# Patient Record
Sex: Male | Born: 1950
Health system: Southern US, Community
[De-identification: ages and names within clinical notes are randomized; demographics above are authoritative.]

## PROBLEM LIST (undated history)

## (undated) DIAGNOSIS — A539 Syphilis, unspecified: Secondary | ICD-10-CM

## (undated) DIAGNOSIS — I10 Essential (primary) hypertension: Secondary | ICD-10-CM

## (undated) HISTORY — PX: BACK SURGERY: SHX140

## (undated) HISTORY — DX: Syphilis, unspecified: A53.9

## (undated) HISTORY — DX: Essential (primary) hypertension: I10

---

## 1998-07-05 ENCOUNTER — Encounter: Payer: Self-pay | Admitting: Neurosurgery

## 1998-07-05 ENCOUNTER — Inpatient Hospital Stay (HOSPITAL_COMMUNITY): Admission: RE | Admit: 1998-07-05 | Discharge: 1998-07-06 | Payer: Self-pay | Admitting: Neurosurgery

## 2004-02-23 ENCOUNTER — Other Ambulatory Visit: Payer: Self-pay

## 2006-03-18 ENCOUNTER — Ambulatory Visit: Payer: Self-pay | Admitting: Pain Medicine

## 2006-05-13 ENCOUNTER — Encounter: Admission: RE | Admit: 2006-05-13 | Discharge: 2006-05-13 | Payer: Self-pay | Admitting: Neurosurgery

## 2007-04-28 ENCOUNTER — Emergency Department (HOSPITAL_COMMUNITY): Admission: EM | Admit: 2007-04-28 | Discharge: 2007-04-29 | Payer: Self-pay | Admitting: Emergency Medicine

## 2007-10-20 ENCOUNTER — Inpatient Hospital Stay (HOSPITAL_COMMUNITY): Admission: RE | Admit: 2007-10-20 | Discharge: 2007-10-26 | Payer: Self-pay | Admitting: Neurosurgery

## 2008-01-15 ENCOUNTER — Emergency Department (HOSPITAL_COMMUNITY): Admission: EM | Admit: 2008-01-15 | Discharge: 2008-01-15 | Payer: Self-pay | Admitting: Emergency Medicine

## 2008-11-28 IMAGING — RF DG LUMBAR SPINE 2-3V
1 series · 4 of 4 positions shown · non-contrast
Comparison: CT lumbar spine 05/13/2006

CLINICAL DATA: Lumbar fusion

LUMBAR SPINE - 2-3 VIEW

[Series 1: run · 4 of 4 slices shown]
[im 1/4]
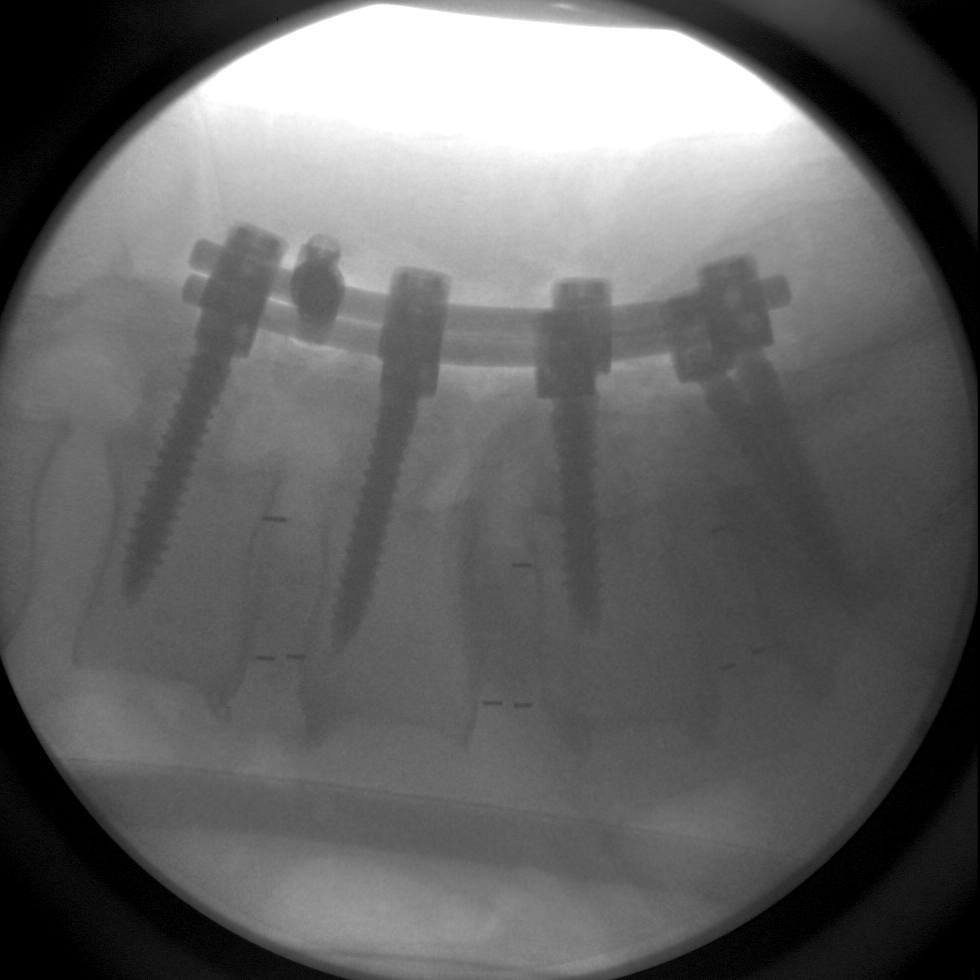
[im 2/4]
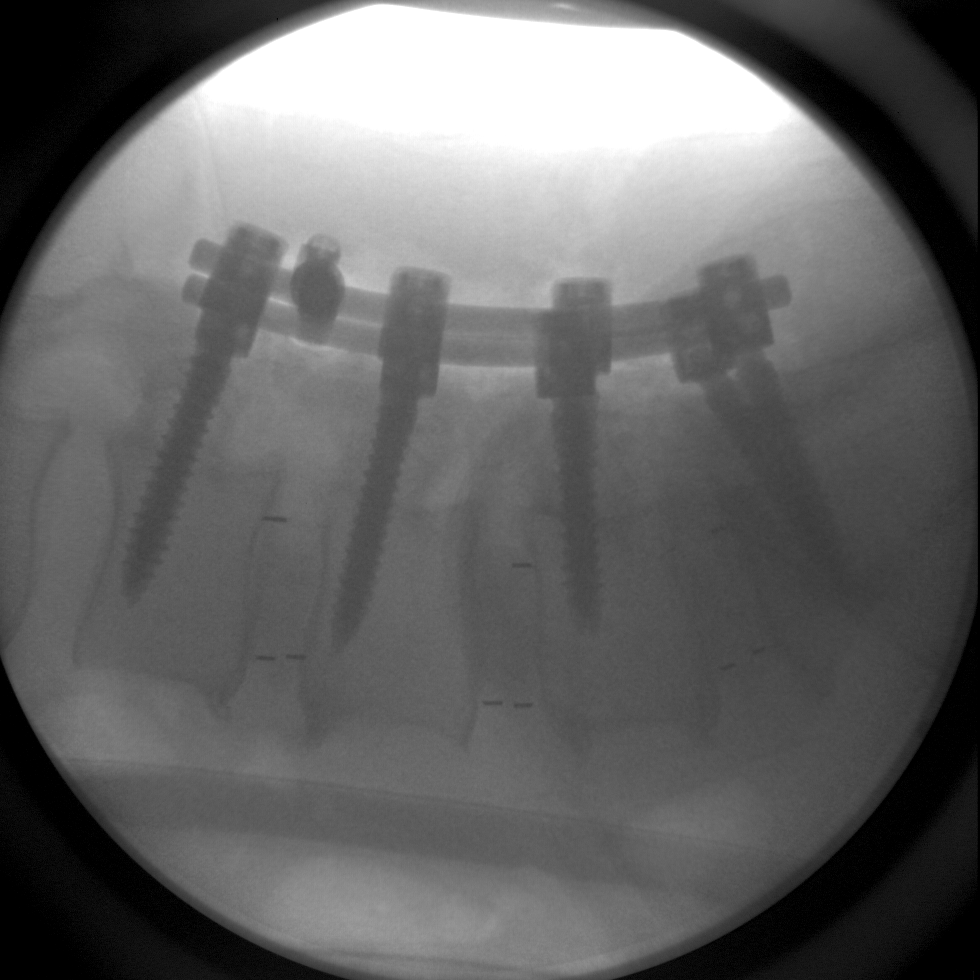
[im 3/4]
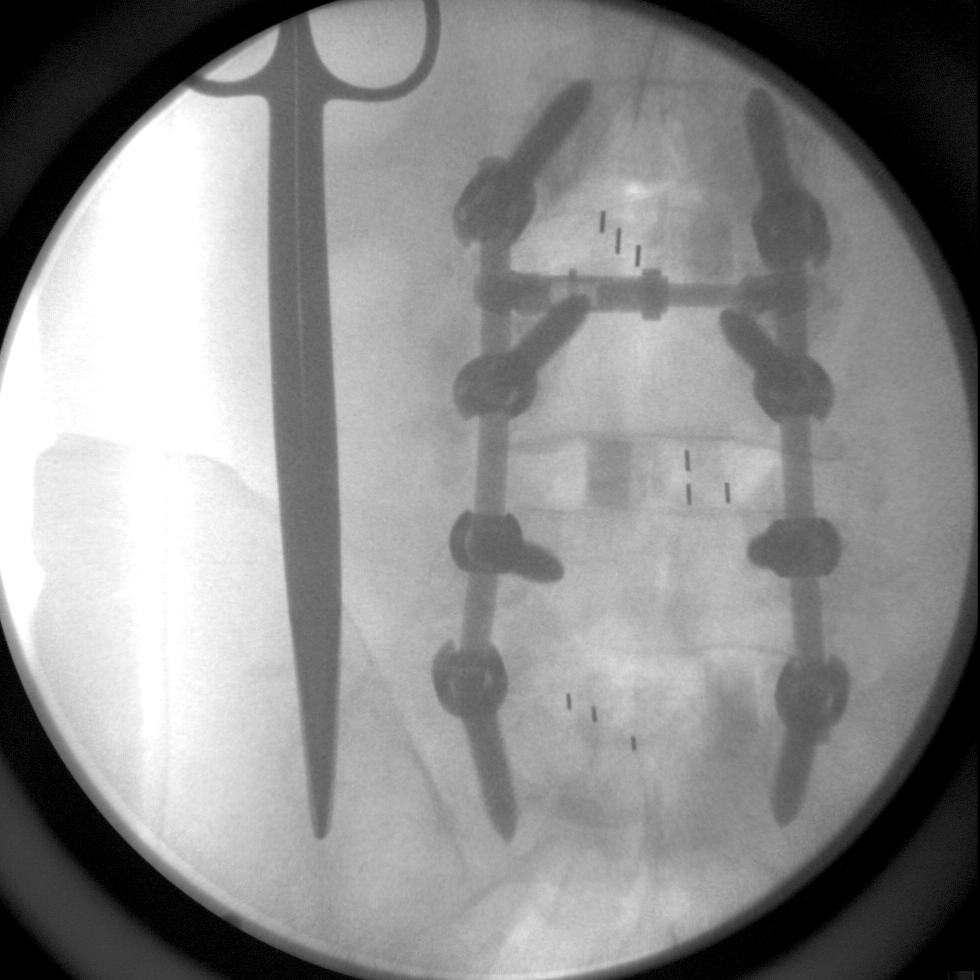
[im 4/4]
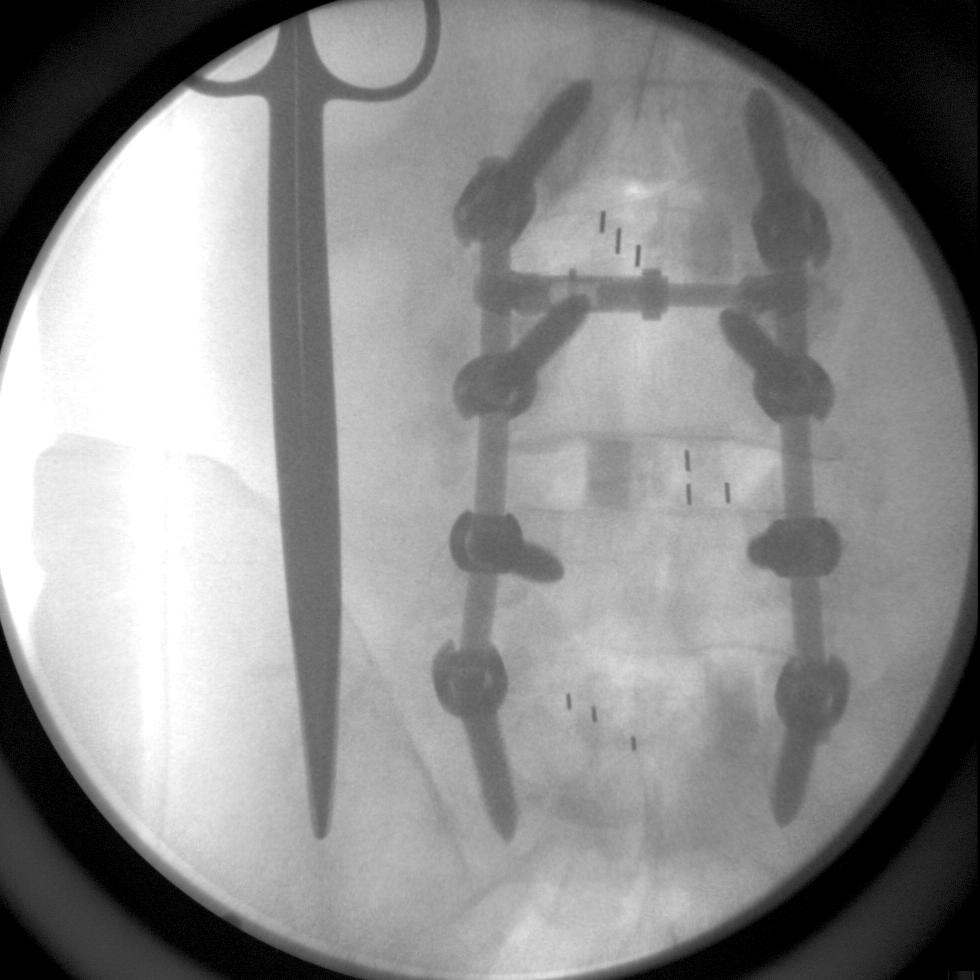

[4 of 4 positions shown; findings below may reference images not displayed]

FINDINGS: There are pedicle screws and posterior rods along with
interbody bone spacers fusing L3-S1.  Normal alignment and no
complicating features.
IMPRESSION: 1.  L3 to S1 fusion.

## 2010-10-09 NOTE — Op Note (Signed)
NAME:  Brandon [name], Brandon [name] NO.:  [id number]   MEDICAL RECORD NO.:  [id number]          PATIENT TYPE:  INP   LOCATION:  3014                         FACILITY:  [facility]   PHYSICIAN:  Kathaleen Maser. Pool, M.D.    DATE OF BIRTH:  [birth year]   DATE OF PROCEDURE:  10/20/2007  DATE OF DISCHARGE:                               OPERATIVE REPORT   PREOPERATIVE DIAGNOSES:  1. L3-4, L4-5, L5-S1 degenerative disk disease with intractable back      pain.  2. Foraminal stenosis.   POSTOPERATIVE DIAGNOSES:  1. L3-4, L4-5, L5-S1 degenerative disk disease with intractable back      pain.  2. Foraminal stenosis.   PROCEDURES:  1. Redo L3-4 decompressive laminectomy and foraminotomies, more than      would be required for simple interbody fusion alone.  2. L4-5 and L5-S1 decompressive laminectomies and foraminotomies, more      than would be required for simple interbody fusion alone.  3. L3-4, L4-5 and L5-S1 posterior lumbar fusion of the Tangent      interbody allograft wedge, interbody PEEK cage and local      autografting.  4. L3-S1 posterolateral arthrodesis utilizing segmental pedicle screw      instrumentation and local autografting.   SURGEON:  Kathaleen Maser. Pool, MD   ASSISTANT:  Reinaldo Meeker, MD   ANESTHESIA:  General endotracheal.   INDICATIONS:  Mr. Cloward is a 60 year old male with history of severe  back and bilateral lower extremity pain failing all conservative  management.  Workup demonstrates evidence of previous surgery on the  patient's left side at L3-4.  The patient has marked disk degeneration  with a central disk herniation at L4-5 and marked disk degeneration and  foraminal stenosis at L4-5 and L5-S1.  The patient has had provocative  diskography which caused at both the L4-5 and L5-S1 levels.  The patient  presents now for L3-S1 decompression and fusion for instrumentation.   OPERATIVE NOTE:  The patient was taken to the operating room, placed on  the  operating table in supine position.  After an adequate level of  anesthesia achieved, the patient placed prone onto Wilson frame.  Appropriately padded the patient's lumbar region was prepped and draped  sterilely.  A 10 blade was used to make a curvilinear skin incision  extending from L2 down to the sacrum.  This was carried down sharply in  midline.  Subperiosteal dissection was then performed exposing the  lamina and facet joints of L2, L3, L4, L5 and the sacrum, as well as the  transverse processes of L3, L4 and L5 and the sacral ala bilaterally.  Deep self-retaining retractor was placed.  Intraoperative fluoroscopy  was used and levels were confirmed.  Decompressive laminectomy was then  performed using the high-speed drill, Kerrison rongeurs and the Leksell  rongeur to completely remove the lamina of L3, completely remove the  lamina of L4 and completely remove the lamina of L5.  Inferior  facetectomies were performed bilaterally at L3, L4 and L5 bilaterally.  Superior facetectomies were performed bilaterally at L4, L5 and S1  bilaterally.  All  bone was cleaned and used in later autografting.  Ligamentum flavum and epidural scar were then resected in piecemeal  fashion.  Wide decompressive foraminotomies were then performed along  the course of exiting L3, L4, L5 and S1 nerve roots bilaterally.  At  this point, a very thorough incision had been achieved.  There was no  evidence of residual foraminal stenosis.  Epidural venous plexus  coagulated and cut.  Starting first, the patient's left side at L3-4,  the patient's thecal sac and nerve roots were gently mobilized and  tracked towards midline.  Epidural scar was dissected free.  Diskectomy  was then performed using pituitary rongeurs, upbiting pituitary rongeurs  and Epstein curettes.  Procedure was then repeated on the contralateral  side and then repeated at L4-5 and L5-S1 without problem.  Disk space  distractor was then placed  at the L3-4 level on the patient's right  side.  A 10 mm distractor was placed.  Disk space was then reamed on the  contralateral side using a 10 mm tangent box cutter and a 10 mm tangent  chisel.  Soft tissues removed from the interspace.  A 10 x 26 mm tangent  wedge was then packed into place and recessed approximately 2 mm from  posterior cortical margin of L3.  The distractor was removed from the  patient's left side.  Thecal sac nerve was inspected on the right side.  Disk space was then reamed and cut with 10 mm  tangent instrument.  Soft  tissues then removed from the interspace and disk space was further  curettaged.  Morselized autografts were impacted in for later fusion.  A  10 x 22 mm Telamon cage was then impacted into place.  This cage was  packed with morselized autograft and Progenix Putty .  This cage was  then recessed proximally 2 mm from the posterior cortical margin.  The  procedure was then repeated at L4-5 and L5-S1 again without  complications.  Pedicle was then identified using surface landmarks with  intraoperative fluoroscopy.  The superficial bone overlying the pedicle  was then removed using high-speed drill.  Each pedicle was then probed  using pedicle awl.  The pedicle awl track was then tapped with 5.25-mm  screw tapper.  Each screw tap hole was probed and found to be solid  bone.  A 6.75 x 45 mm radius screws were placed bilaterally at L3-L4, a  6.75 x 40 mm screws were placed bilaterally at L5-S1.  All screws were  well positioned by intraoperative fluoroscopy in transverse processes  and sacral ala, then decorticated with high speed drill.  Morselized  autograft was packed posterolaterally for later fusion bilaterally.  Short segment titanium rods were then contoured and placed over the  screw heads at L3, L4, L5 and S1.  Locking caps were placed over the  screw heads.  Locking caps were then engaged with construct under  compression.  Final images  revealed good position of the bone grafts and  hardware with proper operative level and normal alignment of the spine.  A transverse connector was also placed.  Wound was irrigated one final  time.  Gelfoam was placed topically and hemostasis was found be good.  A  medium Hemovac drain was left in interspace.  The wound was then closed  in layers with Vicryl suture.  Steri-Strips and sterile dressings were  applied.  There were no complications.  The patient tolerated the  procedure well and he returned to  the recovery room postoperatively.           ______________________________  Kathaleen Maser Pool, M.D.     HAP/MEDQ  D:  10/20/2007  T:  10/21/2007  Job:  [id number]

## 2010-10-12 NOTE — Discharge Summary (Signed)
NAME:  Brandon [name], Brandon [name] NO.:  [id number]   MEDICAL RECORD NO.:  [id number]          PATIENT TYPE:  INP   LOCATION:  3014                         FACILITY:  [facility]   PHYSICIAN:  Kathaleen Maser. Pool, M.D.    DATE OF BIRTH:  31-Dec-1950   DATE OF ADMISSION:  10/20/2007  DATE OF DISCHARGE:  10/26/2007                               DISCHARGE SUMMARY   FINAL DIAGNOSIS:  L3-4, L4-5, and L5-S1 degenerative disk disease with  stenosis.   OPERATION AND TREATMENTS:  L3 through S1 decompression and fusion  instrumentation.   HOSPITAL COURSE:  The patient was admitted to the hospital for treatment  of his degenerative disk disease and stenosis from L3 to S1.  The  patient underwent uncomplicated decompression and fusion surgery.  Postoperative pain was significant.  Neurological exam was intact.  The  patient was slowly mobilized.  At the time of discharge, the patient is  up ambulating without difficulty.  His preoperative pain is improved.  His neurological exam is intact.  Wound is healing well.  He will be  discharged home.  He will follow up my office in 1 week.           ______________________________  Kathaleen Maser. Pool, M.D.     HAP/MEDQ  D:  11/17/2007  T:  11/18/2007  Job:  [id number]

## 2011-02-20 LAB — CBC
HCT: 44.1
Hemoglobin: 14.6
Platelets: 236
WBC: 6.2

## 2011-02-20 LAB — TYPE AND SCREEN: ABO/RH(D): O POS

## 2011-02-20 LAB — DIFFERENTIAL
Eosinophils Relative: 1
Lymphocytes Relative: 33
Lymphs Abs: 2.1

## 2011-02-20 LAB — ABO/RH: ABO/RH(D): O POS

## 2011-03-04 LAB — I-STAT 8, (EC8 V) (CONVERTED LAB)
BUN: 11
Chloride: 110
HCT: 40
Hemoglobin: 13.6
Operator id: 282201
Potassium: 4.5
Sodium: 141

## 2011-03-04 LAB — POCT I-STAT CREATININE
Creatinine, Ser: 1.1
Operator id: 282201

## 2011-03-04 LAB — DIFFERENTIAL
Eosinophils Absolute: 0.1 — ABNORMAL LOW
Eosinophils Relative: 1
Lymphs Abs: 1.7
Monocytes Absolute: 0.6
Monocytes Relative: 9

## 2011-03-04 LAB — POCT CARDIAC MARKERS: Operator id: 282201

## 2011-03-04 LAB — CBC
HCT: 37.5 — ABNORMAL LOW
MCV: 89.4
Platelets: 240
WBC: 6.4

## 2011-12-12 ENCOUNTER — Other Ambulatory Visit (HOSPITAL_COMMUNITY): Payer: Self-pay | Admitting: Family Medicine

## 2011-12-12 ENCOUNTER — Ambulatory Visit (HOSPITAL_COMMUNITY)
Admission: RE | Admit: 2011-12-12 | Discharge: 2011-12-12 | Disposition: A | Discharge status: Home / Self Care | Payer: Medicare Other | Source: Ambulatory Visit | Attending: Family Medicine | Admitting: Family Medicine

## 2011-12-12 DIAGNOSIS — F17200 Nicotine dependence, unspecified, uncomplicated: Secondary | ICD-10-CM

## 2011-12-12 DIAGNOSIS — I10 Essential (primary) hypertension: Secondary | ICD-10-CM | POA: Insufficient documentation

## 2011-12-18 ENCOUNTER — Encounter (INDEPENDENT_AMBULATORY_CARE_PROVIDER_SITE_OTHER): Payer: Self-pay | Admitting: *Deleted

## 2011-12-30 ENCOUNTER — Encounter (INDEPENDENT_AMBULATORY_CARE_PROVIDER_SITE_OTHER): Payer: Self-pay | Admitting: Internal Medicine

## 2011-12-30 ENCOUNTER — Other Ambulatory Visit (INDEPENDENT_AMBULATORY_CARE_PROVIDER_SITE_OTHER): Payer: Self-pay | Admitting: *Deleted

## 2011-12-30 ENCOUNTER — Ambulatory Visit (INDEPENDENT_AMBULATORY_CARE_PROVIDER_SITE_OTHER): Payer: Medicare Other | Admitting: Internal Medicine

## 2011-12-30 ENCOUNTER — Telehealth (INDEPENDENT_AMBULATORY_CARE_PROVIDER_SITE_OTHER): Payer: Self-pay | Admitting: *Deleted

## 2011-12-30 VITALS — BP 110/80 | HR 72 | Temp 98.4°F | Ht 76.0 in | Wt 224.2 lb

## 2011-12-30 DIAGNOSIS — K625 Hemorrhage of anus and rectum: Secondary | ICD-10-CM

## 2011-12-30 DIAGNOSIS — Z1211 Encounter for screening for malignant neoplasm of colon: Secondary | ICD-10-CM

## 2011-12-30 MED ORDER — PEG-KCL-NACL-NASULF-NA ASC-C 100 G PO SOLR: 1.0000 | Freq: Once | ORAL | Status: DC

## 2011-12-30 NOTE — Progress Notes (Signed)
Subjective:     Patient ID: Brandon [name], male   DOB: Jul 31, 1950, 61 y.o.   MRN: [id number]  HPI Referral from Dr. Renard Matter for rectal bleeding/colonoscopy.  He has had rectal bleeding all his life. He says sometimes the bleeding is heavy and sometimes it not. He sees blood in his stool and on the toilet tissue. Blood is bright red. He says Dr. Renard Matter did a rectal and he was positive for blood.  There has been no weight loss. Appetite is good. No abdominal pain.  He has a BM x 1 a day. Stools are brown and normal caliber. Sometimes he has pain with his BMs.  Sometimes his stools are hard. He has never undergone a colonoscopy. 11/30/2011: H and H 15.4 and 45.0, Platelet ct 264, K 5.1, NA 142, Chloride 103, glucose 84, BUN 6, Creatinine 1.03, ALP 97, AT 20, ALT 20, Total protein 7.3, Albumin 4.2. Direct bili 0.1, Indirect 0.1.  Review of Systems see hpi No current outpatient prescriptions on file.   History   Social History  . Marital Status: Married    Spouse Name: N/A    Number of Children: N/A  . Years of Education: N/A   Occupational History  . Not on file.   Social History Main Topics  . Smoking status: Current Everyday Smoker  . Smokeless tobacco: Not on file   Comment: 1 pack a day  . Alcohol Use: No  . Drug Use: No  . Sexually Active: Not on file   Other Topics Concern  . Not on file   Social History Narrative  . No narrative on file   History reviewed. No pertinent past medical history. Family Status  Relation Status Death Age  . Mother Alive     amputee, dialysis, cancer  . Father Deceased     MI  . Brother Alive     HTN,  one had syphyllis   No Known Allergies      Objective:   Physical ExamAlert and oriented. Skin warm and dry. Oral mucosa is moist.   . Sclera anicteric, conjunctivae is pink. Thyroid not enlarged. No cervical lymphadenopathy. Lungs clear. Heart regular rate and rhythm.  Abdomen is soft. Bowel sounds are positive. No hepatomegaly. No  abdominal masses felt. No tenderness.  No edema to lower extremities.  Stool negative for blood. Filed Vitals:   12/30/11 1525  Height: 6\' 4"  (1.93 m)  Weight: 224 lb 3.2 oz (101.696 kg)       Assessment:    Rectal bleeding.  Colonic neoplasm needs to be ruled out as well as polyps.    Plan:   Colonoscopy.The risks and benefits such as perforation, bleeding, and infection were reviewed with the patient and is agreeable.

## 2011-12-30 NOTE — Telephone Encounter (Signed)
Patient needs movi prep 

## 2011-12-30 NOTE — Patient Instructions (Addendum)
Colonoscopy with Dr. Rehman. The risks and benefits such as perforation, bleeding, and infection were reviewed with the patient and is agreeable. 

## 2012-01-07 ENCOUNTER — Encounter (HOSPITAL_COMMUNITY): Payer: Self-pay | Admitting: Pharmacy Technician

## 2012-01-21 MED ORDER — SODIUM CHLORIDE 0.45 % IV SOLN
Freq: Once | INTRAVENOUS | Status: AC
  Administered 2012-01-22: 13:00:00 via INTRAVENOUS

## 2012-01-22 ENCOUNTER — Encounter (HOSPITAL_COMMUNITY)
Admission: RE | Disposition: A | Discharge status: Home / Self Care | Payer: Self-pay | Source: Ambulatory Visit | Attending: Internal Medicine

## 2012-01-22 ENCOUNTER — Ambulatory Visit (HOSPITAL_COMMUNITY)
Admission: RE | Admit: 2012-01-22 | Discharge: 2012-01-22 | Disposition: A | Discharge status: Home / Self Care | Payer: Medicare Other | Source: Ambulatory Visit | Attending: Internal Medicine | Admitting: Internal Medicine

## 2012-01-22 ENCOUNTER — Encounter (HOSPITAL_COMMUNITY): Payer: Self-pay | Admitting: *Deleted

## 2012-01-22 DIAGNOSIS — K644 Residual hemorrhoidal skin tags: Secondary | ICD-10-CM | POA: Insufficient documentation

## 2012-01-22 DIAGNOSIS — K921 Melena: Secondary | ICD-10-CM | POA: Insufficient documentation

## 2012-01-22 DIAGNOSIS — K625 Hemorrhage of anus and rectum: Secondary | ICD-10-CM

## 2012-01-22 DIAGNOSIS — K648 Other hemorrhoids: Secondary | ICD-10-CM

## 2012-01-22 HISTORY — PX: COLONOSCOPY: SHX5424

## 2012-01-22 SURGERY — COLONOSCOPY
Anesthesia: Moderate Sedation

## 2012-01-22 MED ORDER — MEPERIDINE HCL 50 MG/ML IJ SOLN
INTRAMUSCULAR | Status: AC
  Filled 2012-01-22: qty 1

## 2012-01-22 MED ORDER — PSYLLIUM 28 % PO PACK: 1.0000 | PACK | Freq: Every day | ORAL | Status: AC

## 2012-01-22 MED ORDER — MEPERIDINE HCL 50 MG/ML IJ SOLN
INTRAMUSCULAR | Status: DC | PRN
  Administered 2012-01-22 (×2): 25 mg via INTRAVENOUS

## 2012-01-22 MED ORDER — MIDAZOLAM HCL 5 MG/5ML IJ SOLN
INTRAMUSCULAR | Status: DC | PRN
  Administered 2012-01-22: 2 mg via INTRAVENOUS
  Administered 2012-01-22: 1 mg via INTRAVENOUS
  Administered 2012-01-22: 2 mg via INTRAVENOUS

## 2012-01-22 MED ORDER — HYDROCORTISONE ACETATE 25 MG RE SUPP: 25.0000 mg | Freq: Every day | RECTAL | Status: AC

## 2012-01-22 MED ORDER — MIDAZOLAM HCL 5 MG/5ML IJ SOLN
INTRAMUSCULAR | Status: AC
  Filled 2012-01-22: qty 10

## 2012-01-22 MED ORDER — STERILE WATER FOR IRRIGATION IR SOLN
Status: DC | PRN
  Administered 2012-01-22: 14:00:00

## 2012-01-22 NOTE — Op Note (Signed)
COLONOSCOPY PROCEDURE REPORT  PATIENT:  Brandon [name]  MR#:  [id number] Birthdate:  [birth year], 61 y.o., male Endoscopist:  Dr. Malissa Hippo, MD Referred By:  Dr. Alice Reichert, MD Procedure Date: 01/22/2012  Procedure:   Colonoscopy  Indications: Patient is 61 year old African American male who has  been experiencing intermittent rectal bleeding for the last 6 months.  Informed Consent:  The procedure and risks were reviewed with the patient and informed consent was obtained.  Medications:  Demerol 50 mg IV Versed 5 mg IV  Description of procedure:  After a digital rectal exam was performed, that colonoscope was advanced from the anus through the rectum and colon to the area of the cecum, ileocecal valve and appendiceal orifice. The cecum was deeply intubated. These structures were well-seen and photographed for the record. From the level of the cecum and ileocecal valve, the scope was slowly and cautiously withdrawn. The mucosal surfaces were carefully surveyed utilizing scope tip to flexion to facilitate fold flattening as needed. The scope was pulled down into the rectum where a thorough exam including retroflexion was performed.  Findings:   Prep excellent. Normal mucosa throughout. Normal rectal mucosa. Hemorrhoids above and below the dentate line.  Therapeutic/Diagnostic Maneuvers Performed:  None  Complications:  None  Cecal Withdrawal Time:  21  minutes  Impression:  Normal colonoscopy except internal/external hemorrhoids felt to be source of patient's recurrent rectal bleeding.   Recommendations:  High fiber diet. Metamucil one tablespoonful by mouth daily. Anusol-HC suppository 1 per rectum daily at bedtime for two weeks. Office visit in 3 months   REHMAN,NAJEEB U  01/22/2012 2:26 PM  CC: Dr. Alice Reichert, MD & Dr. Bonnetta Barry ref. provider found

## 2012-01-22 NOTE — H&P (Signed)
Brandon [name] is an 61 y.o. male.   Chief Complaint: Patient is here for colonoscopy. HPI: Patient is 61 year old African male who's been having intermittent rectal bleeding for about 6 months. He passes blood mostly with his bowel movements at times without. He denies diarrhea constipation or abdominal pain. He has good appetite and denies weight loss. Family history is negative for colorectal carcinoma to  Past Medical History  Diagnosis Date  . No pertinent past medical history     Past Surgical History  Procedure Date  . Back surgery     4 yrs ago    Family History  Problem Relation Age of Onset  . Colon cancer Neg Hx    Social History:  reports that he has been smoking Cigarettes.  He has a 45 pack-year smoking history. He does not have any smokeless tobacco history on file. He reports that he does not drink alcohol or use illicit drugs.  Allergies: No Known Allergies  Medications Prior to Admission  Medication Sig Dispense Refill  . peg 3350 powder (MOVIPREP) 100 G SOLR Take 1 kit (100 g total) by mouth once.  1 kit  0    No results found for this or any previous visit (from the past 48 hour(s)). No results found.  ROS  Blood pressure 140/87, pulse 67, temperature 98.4 F (36.9 C), temperature source Oral, resp. rate 19, height 6\' 4"  (1.93 m), weight 224 lb (101.606 kg), SpO2 94.00%. Physical Exam  Constitutional: He appears well-developed and well-nourished.  HENT:  Mouth/Throat: Oropharynx is clear and moist.  Eyes: Conjunctivae are normal. No scleral icterus.  Neck: No thyromegaly present.  Cardiovascular: Normal rate, regular rhythm and normal heart sounds.   No murmur heard. Respiratory: Effort normal.  GI: Soft. He exhibits no distension and no mass. There is no tenderness.  Musculoskeletal: He exhibits no edema.  Lymphadenopathy:    He has no cervical adenopathy.  Neurological: He is alert.  Skin: Skin is warm and dry.      Assessment/Plan Rectal bleeding. Diagnostic colonoscopy.  Elva Breaker U 01/22/2012, 1:42 PM

## 2012-01-24 ENCOUNTER — Encounter (HOSPITAL_COMMUNITY): Payer: Self-pay | Admitting: Internal Medicine

## 2012-01-31 ENCOUNTER — Encounter (INDEPENDENT_AMBULATORY_CARE_PROVIDER_SITE_OTHER): Payer: Self-pay

## 2012-04-20 ENCOUNTER — Ambulatory Visit (INDEPENDENT_AMBULATORY_CARE_PROVIDER_SITE_OTHER): Payer: Medicare Other | Admitting: Internal Medicine

## 2017-08-14 ENCOUNTER — Encounter: Payer: Self-pay | Admitting: Family Medicine

## 2017-08-14 ENCOUNTER — Other Ambulatory Visit (HOSPITAL_COMMUNITY)
Admission: RE | Admit: 2017-08-14 | Discharge: 2017-08-14 | Disposition: A | Discharge status: Home / Self Care | Payer: Medicare Other | Source: Ambulatory Visit | Attending: Family Medicine | Admitting: Family Medicine

## 2017-08-14 ENCOUNTER — Other Ambulatory Visit: Payer: Self-pay

## 2017-08-14 ENCOUNTER — Ambulatory Visit (INDEPENDENT_AMBULATORY_CARE_PROVIDER_SITE_OTHER): Payer: Medicare Other | Admitting: Family Medicine

## 2017-08-14 VITALS — BP 150/94 | HR 69 | Temp 97.9°F | Resp 16 | Ht 76.0 in | Wt 225.2 lb

## 2017-08-14 DIAGNOSIS — Z1211 Encounter for screening for malignant neoplasm of colon: Secondary | ICD-10-CM

## 2017-08-14 DIAGNOSIS — Z23 Encounter for immunization: Secondary | ICD-10-CM

## 2017-08-14 DIAGNOSIS — R634 Abnormal weight loss: Secondary | ICD-10-CM

## 2017-08-14 DIAGNOSIS — R030 Elevated blood-pressure reading, without diagnosis of hypertension: Secondary | ICD-10-CM

## 2017-08-14 DIAGNOSIS — R399 Unspecified symptoms and signs involving the genitourinary system: Secondary | ICD-10-CM | POA: Insufficient documentation

## 2017-08-14 DIAGNOSIS — Z113 Encounter for screening for infections with a predominantly sexual mode of transmission: Secondary | ICD-10-CM

## 2017-08-14 DIAGNOSIS — Z720 Tobacco use: Secondary | ICD-10-CM

## 2017-08-14 DIAGNOSIS — R630 Anorexia: Secondary | ICD-10-CM

## 2017-08-14 DIAGNOSIS — R5383 Other fatigue: Secondary | ICD-10-CM | POA: Diagnosis not present

## 2017-08-14 DIAGNOSIS — R6881 Early satiety: Secondary | ICD-10-CM | POA: Diagnosis not present

## 2017-08-14 DIAGNOSIS — Z125 Encounter for screening for malignant neoplasm of prostate: Secondary | ICD-10-CM | POA: Diagnosis not present

## 2017-08-14 NOTE — Patient Instructions (Signed)
Get your labs done High BP is BP greater than 140/90-Call if elevated on consecutive days Check BP and bring readings and cuff to your next visit. Follow up in 2 weeks. Cut down on your salt intake DASH Eating Plan DASH stands for "Dietary Approaches to Stop Hypertension." The DASH eating plan is a healthy eating plan that has been shown to reduce high blood pressure (hypertension). It may also reduce your risk for type 2 diabetes, heart disease, and stroke. The DASH eating plan may also help with weight loss. What are tips for following this plan? General guidelines  Avoid eating more than 2,300 mg (milligrams) of salt (sodium) a day. If you have hypertension, you may need to reduce your sodium intake to 1,500 mg a day.  Limit alcohol intake to no more than 1 drink a day for nonpregnant women and 2 drinks a day for men. One drink equals 12 oz of beer, 5 oz of wine, or 1 oz of hard liquor.  Work with your health care provider to maintain a healthy body weight or to lose weight. Ask what an ideal weight is for you.  Get at least 30 minutes of exercise that causes your heart to beat faster (aerobic exercise) most days of the week. Activities may include walking, swimming, or biking.  Work with your health care provider or diet and nutrition specialist (dietitian) to adjust your eating plan to your individual calorie needs. Reading food labels  Check food labels for the amount of sodium per serving. Choose foods with less than 5 percent of the Daily Value of sodium. Generally, foods with less than 300 mg of sodium per serving fit into this eating plan.  To find whole grains, look for the word "whole" as the first word in the ingredient list. Shopping  Buy products labeled as "low-sodium" or "no salt added."  Buy fresh foods. Avoid canned foods and premade or frozen meals. Cooking  Avoid adding salt when cooking. Use salt-free seasonings or herbs instead of table salt or sea salt. Check  with your health care provider or pharmacist before using salt substitutes.  Do not fry foods. Cook foods using healthy methods such as baking, boiling, grilling, and broiling instead.  Cook with heart-healthy oils, such as olive, canola, soybean, or sunflower oil. Meal planning   Eat a balanced diet that includes: ? 5 or more servings of fruits and vegetables each day. At each meal, try to fill half of your plate with fruits and vegetables. ? Up to 6-8 servings of whole grains each day. ? Less than 6 oz of lean meat, poultry, or fish each day. A 3-oz serving of meat is about the same size as a deck of cards. One egg equals 1 oz. ? 2 servings of low-fat dairy each day. ? A serving of nuts, seeds, or beans 5 times each week. ? Heart-healthy fats. Healthy fats called Omega-3 fatty acids are found in foods such as flaxseeds and coldwater fish, like sardines, salmon, and mackerel.  Limit how much you eat of the following: ? Canned or prepackaged foods. ? Food that is high in trans fat, such as fried foods. ? Food that is high in saturated fat, such as fatty meat. ? Sweets, desserts, sugary drinks, and other foods with added sugar. ? Full-fat dairy products.  Do not salt foods before eating.  Try to eat at least 2 vegetarian meals each week.  Eat more home-cooked food and less restaurant, buffet, and fast food.  When eating at a restaurant, ask that your food be prepared with less salt or no salt, if possible. What foods are recommended? The items listed may not be a complete list. Talk with your dietitian about what dietary choices are best for you. Grains Whole-grain or whole-wheat bread. Whole-grain or whole-wheat pasta. Brown rice. [place]. Bulgur. Whole-grain and low-sodium cereals. Pita bread. Low-fat, low-sodium crackers. Whole-wheat flour tortillas. Vegetables Fresh or frozen vegetables (raw, steamed, roasted, or grilled). Low-sodium or reduced-sodium tomato and  vegetable juice. Low-sodium or reduced-sodium tomato sauce and tomato paste. Low-sodium or reduced-sodium canned vegetables. Fruits All fresh, dried, or frozen fruit. Canned fruit in natural juice (without added sugar). Meat and other protein foods Skinless chicken or [place]. Ground chicken or [place]. Pork with fat trimmed off. Fish and seafood. Egg whites. Dried beans, peas, or lentils. Unsalted nuts, nut butters, and seeds. Unsalted canned beans. Lean cuts of beef with fat trimmed off. Low-sodium, lean deli meat. Dairy Low-fat (1%) or fat-free (skim) milk. Fat-free, low-fat, or reduced-fat cheeses. Nonfat, low-sodium ricotta or cottage cheese. Low-fat or nonfat yogurt. Low-fat, low-sodium cheese. Fats and oils Soft margarine without trans fats. Vegetable oil. Low-fat, reduced-fat, or light mayonnaise and salad dressings (reduced-sodium). Canola, safflower, olive, soybean, and sunflower oils. Avocado. Seasoning and other foods Herbs. Spices. Seasoning mixes without salt. Unsalted popcorn and pretzels. Fat-free sweets. What foods are not recommended? The items listed may not be a complete list. Talk with your dietitian about what dietary choices are best for you. Grains Baked goods made with fat, such as croissants, muffins, or some breads. Dry pasta or rice meal packs. Vegetables Creamed or fried vegetables. Vegetables in a cheese sauce. Regular canned vegetables (not low-sodium or reduced-sodium). Regular canned tomato sauce and paste (not low-sodium or reduced-sodium). Regular tomato and vegetable juice (not low-sodium or reduced-sodium). Angie Fava. Olives. Fruits Canned fruit in a light or heavy syrup. Fried fruit. Fruit in cream or butter sauce. Meat and other protein foods Fatty cuts of meat. Ribs. Fried meat. Berniece Salines. Sausage. Bologna and other processed lunch meats. Salami. Fatback. Hotdogs. Bratwurst. Salted nuts and seeds. Canned beans with added salt. Canned or smoked fish. Whole eggs or  egg yolks. Chicken or [place] with skin. Dairy Whole or 2% milk, cream, and half-and-half. Whole or full-fat cream cheese. Whole-fat or sweetened yogurt. Full-fat cheese. Nondairy creamers. Whipped toppings. Processed cheese and cheese spreads. Fats and oils Butter. Stick margarine. Lard. Shortening. Ghee. Bacon fat. Tropical oils, such as coconut, palm kernel, or palm oil. Seasoning and other foods Salted popcorn and pretzels. Onion salt, garlic salt, seasoned salt, table salt, and sea salt. Worcestershire sauce. Tartar sauce. Barbecue sauce. Teriyaki sauce. Soy sauce, including reduced-sodium. Steak sauce. Canned and packaged gravies. Fish sauce. Oyster sauce. Cocktail sauce. Horseradish that you find on the shelf. Ketchup. Mustard. Meat flavorings and tenderizers. Bouillon cubes. Hot sauce and Tabasco sauce. Premade or packaged marinades. Premade or packaged taco seasonings. Relishes. Regular salad dressings. Where to find more information:  National Heart, Lung, and [place]: https://wilson-eaton.com/  American Heart Association: www.heart.org Summary  The DASH eating plan is a healthy eating plan that has been shown to reduce high blood pressure (hypertension). It may also reduce your risk for type 2 diabetes, heart disease, and stroke.  With the DASH eating plan, you should limit salt (sodium) intake to 2,300 mg a day. If you have hypertension, you may need to reduce your sodium intake to 1,500 mg a day.  When on the DASH eating plan,  aim to eat more fresh fruits and vegetables, whole grains, lean proteins, low-fat dairy, and heart-healthy fats.  Work with your health care provider or diet and nutrition specialist (dietitian) to adjust your eating plan to your individual calorie needs. This information is not intended to replace advice given to you by your health care provider. Make sure you discuss any questions you have with your health care provider. Document Released: 05/02/2011  Document Revised: 05/06/2016 Document Reviewed: 05/06/2016 Elsevier Interactive Patient Education  [employer].

## 2017-08-14 NOTE — Progress Notes (Signed)
Patient ID: Brandon [name], male    DOB: Jan 09, 1951, 67 y.o.   MRN: [id number]  Chief Complaint  Patient presents with  . Fatigue  . Anorexia  . Urinary Urgency    Allergies Patient has no known allergies.  Subjective:   Brandon [name] is a 67 y.o. male who presents to [facility] today.  HPI Here b/c has not felt good. Reports that has had a decreased appetite over the past six months.  He reports that he can tell he has lost weight because his pants fit different. Wife believes lost weight and friends have noticed too.  He reports that he does not feel sad, hopeless, or down.  He reports that he just feels low energy.  Reports that he does not feel depressed.  Reports that he sleeps well.  Main complaint is that his energy is low. Does not get SOB. Feels tired. 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 Used to be active and cut grass and do stuff but sits on porch now because does not feel that he has the energy.  He denies any shortness of breath or chest pain with activity.  He denies any dyspnea on exertion.  He does report of lack of motivation because of his low energy level. He reports that his appetite is low when he gets full very easily.  Reports that last night he went and got flounder with his brother and he felt like he wanted to eat but then when he starts eating eats a little bit and is very full.  He denies any abdominal pain.  Bowel movements have been normal.  Denies any melena or bright red blood per rectum.  He reports he has never had a colonoscopy but on upon review of the chart he did have a colonoscopy performed several years ago secondary to rectal bleeding. He reports that he has had some urinary urgency and daytime frequency.  No nocturia.  He reports his stream of urine is normal.  He denies any pain or problems with sexual intercourse.  He reports a good libido. He does smoke cigarettes and has smoked over a pack a day for almost 50 years.  He  denies any cough, hemoptysis, sputum production.  He reports occasionally he will cough a little in the mornings but that is only upon clearing of his throat upon awakening.  He denies any difficulty or pain with swallowing.  He reports that his voice is normal.   Past Medical History:  Diagnosis Date  . No pertinent past medical history     Past Surgical History:  Procedure Laterality Date  . BACK SURGERY     4 yrs ago  . COLONOSCOPY  01/22/2012   Procedure: COLONOSCOPY;  Surgeon: Malissa Hippo, MD;  Location: [facility];  Service: Endoscopy;  Laterality: N/A;  200    Family History  Problem Relation Age of Onset  . Diabetes Mother   . Hypertension Mother   . Breast cancer Mother   . Alcohol abuse Father   . Breast cancer Daughter   . Colon cancer Neg Hx      Social History   Socioeconomic History  . Marital status: Married    Spouse name: Not on file  . Number of children: Not on file  . Years of education: Not on file  . Highest education level: Not on file  Occupational History  . Not on file  Social Needs  . Physicist, medical  strain: Not on file  . Food insecurity:    Worry: Not on file    Inability: Not on file  . Transportation needs:    Medical: Not on file    Non-medical: Not on file  Tobacco Use  . Smoking status: Current Every Day Smoker    Packs/day: 1.00    Years: 45.00    Pack years: 45.00    Types: Cigarettes  . Smokeless tobacco: Never Used  . Tobacco comment: 1 pack a day  Substance and Sexual Activity  . Alcohol use: No  . Drug use: No  . Sexual activity: Yes    Birth control/protection: None  Lifestyle  . Physical activity:    Days per week: Not on file    Minutes per session: Not on file  . Stress: Not on file  Relationships  . Social connections:    Talks on phone: Not on file    Gets together: Not on file    Attends religious service: Not on file    Active member of club or organization: Not on file    Attends meetings of  clubs or organizations: Not on file    Relationship status: Not on file  Other Topics Concern  . Not on file  Social History Narrative   Grew up in [place], [place]. 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    Married for 17 years. Has 3 children, 11 grandchildren, 4 great grandchildren.   Wife had breast cancer and she died eight years ago. Remarried, wife is younger.    Wears seatbelt.   Eats all food groups.    Review of systems is otherwise negative unless stated above or below.  Complete review of systems performed.  No abdominal pain.  No night sweats.  Hearing is good.  No lesions in oral cavity.  No nosebleeds.  No teeth problem.  Does not go to [place].  Vision is normal.  No headaches.  No numbness or tingling in extremities.  No weakness in upper or lower extremities.  No changes in skin.  Denies mood problems.  No suicidal or homicidal ideations.  No auditory or visual hallucinations.  Patient reports some urinary urgency.  He reports that when he feels he needs to go to the bathroom he has to get there fast or he could end up urinating on himself.  He does not get up at night to go to the bathroom.  He denies any hematuria.  He denies any dysuria.  No penile discharge.  Denies penile lesions.  Reports he has never had a high blood pressure in the past and believe it is due to the fact that he is nervous about today's visit.   Objective:   BP (!) 150/94 (BP Location: Left Arm, Patient Position: Sitting, Cuff Size: Normal)   Pulse 69   Temp 97.9 F (36.6 C) (Temporal)   Resp 16   Ht 6\' 4"  (1.93 m)   Wt 225 lb 4 oz (102.2 kg)   SpO2 97%   BMI 27.42 kg/m   Physical Exam  Constitutional: He is oriented to person, place, and time. He appears well-developed and well-nourished. He is active. No distress.  HENT:  Head: Normocephalic and atraumatic. Head is without right periorbital erythema and without left periorbital erythema. Hair is normal.  Right Ear: Tympanic membrane,  external ear and ear canal normal.  Left Ear: Tympanic membrane, external ear and ear canal normal.  Nose: Nose normal. No mucosal edema,  rhinorrhea or sinus tenderness. No epistaxis. Right sinus exhibits no maxillary sinus tenderness and no frontal sinus tenderness. Left sinus exhibits no maxillary sinus tenderness and no frontal sinus tenderness.  Mouth/Throat: Uvula is midline, oropharynx is clear and moist and mucous membranes are normal. No oral lesions. No uvula swelling. No oropharyngeal exudate, posterior oropharyngeal edema or posterior oropharyngeal erythema. Tonsils are 0 on the right. Tonsils are 0 on the left. No tonsillar exudate.  Eyes: Pupils are equal, round, and reactive to light. Conjunctivae, EOM and lids are normal. No scleral icterus.  Conjunctiva slightly pale.  Neck: Trachea normal, normal range of motion, full passive range of motion without pain and phonation normal. Neck supple. Normal carotid pulses and no JVD present. No tracheal tenderness present. Carotid bruit is not present. No neck rigidity. No tracheal deviation present. No thyroid mass and no thyromegaly present.  Cardiovascular: Normal rate, regular rhythm, normal heart sounds, intact distal pulses and normal pulses.  Pulmonary/Chest: Effort normal and breath sounds normal. No accessory muscle usage or stridor. No respiratory distress. He has no wheezes. He exhibits no tenderness.  Abdominal: Soft. Bowel sounds are normal. He exhibits no distension and no mass. There is no hepatosplenomegaly. There is no tenderness. There is no guarding.  Genitourinary: Rectum normal and prostate normal. Rectal exam shows no external hemorrhoid, no fissure, no mass, no tenderness and anal tone normal. Prostate is not enlarged and not tender.  Musculoskeletal: Normal range of motion. He exhibits no edema, tenderness or deformity.  Lymphadenopathy:       Head (right side): No submental and no submandibular adenopathy present.        Head (left side): No submental and no submandibular adenopathy present.    He has no cervical adenopathy.       Right: No supraclavicular adenopathy present.       Left: No supraclavicular adenopathy present.  Neurological: He is alert and oriented to person, place, and time. He has normal strength. He displays no atrophy, no tremor and normal reflexes. No cranial nerve deficit or sensory deficit. He exhibits normal muscle tone. Coordination and gait normal.  Skin: Skin is warm, dry and intact. Capillary refill takes less than 2 seconds. No petechiae and no rash noted. He is not diaphoretic. No cyanosis. Nails show no clubbing.  Psychiatric: He has a normal mood and affect. His speech is normal and behavior is normal. Judgment and thought content normal. His mood appears not anxious. His affect is not blunt and not labile. Cognition and memory are normal. Cognition and memory are not impaired. He does not express impulsivity or inappropriate judgment. He does not exhibit a depressed mood. He expresses no homicidal and no suicidal ideation. He exhibits normal recent memory and normal remote memory.  Vitals reviewed.  Depression screen [facility] 2/9 08/14/2017 08/14/2017  Decreased Interest 3 3  Down, Depressed, Hopeless 0 3  PHQ - 2 Score 3 6  Altered sleeping 0 0  Tired, decreased energy 3 3  Change in appetite 3 3  Feeling bad or failure about yourself  0 0  Trouble concentrating 0 0  Moving slowly or fidgety/restless 0 0  Suicidal thoughts 0 0  PHQ-9 Score 9 12  Difficult doing work/chores - Somewhat difficult     Assessment and Plan  1. Weight loss Patient with history of unintentional weight loss and anorexia.  Uncertain etiology.  Weight loss is most likely secondary to inadequate calorie intake secondary to decreased appetite.  Uncertain as to why  patient has anorexia at this time. -Refer to GI at this time for evaluation.  2. Immunization due Vaccinations recommended but patient  deferred.  The following vaccines have been deferred today by patient. - Flu Vaccine QUAD 6+ mos PF IM (Fluarix Quad PF) - Td : Tetanus/diphtheria >7yo Preservative  free  4. Elevated BP without diagnosis of hypertension Review of chart does not reveal any history of elevated blood pressures.  This is the first elevated blood pressure although he has not had medical evaluation in quite some time.  Patient will follow-up in 2 weeks for recheck of blood pressure.  If elevated at that time will start blood pressure medication. Lifestyle modifications discussed with patient including a diet emphasizing vegetables, fruits, and whole grains. Limiting intake of sodium to less than 2,400 mg per day.  Recommendations discussed include consuming low-fat dairy products, poultry, fish, legumes, non-tropical vegetable oils, and nuts; and limiting intake of sweets, sugar-sweetened beverages, and red meat. Discussed following a plan such as the Dietary Approaches to Stop Hypertension (DASH) diet. Patient to read up on this diet.  He does report a history of increased salt intake and reports that he loves salty foods.  We did discuss that how salt and tobacco use did elevate blood pressure. - Hemoglobin A1c - Lipid panel  5. Tobacco abuse The 5 A's Model for treating Tobacco Use and Dependence was used today. I have identified and documented tobacco use status for this patient. I have urged the patient to quit tobacco use. At this time, the patient is unwilling and not ready to attempt to quit. I have provided patient with information regarding risks, cessation techniques, and interventions that might increase future attempts to quit smoking. I will plan on again addressing tobacco dependence at the next visit.   6. Fatigue, unspecified type Patient with low energy, weight loss, anorexia.  Check labs at this time. - COMPLETE METABOLIC PANEL WITH GFR - TSH - CBC with Differential/Platelet  7. Lower urinary  tract symptoms (LUTS) New onset urinary symptoms.  Prostate exam normal in the office today.  Will check labs and plan to follow-up in 2 weeks for review and discussion of labs and determine subsequent plan.  Consider referral to urology. - Urinalysis, Routine w reflex microscopic - Urine Culture - HIV antibody - RPR - Hepatitis panel, acute - Urine cytology ancillary only - PSA, total and free  8. Screen for STD (sexually transmitted disease) Patient with symptoms as above and like screening for any sexually transmitted infections.  Screening performed today. - HIV antibody - RPR - Hepatitis panel, acute - Urine cytology ancillary only  9. Tobacco use Secondary to history of tobacco use we will go ahead and perform low-dose CT scan of the chest. - CT CHEST LUNG CA SCREEN LOW DOSE W/O CM; Future  10. Anorexia and Early Satiety Refer to GI as discussed above. Did discuss with patient that his symptoms could be secondary to mood disorder.  However patient adamantly reports that he is a good mood and that his symptoms as reported on his PHQ are only elevated secondary to the fact that he has a low energy.  He reports that his lack of doing activities such as mowing the yard is related to an energy level that is affecting his motivation.  We will plan to discuss this in more detail. Office visit was greater than 60 minutes.  Records requested from previous PCP.  Information in chart reviewed today.  Immunization records requested.  Did have long discussion with patient regarding concerns and his risk for malignancy as a cause of his symptoms.  His main risk for malignancy at this time is his tobacco use.  Will await tests and determine need for subsequent referral/testing.  Will plan on short-term follow-up.  Patient was told in the meantime if he develops any new symptoms or concerns to please contact our office. Return in about 2 weeks (around 08/28/2017) for follow up. Aliene Beamsachel Yehonatan Grandison,  MD 08/16/2017

## 2017-08-15 LAB — HEPATITIS PANEL, ACUTE
Hep A IgM: NONREACTIVE
Hep B C IgM: NONREACTIVE
Hepatitis B Surface Ag: NONREACTIVE
Hepatitis C Ab: NONREACTIVE
SIGNAL TO CUT-OFF: 0.06 (ref <1.00)

## 2017-08-15 LAB — HIV ANTIBODY (ROUTINE TESTING W REFLEX): HIV: NONREACTIVE

## 2017-08-15 LAB — CBC WITH DIFFERENTIAL/PLATELET
BASOS PCT: 0.6 %
Basophils Absolute: 30 cells/uL (ref 0–200)
EOS PCT: 1 %
Eosinophils Absolute: 50 cells/uL (ref 15–500)
HCT: 41.2 % (ref 38.5–50.0)
Hemoglobin: 14.1 g/dL (ref 13.2–17.1)
Lymphs Abs: 2515 cells/uL (ref 850–3900)
MCH: 29.4 pg (ref 27.0–33.0)
MCHC: 34.2 g/dL (ref 32.0–36.0)
MCV: 85.8 fL (ref 80.0–100.0)
MPV: 9.6 fL (ref 7.5–12.5)
Monocytes Relative: 6.4 %
NEUTROS PCT: 41.7 %
Neutro Abs: 2085 cells/uL (ref 1500–7800)
Platelets: 267 10*3/uL (ref 140–400)
RBC: 4.8 10*6/uL (ref 4.20–5.80)
RDW: 12.2 % (ref 11.0–15.0)
TOTAL LYMPHOCYTE: 50.3 %
WBC mixed population: 320 cells/uL (ref 200–950)
WBC: 5 10*3/uL (ref 3.8–10.8)

## 2017-08-15 LAB — LIPID PANEL
Cholesterol: 147 mg/dL (ref <200)
HDL: 38 mg/dL — ABNORMAL LOW (ref >40)
LDL Cholesterol (Calc): 92 mg/dL (calc)
NON-HDL CHOLESTEROL (CALC): 109 mg/dL (ref <130)
TRIGLYCERIDES: 84 mg/dL (ref <150)
Total CHOL/HDL Ratio: 3.9 (calc) (ref <5.0)

## 2017-08-15 LAB — PSA, TOTAL AND FREE
PSA, % FREE: 36 % (ref >25)
PSA, FREE: 0.8 ng/mL
PSA, Total: 2.2 ng/mL (ref <=4.0)

## 2017-08-15 LAB — URINE CULTURE
MICRO NUMBER: 90358303
RESULT: NO GROWTH
SPECIMEN QUALITY: ADEQUATE

## 2017-08-15 LAB — HEMOGLOBIN A1C
EAG (MMOL/L): 6.8 (calc)
HEMOGLOBIN A1C: 5.9 %{Hb} — AB (ref <5.7)
MEAN PLASMA GLUCOSE: 123 (calc)

## 2017-08-15 LAB — URINALYSIS, ROUTINE W REFLEX MICROSCOPIC
Bilirubin Urine: NEGATIVE
Glucose, UA: NEGATIVE
HGB URINE DIPSTICK: NEGATIVE
LEUKOCYTES UA: NEGATIVE
NITRITE: NEGATIVE
PH: 5.5 (ref 5.0–8.0)
Protein, ur: NEGATIVE
Specific Gravity, Urine: 1.026 (ref 1.001–1.03)

## 2017-08-15 LAB — COMPLETE METABOLIC PANEL WITH GFR
AG RATIO: 1.4 (calc) (ref 1.0–2.5)
ALKALINE PHOSPHATASE (APISO): 104 U/L (ref 40–115)
ALT: 9 U/L (ref 9–46)
AST: 16 U/L (ref 10–35)
Albumin: 4.4 g/dL (ref 3.6–5.1)
BUN: 11 mg/dL (ref 7–25)
CALCIUM: 10.3 mg/dL (ref 8.6–10.3)
CO2: 30 mmol/L (ref 20–32)
Chloride: 106 mmol/L (ref 98–110)
Creat: 0.99 mg/dL (ref 0.70–1.25)
GFR, EST NON AFRICAN AMERICAN: 80 mL/min/{1.73_m2} (ref >=60)
GFR, Est African American: 93 mL/min/{1.73_m2} (ref >=60)
GLOBULIN: 3.1 g/dL (ref 1.9–3.7)
Glucose, Bld: 84 mg/dL (ref 65–139)
POTASSIUM: 4.7 mmol/L (ref 3.5–5.3)
SODIUM: 145 mmol/L (ref 135–146)
Total Bilirubin: 0.4 mg/dL (ref 0.2–1.2)
Total Protein: 7.5 g/dL (ref 6.1–8.1)

## 2017-08-15 LAB — RPR TITER: RPR Titer: 1:1 {titer} — ABNORMAL HIGH

## 2017-08-15 LAB — TSH: TSH: 0.68 mIU/L (ref 0.40–4.50)

## 2017-08-15 LAB — RPR: RPR: REACTIVE — AB

## 2017-08-15 LAB — FLUORESCENT TREPONEMAL AB(FTA)-IGG-BLD: FLUORESCENT TREPONEMAL ABS: REACTIVE — AB

## 2017-08-16 ENCOUNTER — Encounter: Payer: Self-pay | Admitting: Family Medicine

## 2017-08-18 LAB — URINE CYTOLOGY ANCILLARY ONLY
Chlamydia: NEGATIVE
Neisseria Gonorrhea: NEGATIVE
Trichomonas: NEGATIVE

## 2017-08-20 ENCOUNTER — Telehealth: Payer: Self-pay | Admitting: Family Medicine

## 2017-08-20 NOTE — Telephone Encounter (Signed)
Called patient to let him know his CT is scheduled for 09/03/17 at 4pm-- No answer, lvm If he needs to reschedule call  [phone]

## 2017-08-22 ENCOUNTER — Encounter: Payer: Self-pay | Admitting: Family Medicine

## 2017-08-22 ENCOUNTER — Other Ambulatory Visit: Payer: Self-pay

## 2017-08-22 ENCOUNTER — Telehealth: Payer: Self-pay | Admitting: Family Medicine

## 2017-08-22 ENCOUNTER — Ambulatory Visit (INDEPENDENT_AMBULATORY_CARE_PROVIDER_SITE_OTHER): Payer: Medicare Other | Admitting: Family Medicine

## 2017-08-22 VITALS — BP 160/80 | HR 96 | Temp 98.2°F | Resp 16 | Ht 76.0 in | Wt 225.0 lb

## 2017-08-22 DIAGNOSIS — I10 Essential (primary) hypertension: Secondary | ICD-10-CM | POA: Insufficient documentation

## 2017-08-22 DIAGNOSIS — A539 Syphilis, unspecified: Secondary | ICD-10-CM | POA: Diagnosis not present

## 2017-08-22 DIAGNOSIS — R7303 Prediabetes: Secondary | ICD-10-CM | POA: Diagnosis not present

## 2017-08-22 MED ORDER — AMLODIPINE BESYLATE 5 MG PO TABS: 5.0000 mg | ORAL_TABLET | Freq: Every day | ORAL | 0 refills | Status: DC

## 2017-08-22 NOTE — Patient Instructions (Addendum)
Syphilis Syphilis is an infectious disease. It can cause serious complications if left untreated. What are the causes? Syphilis is caused by a type of bacteria called Treponema pallidum. It is most commonly spread through sexual contact. Syphilis may also spread to a fetus through the blood of the mother. What are the signs or symptoms? Symptoms vary depending on the stage of the disease. Some symptoms may disappear without treatment. However, this does not mean that the infection is gone. One form of syphilis (called latent syphilis) has no symptoms. Prima DASH Eating Plan DASH stands for "Dietary Approaches to Stop Hypertension." The DASH eating plan is a healthy eating plan that has been shown to reduce high blood pressure (hypertension). It may also reduce your risk for type 2 diabetes, heart disease, and stroke. The DASH eating plan may also help with weight loss. What are tips for following this plan? General guidelines  Avoid eating more than 2,300 mg (milligrams) of salt (sodium) a day. If you have hypertension, you may need to reduce your sodium intake to 1,500 mg a day.  Limit alcohol intake to no more than 1 drink a day for nonpregnant women and 2 drinks a day for men. One drink equals 12 oz of beer, 5 oz of wine, or 1 oz of hard liquor.  Work with your health care provider to maintain a healthy body weight or to lose weight. Ask what an ideal weight is for you.  Get at least 30 minutes of exercise that causes your heart to beat faster (aerobic exercise) most days of the week. Activities may include walking, swimming, or biking.  Work with your health care provider or diet and nutrition specialist (dietitian) to adjust your eating plan to your individual calorie needs. Reading food labels  Check food labels for the amount of sodium per serving. Choose foods with less than 5 percent of the Daily Value of sodium. Generally, foods with less than 300 mg of sodium per serving fit into  this eating plan.  To find whole grains, look for the word "whole" as the first word in the ingredient list. Shopping  Buy products labeled as "low-sodium" or "no salt added."  Buy fresh foods. Avoid canned foods and premade or frozen meals. Cooking  Avoid adding salt when cooking. Use salt-free seasonings or herbs instead of table salt or sea salt. Check with your health care provider or pharmacist before using salt substitutes.  Do not fry foods. Cook foods using healthy methods such as baking, boiling, grilling, and broiling instead.  Cook with heart-healthy oils, such as olive, canola, soybean, or sunflower oil. Meal planning   Eat a balanced diet that includes: ? 5 or more servings of fruits and vegetables each day. At each meal, try to fill half of your plate with fruits and vegetables. ? Up to 6-8 servings of whole grains each day. ? Less than 6 oz of lean meat, poultry, or fish each day. A 3-oz serving of meat is about the same size as a deck of cards. One egg equals 1 oz. ? 2 servings of low-fat dairy each day. ? A serving of nuts, seeds, or beans 5 times each week. ? Heart-healthy fats. Healthy fats called Omega-3 fatty acids are found in foods such as flaxseeds and coldwater fish, like sardines, salmon, and mackerel.  Limit how much you eat of the following: ? Canned or prepackaged foods. ? Food that is high in trans fat, such as fried foods. ? Food that is  high in saturated fat, such as fatty meat. ? Sweets, desserts, sugary drinks, and other foods with added sugar. ? Full-fat dairy products.  Do not salt foods before eating.  Try to eat at least 2 vegetarian meals each week.  Eat more home-cooked food and less restaurant, buffet, and fast food.  When eating at a restaurant, ask that your food be prepared with less salt or no salt, if possible. What foods are recommended? The items listed may not be a complete list. Talk with your dietitian about what dietary  choices are best for you. Grains Whole-grain or whole-wheat bread. Whole-grain or whole-wheat pasta. Brown rice. Modena Morrow. Bulgur. Whole-grain and low-sodium cereals. Pita bread. Low-fat, low-sodium crackers. Whole-wheat flour tortillas. Vegetables Fresh or frozen vegetables (raw, steamed, roasted, or grilled). Low-sodium or reduced-sodium tomato and vegetable juice. Low-sodium or reduced-sodium tomato sauce and tomato paste. Low-sodium or reduced-sodium canned vegetables. Fruits All fresh, dried, or frozen fruit. Canned fruit in natural juice (without added sugar). Meat and other protein foods Skinless chicken or [place]. Ground chicken or [place]. Pork with fat trimmed off. Fish and seafood. Egg whites. Dried beans, peas, or lentils. Unsalted nuts, nut butters, and seeds. Unsalted canned beans. Lean cuts of beef with fat trimmed off. Low-sodium, lean deli meat. Dairy Low-fat (1%) or fat-free (skim) milk. Fat-free, low-fat, or reduced-fat cheeses. Nonfat, low-sodium ricotta or cottage cheese. Low-fat or nonfat yogurt. Low-fat, low-sodium cheese. Fats and oils Soft margarine without trans fats. Vegetable oil. Low-fat, reduced-fat, or light mayonnaise and salad dressings (reduced-sodium). Canola, safflower, olive, soybean, and sunflower oils. Avocado. Seasoning and other foods Herbs. Spices. Seasoning mixes without salt. Unsalted popcorn and pretzels. Fat-free sweets. What foods are not recommended? The items listed may not be a complete list. Talk with your dietitian about what dietary choices are best for you. Grains Baked goods made with fat, such as croissants, muffins, or some breads. Dry pasta or rice meal packs. Vegetables Creamed or fried vegetables. Vegetables in a cheese sauce. Regular canned vegetables (not low-sodium or reduced-sodium). Regular canned tomato sauce and paste (not low-sodium or reduced-sodium). Regular tomato and vegetable juice (not low-sodium or reduced-sodium).  Angie Fava. Olives. Fruits Canned fruit in a light or heavy syrup. Fried fruit. Fruit in cream or butter sauce. Meat and other protein foods Fatty cuts of meat. Ribs. Fried meat. Berniece Salines. Sausage. Bologna and other processed lunch meats. Salami. Fatback. Hotdogs. Bratwurst. Salted nuts and seeds. Canned beans with added salt. Canned or smoked fish. Whole eggs or egg yolks. Chicken or [place] with skin. Dairy Whole or 2% milk, cream, and half-and-half. Whole or full-fat cream cheese. Whole-fat or sweetened yogurt. Full-fat cheese. Nondairy creamers. Whipped toppings. Processed cheese and cheese spreads. Fats and oils Butter. Stick margarine. Lard. Shortening. Ghee. Bacon fat. Tropical oils, such as coconut, palm kernel, or palm oil. Seasoning and other foods Salted popcorn and pretzels. Onion salt, garlic salt, seasoned salt, table salt, and sea salt. Worcestershire sauce. Tartar sauce. Barbecue sauce. Teriyaki sauce. Soy sauce, including reduced-sodium. Steak sauce. Canned and packaged gravies. Fish sauce. Oyster sauce. Cocktail sauce. Horseradish that you find on the shelf. Ketchup. Mustard. Meat flavorings and tenderizers. Bouillon cubes. Hot sauce and Tabasco sauce. Premade or packaged marinades. Premade or packaged taco seasonings. Relishes. Regular salad dressings. Where to find more information:  National Heart, Lung, and [place]: https://wilson-eaton.com/  American Heart Association: www.heart.org Summary  The DASH eating plan is a healthy eating plan that has been shown to reduce high blood pressure (hypertension). It  may also reduce your risk for type 2 diabetes, heart disease, and stroke.  With the DASH eating plan, you should limit salt (sodium) intake to 2,300 mg a day. If you have hypertension, you may need to reduce your sodium intake to 1,500 mg a day.  When on the DASH eating plan, aim to eat more fresh fruits and vegetables, whole grains, lean proteins, low-fat dairy, and  heart-healthy fats.  Work with your health care provider or diet and nutrition specialist (dietitian) to adjust your eating plan to your individual calorie needs. This information is not intended to replace advice given to you by your health care provider. Make sure you discuss any questions you have with your health care provider. Document Released: 05/02/2011 Document Revised: 05/06/2016 Document Reviewed: 05/06/2016 Elsevier Interactive Patient Education  2018 [employer]. ry Syphilis  Painless sores (chancres) in and around the genital organs and mouth.  Swollen lymph nodes near the sores. Secondary Syphilis  A rash or sores over any portion of the body, including the palms of the hands and soles of the feet.  Fever.  Headache.  Sore throat.  Swollen lymph nodes.  New sores in the mouth or on the genitals.  Feeling generally ill.  Having pain in the joints. Tertiary Syphilis The third stage of syphilis involves severe damage to different organs in the body, such as the brain, spinal cord, and heart. Signs and symptoms may include:  Dementia.  Personality and mood changes.  Difficulty walking.  Heart failure.  Fainting.  Enlargement (aneurysm) of the aorta.  Tumors of the skin, bones, or liver.  Muscle weakness.  Sudden "lightning" pains, numbness, or tingling.  Problems with coordination.  Vision changes.  How is this diagnosed?  A physical exam will be done.  Blood tests will be done to confirm the diagnosis.  If the disease is in the first or second stages, a fluid (drainage) sample from a sore or rash may be examined under a microscope to detect the disease-causing bacteria.  Fluid around the spine may need to be examined to detect brain damage or inflammation of the brain lining (meningitis).  If the disease is in the third stage, X-rays, CT scans, MRIs, echocardiograms, ultrasounds, or cardiac catheterization may also be done to detect disease  of the heart, aorta, or brain. How is this treated? Syphilis can be cured with antibiotic medicine if a diagnosis is made early. During the first day of treatment, you may experience fever, chills, headache, nausea, or aching all over your body. This is a normal reaction to the antibiotics. Follow these instructions at home:  Take your antibiotic medicine as directed by your health care provider. Finish the antibiotic even if you start to feel better. Incomplete treatment will put you at risk for continued infection and could be life threatening.  Take medicines only as directed by your health care provider.  Do not have sexual intercourse until your treatment is completed or as directed by your health care provider.  Inform your recent sexual partners that you were diagnosed with syphilis. They need to seek care and treatment, even if they have no symptoms. It is necessary that all your sexual partners be tested for infection and treated if they have the disease.  Keep all follow-up visits as directed by your health care provider. It is important to keep all your appointments.  If your test results are not ready during your visit, make an appointment with your health care provider to find out  the results. Do not assume everything is normal if you have not heard from your health care provider or the medical facility. It is your responsibility to get your test results. Contact a health care provider if:  You continue to have any of the following 24 hours after beginning treatment: ? Fever. ? Chills. ? Headache. ? Nausea. ? Aching all over your body.  You have symptoms of an allergic reaction to medicine, such as: ? Chills. ? A headache. ? Light-headedness. ? A new rash (especially hives). ? Difficulty breathing. This information is not intended to replace advice given to you by your health care provider. Make sure you discuss any questions you have with your health care  provider. Document Released: 03/03/2013 Document Revised: 10/19/2015 Document Reviewed: 12/01/2012 Elsevier Interactive Patient Education  2017 [employer].

## 2017-08-22 NOTE — Progress Notes (Signed)
Patient ID: Brandon [name], male    DOB: Oct 12, 1950, 67 y.o.   MRN: [id number]  Chief Complaint  Patient presents with  . Follow-up    Allergies Patient has no known allergies.  Subjective:   Brandon [name] is a 67 y.o. male who presents to [facility] today.  HPI Patient presents today to do over his test results.  He had lab testing done at his last visit approximately a days ago which revealed a reactive RPR.  He subsequently had a titer performed and specific treponemal antibody testing performed which was positive.  He comes in to discuss his positive syphilis test.  He reports that he has no prior history of diagnosis of syphilis.  He does have a brother that has neurosyphilis and has cognitive impairment as a result of this.  He denies any history of any genital rashes or ulcerations.  He reports that he has not been sexually active with anyone other than his wife since he got married over 10-15 years ago.  He is here to discuss the status.  He has not gotten a call back regarding his CT scan for lung cancer screening due to his smoking history.  He also has not heard back regarding the referral that was placed due to his weight loss and early satiety.  He reports that he does feel a little bit tired.  He reports he has been forcing himself to get out and do things.  He reports he does not feel depressed.  His energy is a little low.  He reports that his appetite is still down and he gets full very easily.  He denies any melena, bright red blood per rectum, or abdominal pain.    He reports he has been checking his blood pressure at home and it has been running high.  It was elevated at the last visit but he thought it was due to the fact that he was a new patient.  Is here to discuss his other labs.  His labs indicate that he is prediabetic.   Past Medical History:  Diagnosis Date  . No pertinent past medical history     Past Surgical History:  Procedure  Laterality Date  . BACK SURGERY     4 yrs ago  . COLONOSCOPY  01/22/2012   Procedure: COLONOSCOPY;  Surgeon: Malissa Hippo, MD;  Location: [facility];  Service: Endoscopy;  Laterality: N/A;  200    Family History  Problem Relation Age of Onset  . Diabetes Mother   . Hypertension Mother   . Breast cancer Mother   . Alcohol abuse Father   . Breast cancer Daughter   . Colon cancer Neg Hx      Social History   Socioeconomic History  . Marital status: Married    Spouse name: Not on file  . Number of children: Not on file  . Years of education: Not on file  . Highest education level: Not on file  Occupational History  . Not on file  Social Needs  . Financial resource strain: Not on file  . Food insecurity:    Worry: Not on file    Inability: Not on file  . Transportation needs:    Medical: Not on file    Non-medical: Not on file  Tobacco Use  . Smoking status: Current Every Day Smoker    Packs/day: 1.00    Years: 45.00    Pack years: 45.00  Types: Cigarettes  . Smokeless tobacco: Never Used  . Tobacco comment: 1 pack a day  Substance and Sexual Activity  . Alcohol use: No  . Drug use: No  . Sexual activity: Yes    Birth control/protection: None  Lifestyle  . Physical activity:    Days per week: Not on file    Minutes per session: Not on file  . Stress: Not on file  Relationships  . Social connections:    Talks on phone: Not on file    Gets together: Not on file    Attends religious service: Not on file    Active member of club or organization: Not on file    Attends meetings of clubs or organizations: Not on file    Relationship status: Not on file  Other Topics Concern  . Not on file  Social History Narrative   [place] up in [place], [place] 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    Married for 17 years. Has 3 children, 11 grandchildren, 4 great grandchildren.   Wife had breast cancer and she died eight years ago. Remarried, wife is younger.     Wears seatbelt.   Eats all food groups.     Review of Systems  Constitutional: Positive for appetite change, fatigue and unexpected weight change. Negative for chills and fever.  HENT: Negative for trouble swallowing and voice change.   Eyes: Negative for visual disturbance.  Respiratory: Negative for cough, chest tightness, shortness of breath and wheezing.   Cardiovascular: Negative for chest pain, palpitations and leg swelling.  Gastrointestinal: Negative for abdominal distention, abdominal pain, diarrhea, nausea and vomiting.  Genitourinary: Negative for decreased urine volume, dysuria and frequency.  Musculoskeletal: Negative for myalgias.  Skin: Negative for rash.  Neurological: Negative for dizziness, tremors, syncope, facial asymmetry, weakness and headaches.  Hematological: Negative for adenopathy. Does not bruise/bleed easily.  Psychiatric/Behavioral: Negative for behavioral problems, dysphoric mood, sleep disturbance and suicidal ideas.     Objective:   BP (!) 160/80 (BP Location: Left Arm, Patient Position: Sitting, Cuff Size: Normal)   Pulse 96   Temp 98.2 F (36.8 C) (Temporal)   Resp 16   Ht 6\' 4"  (1.93 m)   Wt 225 lb (102.1 kg)   SpO2 97%   BMI 27.39 kg/m   Physical Exam  Constitutional: He appears well-developed and well-nourished.  HENT:  Head: Normocephalic and atraumatic.  Eyes: Pupils are equal, round, and reactive to light. EOM are normal.  Neck: Normal range of motion. Neck supple. No thyromegaly present.  Cardiovascular: Normal rate, regular rhythm and normal heart sounds.  Pulmonary/Chest: Effort normal and breath sounds normal.  Musculoskeletal: He exhibits no edema.  Neurological: No cranial nerve deficit.  Skin: Skin is warm, dry and intact.  Psychiatric: He has a normal mood and affect. His behavior is normal. Thought content normal.  Vitals reviewed.    Assessment and Plan  1. HTN, goal below 140/90 Lifestyle modifications discussed  with patient including a diet emphasizing vegetables, fruits, and whole grains. Limiting intake of sodium to less than 2,400 mg per day.  Recommendations discussed include consuming low-fat dairy products, poultry, fish, legumes, non-tropical vegetable oils, and nuts; and limiting intake of sweets, sugar-sweetened beverages, and red meat. Discussed following a plan such as the Dietary Approaches to Stop Hypertension (DASH) diet. Patient to read up on this diet.   Start amlodipine 5 mg p.o. daily.  He will check his blood pressure at home.  If it  is continued to run high after 2 weeks he will call before his follow-up visit.  We will increase his dose at that time.  He was asked to make improvements in his diet.  He reports only uses lots of salt and has a diet high in fried and fatty foods. Patient counseled in detail regarding the risks of medication. Told to call or return to clinic if develop any worrisome signs or symptoms. Patient voiced understanding.  We did discuss amlodipine and its mechanism of action.  He is concerned about possible sexual side effects with taking a blood pressure medication.  We discussed that if he does have problems with this medication the weekend discontinue and change medications at that time. - amLODipine (NORVASC) 5 MG tablet; Take 1 tablet (5 mg total) by mouth daily.  Dispense: 30 tablet; Refill: 0  2. Prediabetes Risk of diabetes discussed with patient.  We discussed dietary and lifestyle modifications to decrease his risk of becoming diabetic.  He reports that heeats lots of candy.  Plan to recheck A1c in 3 months.  Diet, exercise discussed today.  3. Syphilis Discussed with patient that I had spoken with the health department.  I was told that we would do submit his information and he would be contacted by disease intervention specialist who would discuss his past history.  In addition they would review his sexual contacts with them if needed.  In addition he  would be able to go to the health department at that time and get penicillin injections to treat his condition.  We did review his other STD testing today which was negative.  All his labs were reviewed.  Office visit was approximately 30 minutes.  Greater than 50% was spent counseling and coordinating care.  Patient was told that if he does not hear back regarding the disease intervention specialist and he has recommended follow-up to please call our office.  He was also told today of his appointment for his CT scan of the chest.  He has referral with GI was reviewed today and he should be receiving a call.  Return in about 1 month (around 09/19/2017). Aliene Beams, MD 08/22/2017

## 2017-08-22 NOTE — Telephone Encounter (Signed)
Called and spoke with Brandon [name] regarding patient case.  She recommended calling the :   [facility] [place]. Needs to be reported and then the DIS will call patient. Review of information and history will be done by DIS and determine the treatment recommendations. [facility] [phone]. They will contact Brandon [name]/she is the DIS local worker in this area. [phone].   Please call the STD clinic and give needed information and labs on this patient.

## 2017-08-22 NOTE — Telephone Encounter (Signed)
Spoke to [employer] Loye, she states that with patient's age of 67, the state will not follow-up with him as he could have been exposed or infected earlier. He is not high risk as there is no symptoms and the titre was low. She states we still need to complete the paperwork and submit that as normal. Any decision to treat is at the PCP discretion

## 2017-08-27 NOTE — Telephone Encounter (Signed)
[facility]. Please fax over his labs to her so that they can contact regarding treatment. Fax number [phone], Attention Crystal/STD clinic.

## 2017-08-27 NOTE — Telephone Encounter (Signed)
Done

## 2017-08-28 NOTE — Telephone Encounter (Signed)
Crystal called in with an update. She states that an e-mail has been sent in to the [place] [facility] ([facility] to get their recommendation of how to proceed. She states they are holding off treatment until they hear back from them.   

## 2017-08-28 NOTE — Telephone Encounter (Signed)
Called patient regarding message below. No answer, left generic message for patient to return call.   

## 2017-08-28 NOTE — Telephone Encounter (Signed)
Please call and advise patient of this information.

## 2017-09-01 NOTE — Telephone Encounter (Signed)
Called patient regarding message below. No answer, unable to leave message.  

## 2017-09-01 NOTE — Telephone Encounter (Signed)
Called patient regarding message below. No answer, left generic message for patient to return call.   

## 2017-09-02 ENCOUNTER — Telehealth: Payer: Self-pay | Admitting: Family Medicine

## 2017-09-02 DIAGNOSIS — A523 Neurosyphilis, unspecified: Secondary | ICD-10-CM

## 2017-09-02 NOTE — Telephone Encounter (Signed)
Golden Poprystal Butler at [facility] is requesting a phone call from you regarding [phone]

## 2017-09-03 ENCOUNTER — Ambulatory Visit (HOSPITAL_COMMUNITY): Payer: Medicare Other

## 2017-09-03 NOTE — Telephone Encounter (Signed)
Brandon CanavanKrystal called back and I spoke with her regarding patient. She reports that the Review of his case recommended he get three PCN injections. He also needs neurology consult/review and opthal exam for neurosyphilis.

## 2017-09-03 NOTE — Telephone Encounter (Signed)
Please call and advise patient that I have spoken with [facility] and that in addition to the 3 pcn shots, they are recommending evaluation by neurology and ophthalmology for neurosyphilis. I will place the referrals. Janine Limboachel H. Tracie HarrierHagler, MD

## 2017-09-03 NOTE — Telephone Encounter (Signed)
Called patient regarding message below. No answer, left generic message for patient to return call.   

## 2017-09-03 NOTE — Telephone Encounter (Signed)
Called Brandon [name] back and left message for her to call back. Called her back at: [phone].

## 2017-09-05 ENCOUNTER — Telehealth: Payer: Self-pay | Admitting: Family Medicine

## 2017-09-05 ENCOUNTER — Ambulatory Visit (INDEPENDENT_AMBULATORY_CARE_PROVIDER_SITE_OTHER): Payer: Medicare Other | Admitting: Family Medicine

## 2017-09-05 ENCOUNTER — Encounter: Payer: Self-pay | Admitting: Family Medicine

## 2017-09-05 VITALS — BP 158/80 | HR 57 | Resp 16 | Ht 76.0 in | Wt 227.0 lb

## 2017-09-05 DIAGNOSIS — R7303 Prediabetes: Secondary | ICD-10-CM | POA: Diagnosis not present

## 2017-09-05 DIAGNOSIS — Z720 Tobacco use: Secondary | ICD-10-CM | POA: Diagnosis not present

## 2017-09-05 DIAGNOSIS — A539 Syphilis, unspecified: Secondary | ICD-10-CM | POA: Diagnosis not present

## 2017-09-05 DIAGNOSIS — I10 Essential (primary) hypertension: Secondary | ICD-10-CM | POA: Diagnosis not present

## 2017-09-05 DIAGNOSIS — Z23 Encounter for immunization: Secondary | ICD-10-CM | POA: Diagnosis not present

## 2017-09-05 MED ORDER — NICOTINE 21 MG/24HR TD PT24: 21.0000 mg | MEDICATED_PATCH | Freq: Every day | TRANSDERMAL | 0 refills | Status: DC

## 2017-09-05 MED ORDER — AMLODIPINE BESYLATE 10 MG PO TABS: 10.0000 mg | ORAL_TABLET | Freq: Every day | ORAL | 3 refills | Status: DC

## 2017-09-05 NOTE — Telephone Encounter (Signed)
This message is no longer relevant as the following message relays updated information. I am closing this and will continue to monitor the other message for completion.

## 2017-09-05 NOTE — Telephone Encounter (Signed)
Patient was in to see Dr. Tracie HarrierHagler today.

## 2017-09-05 NOTE — Patient Instructions (Addendum)
Check status of referral to Dr. Karilyn Cotaehman  Follow up in 1 month Bring BP cuff to the next visit.   Cut down on salt and candy!!!!  Keep appointment with neurology Keep appointment with eye doctor  Quit Smoking cigarettes.  Nicotine skin patches What is this medicine? NICOTINE (NIK oh teen) helps people stop smoking. The patches replace the nicotine found in cigarettes and help to decrease withdrawal effects. They are most effective when used in combination with a stop-smoking program. This medicine may be used for other purposes; ask your health care provider or pharmacist if you have questions. COMMON BRAND NAME(S): Habitrol, Nicoderm CQ, Nicotrol What should I tell my health care provider before I take this medicine? They need to know if you have any of these conditions: -diabetes -heart disease, angina, irregular heartbeat or previous heart attack -high blood pressure -lung disease, including asthma -overactive thyroid -pheochromocytoma -seizures or a history of seizures -skin problems, like eczema -stomach problems or ulcers -an unusual or allergic reaction to nicotine, adhesives, other medicines, foods, dyes, or preservatives -pregnant or trying to get pregnant -breast-feeding How should I use this medicine? This medicine is for use on the skin. Follow the directions that come with the patches. Find an area of skin on your upper arm, chest, or back that is clean, dry, greaseless, undamaged and hairless. Wash hands with plain soap and water. Do not use anything that contains aloe, lanolin or glycerin as these may prevent the patch from sticking. Dry thoroughly. Remove the patch from the sealed pouch. Do not try to cut or trim the patch. Using your palm, press the patch firmly in place for 10 seconds to make sure that there is good contact with your skin. After applying the patch, wash your hands. Change the patch every day, keeping to a regular schedule. When you apply a new patch,  use a new area of skin. Wait at least 1 week before using the same area again. Talk to your pediatrician regarding the use of this medicine in children. Special care may be needed. Overdosage: If you think you have taken too much of this medicine contact a poison control center or emergency room at once. NOTE: This medicine is only for you. Do not share this medicine with others. What if I miss a dose? If you forget to replace a patch, use it as soon as you can. Only use one patch at a time and do not leave on the skin for longer than directed. If a patch falls off, you can replace it, but keep to your schedule and remove the patch at the right time. What may interact with this medicine? -medicines for asthma -medicines for blood pressure -medicines for mental depression This list may not describe all possible interactions. Give your health care provider a list of all the medicines, herbs, non-prescription drugs, or dietary supplements you use. Also tell them if you smoke, drink alcohol, or use illegal drugs. Some items may interact with your medicine. What should I watch for while using this medicine? You should begin using the nicotine patch the day you stop smoking. It is okay if you do not succeed at your attempt to quit and have a cigarette. You can still continue your quit attempt and keep using the product as directed. Just throw away your cigarettes and get back to your quit plan. You can keep the patch in place during swimming, bathing, and showering. If your patch falls off during these activities, replace it.  When you first apply the patch, your skin may itch or burn. This should go away soon. When you remove a patch, the skin may look red, but this should only last for a few days. Call your doctor or health care professional if skin redness does not go away after 4 days, if your skin swells, or if you get a rash. If you are a diabetic and you quit smoking, the effects of insulin may be  increased and you may need to reduce your insulin dose. Check with your doctor or health care professional about how you should adjust your insulin dose. If you are going to have a magnetic resonance imaging (MRI) procedure, tell your MRI technician if you have this patch on your body. It must be removed before a MRI. What side effects may I notice from receiving this medicine? Side effects that you should report to your doctor or health care professional as soon as possible: -allergic reactions like skin rash, itching or hives, swelling of the face, lips, or tongue -breathing problems -changes in hearing -changes in vision -chest pain -cold sweats -confusion -fast, irregular heartbeat -feeling faint or lightheaded, falls -headache -increased saliva -skin redness that lasts more than 4 days -stomach pain -signs and symptoms of nicotine overdose like nausea; vomiting; dizziness; weakness; and rapid heartbeat Side effects that usually do not require medical attention (report to your doctor or health care professional if they continue or are bothersome): -diarrhea -dry mouth -hiccups -irritability -nervousness or restlessness -trouble sleeping or vivid dreams This list may not describe all possible side effects. Call your doctor for medical advice about side effects. You may report side effects to FDA at 1-800-FDA-1088. Where should I keep my medicine? Keep out of the reach of children. Store at room temperature between 20 and 25 degrees C (68 and 77 degrees F). Protect from heat and light. Store in Tax inspector until ready to use. Throw away unused medicine after the expiration date. When you remove a patch, fold with sticky sides together; put in an empty opened pouch and throw away. NOTE: This sheet is a summary. It may not cover all possible information. If you have questions about this medicine, talk to your doctor, pharmacist, or health care provider.  2018 Elsevier/Gold  Standard (2014-04-11 15:46:21)  Steps to Quit Smoking Smoking tobacco can be bad for your health. It can also affect almost every organ in your body. Smoking puts you and people around you at risk for many serious long-lasting (chronic) diseases. Quitting smoking is hard, but it is one of the best things that you can do for your health. It is never too late to quit. What are the benefits of quitting smoking? When you quit smoking, you lower your risk for getting serious diseases and conditions. They can include:  Lung cancer or lung disease.  Heart disease.  Stroke.  Heart attack.  Not being able to have children (infertility).  Weak bones (osteoporosis) and broken bones (fractures).  If you have coughing, wheezing, and shortness of breath, those symptoms may get better when you quit. You may also get sick less often. If you are pregnant, quitting smoking can help to lower your chances of having a baby of low birth weight. What can I do to help me quit smoking? Talk with your doctor about what can help you quit smoking. Some things you can do (strategies) include:  Quitting smoking totally, instead of slowly cutting back how much you smoke over a period of  time.  Going to in-person counseling. You are more likely to quit if you go to many counseling sessions.  Using resources and support systems, such as: ? Agricultural engineer with a Veterinary surgeon. ? Phone quitlines. ? Automotive engineer. ? Support groups or group counseling. ? Text messaging programs. ? Mobile phone apps or applications.  Taking medicines. Some of these medicines may have nicotine in them. If you are pregnant or breastfeeding, do not take any medicines to quit smoking unless your doctor says it is okay. Talk with your doctor about counseling or other things that can help you.  Talk with your doctor about using more than one strategy at the same time, such as taking medicines while you are also going to in-person  counseling. This can help make quitting easier. What things can I do to make it easier to quit? Quitting smoking might feel very hard at first, but there is a lot that you can do to make it easier. Take these steps:  Talk to your family and friends. Ask them to support and encourage you.  Call phone quitlines, reach out to support groups, or work with a Veterinary surgeon.  Ask people who smoke to not smoke around you.  Avoid places that make you want (trigger) to smoke, such as: ? Bars. ? Parties. ? Smoke-break areas at work.  Spend time with people who do not smoke.  Lower the stress in your life. Stress can make you want to smoke. Try these things to help your stress: ? Getting regular exercise. ? Deep-breathing exercises. ? Yoga. ? Meditating. ? Doing a body scan. To do this, close your eyes, focus on one area of your body at a time from head to toe, and notice which parts of your body are tense. Try to relax the muscles in those areas.  Download or buy apps on your mobile phone or tablet that can help you stick to your quit plan. There are many free apps, such as QuitGuide from the [employer] developer for Disease Control and Prevention). You can find more support from smokefree.gov and other websites.  This information is not intended to replace advice given to you by your health care provider. Make sure you discuss any questions you have with your health care provider. Document Released: 03/09/2009 Document Revised: 01/09/2016 Document Reviewed: 09/27/2014 Elsevier Interactive Patient Education  2018 [employer].

## 2017-09-05 NOTE — Progress Notes (Signed)
Patient ID: Brandon [name], male    DOB: 04-07-1951, 67 y.o.   MRN: [id number]  Chief Complaint  Patient presents with  . Hypertension    follow up visit     Allergies Patient has no known allergies.  Subjective:   Brandon [name] is a 67 y.o. male who presents to [facility] today.  HPI Here for follow up today.   Smoking for over 50 years, 1 ppd. Has been times since starting smoking that smoked more and when smoked less. Is interested in talking about options for quitting. Has CT for lung cancer screening this afternoon. Reports that being told should get screening for cancer of lung really hit home and he does want to quit. Has tried before. Not really tried hard to quit. Has been cutting down over the past month.   Has been to the HD and got the first of three series of PCN shots. Reports that he just wants to make sure the syphilis is gone.   Here to get BP checked. Has been taking the norvasc but BP is still running high. Reports that checked it at home but is not sure if his cuff is correct. No CP, SOB, edema. Feels a bit more energy. Has been trying to be more active since told he was pre-diabetic. Does not want to be diabetic. Reports that has been working in the yard. No CP, SOB, or DOE.    Hypertension  This is a new problem. The current episode started 1 to 4 weeks ago. The problem has been gradually improving since onset. The problem is uncontrolled. Associated symptoms include malaise/fatigue. Pertinent negatives include no anxiety, blurred vision, chest pain, headaches, neck pain, orthopnea, palpitations, peripheral edema, PND, shortness of breath or sweats. There are no associated agents to hypertension. Risk factors for coronary artery disease include family history, stress, smoking/tobacco exposure and male gender. Past treatments include nothing. Compliance problems include diet and exercise.  There is no history of angina, kidney disease,  CAD/MI, CVA, heart failure, PVD or retinopathy.    Past Medical History:  Diagnosis Date  . No pertinent past medical history     Past Surgical History:  Procedure Laterality Date  . BACK SURGERY     4 yrs ago  . COLONOSCOPY  01/22/2012   Procedure: COLONOSCOPY;  Surgeon: Malissa Hippo, MD;  Location: [facility];  Service: Endoscopy;  Laterality: N/A;  200    Family History  Problem Relation Age of Onset  . Diabetes Mother   . Hypertension Mother   . Breast cancer Mother   . Alcohol abuse Father   . Breast cancer Daughter   . Colon cancer Neg Hx      Social History   Socioeconomic History  . Marital status: Married    Spouse name: Not on file  . Number of children: Not on file  . Years of education: Not on file  . Highest education level: Not on file  Occupational History  . Not on file  Social Needs  . Financial resource strain: Not on file  . Food insecurity:    Worry: Not on file    Inability: Not on file  . Transportation needs:    Medical: Not on file    Non-medical: Not on file  Tobacco Use  . Smoking status: Current Every Day Smoker    Packs/day: 1.00    Years: 45.00    Pack years: 45.00    Types: Cigarettes  .  Smokeless tobacco: Never Used  . Tobacco comment: 1 pack a day  Substance and Sexual Activity  . Alcohol use: No  . Drug use: No  . Sexual activity: Yes    Birth control/protection: None  Lifestyle  . Physical activity:    Days per week: Not on file    Minutes per session: Not on file  . Stress: Not on file  Relationships  . Social connections:    Talks on phone: Not on file    Gets together: Not on file    Attends religious service: Not on file    Active member of club or organization: Not on file    Attends meetings of clubs or organizations: Not on file    Relationship status: Not on file  Other Topics Concern  . Not on file  Social History Narrative   Grew up in [place], [place]. 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      Married for 17 years. Has 3 children, 11 grandchildren, 4 great grandchildren.   Wife had breast cancer and she died eight years ago. Remarried, wife is younger.    Wears seatbelt.   Eats all food groups.    No current outpatient medications on file prior to visit.   No current facility-administered medications on file prior to visit.     Review of Systems  Constitutional: Positive for activity change, appetite change, fatigue and malaise/fatigue. Negative for chills, fever and unexpected weight change.  HENT: Negative for dental problem, sore throat, trouble swallowing and voice change.   Eyes: Negative for blurred vision, photophobia and visual disturbance.  Respiratory: Negative for cough, chest tightness, shortness of breath and wheezing.   Cardiovascular: Negative for chest pain, palpitations, orthopnea, leg swelling and PND.  Gastrointestinal: Negative for abdominal pain, diarrhea, nausea and vomiting.  Genitourinary: Negative for decreased urine volume, dysuria and frequency.  Musculoskeletal: Negative for myalgias, neck pain and neck stiffness.  Skin: Negative for rash.  Neurological: Negative for dizziness, tremors, syncope, facial asymmetry, weakness, light-headedness, numbness and headaches.  Hematological: Negative for adenopathy. Does not bruise/bleed easily.  Psychiatric/Behavioral: Negative for agitation, decreased concentration, dysphoric mood and suicidal ideas. The patient is not nervous/anxious.      Objective:   BP (!) 158/80   Pulse (!) 57   Resp 16   Ht 6\' 4"  (1.93 m)   Wt 227 lb (103 kg)   SpO2 98%   BMI 27.63 kg/m   Physical Exam  Constitutional: He is oriented to person, place, and time. He appears well-developed and well-nourished.  HENT:  Head: Normocephalic and atraumatic.  Eyes: Pupils are equal, round, and reactive to light. EOM are normal.  Neck: Normal range of motion. Neck supple. No thyromegaly present.  Cardiovascular: Normal rate,  regular rhythm and normal heart sounds.  Pulmonary/Chest: Effort normal and breath sounds normal.  Musculoskeletal: He exhibits no edema.  Neurological: He is alert and oriented to person, place, and time.  Skin: Skin is warm, dry and intact. Capillary refill takes less than 2 seconds.  Psychiatric: He has a normal mood and affect. His behavior is normal. Judgment and thought content normal.  Vitals reviewed.  Depression screen [facility] 2/9 09/05/2017 08/14/2017 08/14/2017  Decreased Interest 0 3 3  Down, Depressed, Hopeless 0 0 3  PHQ - 2 Score 0 3 6  Altered sleeping 0 0 0  Tired, decreased energy 3 3 3   Change in appetite 3 3 3   Feeling bad or failure about  yourself  0 0 0  Trouble concentrating 0 0 0  Moving slowly or fidgety/restless 0 0 0  Suicidal thoughts 0 0 0  PHQ-9 Score 6 9 12   Difficult doing work/chores Somewhat difficult - Somewhat difficult     Assessment and Plan  1. Essential hypertension Increase the norvasc to 10 mg po qd.  Lifestyle modifications discussed with patient including a diet emphasizing vegetables, fruits, and whole grains. Limiting intake of sodium to less than 2,400 mg per day.  Recommendations discussed include consuming low-fat dairy products, poultry, fish, legumes, non-tropical vegetable oils, and nuts; and limiting intake of sweets, sugar-sweetened beverages, and red meat. Discussed following a plan such as the Dietary Approaches to Stop Hypertension (DASH) diet. Patient to read up on this diet.  Patient counseled in detail regarding the risks of medication. Told to call or return to clinic if develop any worrisome signs or symptoms. Patient voiced understanding.  Bring BP cuff to next visit. Consider/calculate ASA need after BP controlled.  - amLODipine (NORVASC) 10 MG tablet; Take 1 tablet (10 mg total) by mouth daily.  Dispense: 90 tablet; Refill: 3  2. Immunization due Vaccine given.  - Tdap vaccine greater than or equal to 7yo IM  3.  Prediabetes Check A1c in 3 months.   4. Syphilis Discussed with patient the [facility] as [facility] as per state recommendations from review board.  Refer to Eye doctor-needs detailed eye exam.  Refer to neurology-needs detailed neuro exam to rule out neurosyphilis.  [facility] next 2 in the series of 3 PCN injections.  Safe sex precautions.   5. Tobacco abuse The 5 A's Model for treating Tobacco Use and Dependence was used today. I have identified and documented tobacco use status for this patient. I have urged the patient to quit tobacco use. At this time, the patient is willing and ready to attempt to quit. We have discussed medication options to aid in smoking cessation including nicotine replacement therapy (NRT), zyban, and chantix. We have also discussed behavioral modifications, lifestyle changes, and patient support options to aid in tobacco cessation success. Patient was congratulated on their desire to make this positive change for their health. Patient instructed to keep their follow up to ensure success.   - nicotine (NICODERM CQ) 21 mg/24hr patch; Place 1 patch (21 mg total) onto the skin daily.  Dispense: 28 patch; Refill: 0   Follow up with GI for evaluation for decreased appetite and Gi symptoms. Referral status checked today and patient given information to schedule. Patient defers any treatment for mood. Believe that due to fatigue and appetite decrease that this is why he does not feel like himself. Agrees to discuss at next follow up. He will call with any questions or concerns.  Has CT for Lung cancer screening this afternoon at 4 pm.  OV was 25 minutes. Greater than 50% spent counseling and coordinating care.  Return in about 1 month (around 10/03/2017) for BP. Aliene Beamsachel Teodora Baumgarten, MD 09/05/2017

## 2017-09-05 NOTE — Telephone Encounter (Signed)
I did not get to speak to patient today as I was tied up elsewhere with another patient. Merry ProudBrandi roomed him with no way of knowing this needed to be discussed. Was this discussed in the office visit today or do I need to continue to try to reach him?

## 2017-09-05 NOTE — Telephone Encounter (Signed)
Can you please follow up on Brandon [name]'s appt to Dr Dahlia Bailiffeham.  I called Friday and left a voice mail for them to follow back up with us.

## 2017-09-05 NOTE — Telephone Encounter (Signed)
Patient has been given all information. Brandon [name] H. Tracie HarrierHagler, MD

## 2017-09-08 NOTE — Telephone Encounter (Signed)
Scheduled

## 2017-09-16 NOTE — Progress Notes (Signed)
[facility] - Clinic Note  09/17/2017     CHIEF COMPLAINT Patient presents for Retina Evaluation   HISTORY OF PRESENT ILLNESS: Brandon [name] is a 67 y.o. male who presents to the clinic today for:   HPI    Retina Evaluation    In both eyes.  This started 6 months ago.  Associated Symptoms Flashes and Floaters.  Negative for Blind Spot, Glare, Shoulder/Hip pain, Fatigue, Jaw Claudication, Photophobia, Distortion, Redness, Scalp Tenderness, Weight Loss, Fever, Trauma and Pain.  Context:  distance vision, mid-range vision and near vision.  Treatments tried include no treatments.  I, the attending physician,  performed the HPI with the patient and updated documentation appropriately.          Comments    Referral of Dr. Tracie Harrier for retina evaluation. Patient states for the last six months he has noticed floaters , flashes and blurred vision  Ou. 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 Denies ocular pain. Denies vit's/gtt's        Last edited by Rennis Chris, MD on 09/17/2017  1:33 PM. (History)    Pt states he has never "had a doctor"; Pt reports when he saw PCP he was "ran through a lot of tests"; Pt reports recently being dx with STD; Pt reports he has "never had any type of symptoms"; pt states he initially went to PCP due to BP "staying high"; Pt states he is being treated with "shots for syphilis"; Pt states he has an appointment with neurology April 26th;   Referring physician: Aliene Beams, MD [address] [place], [place] [address]  HISTORICAL INFORMATION:   Selected notes from the MEDICAL RECORD NUMBER Referred by Dr. Annie Sable for concern of neurosyphilis  LEE:  Ocular Hx- PMH- HTN, pre-diabetic, Syphilis, current smoker for 45 years    CURRENT MEDICATIONS: No current outpatient medications on file. (Ophthalmic Drugs)   No current facility-administered medications for this visit.  (Ophthalmic Drugs)    Current Outpatient Medications (Other)  Medication Sig  . amLODipine (NORVASC) 10 MG tablet Take 1 tablet (10 mg total) by mouth daily.  . nicotine (NICODERM CQ) 21 mg/24hr patch Place 1 patch (21 mg total) onto the skin daily.   No current facility-administered medications for this visit.  (Other)      REVIEW OF SYSTEMS: ROS    Positive for: Eyes   Negative for: Constitutional, Gastrointestinal, Neurological, Skin, Genitourinary, Musculoskeletal, HENT, Endocrine, Cardiovascular, Respiratory, Psychiatric, Allergic/Imm, Heme/Lymph   Last edited by Eldridge Scot, LPN on 10/29/5407  1:09 PM. (History)       ALLERGIES No Known Allergies  PAST MEDICAL HISTORY Past Medical History:  Diagnosis Date  . No pertinent past medical history    Past Surgical History:  Procedure Laterality Date  . BACK SURGERY     4 yrs ago  . COLONOSCOPY  01/22/2012   Procedure: COLONOSCOPY;  Surgeon: Malissa Hippo, MD;  [facility];  Service: Endoscopy;  Laterality: N/A;  200    FAMILY HISTORY Family History  Problem Relation Age of Onset  . Diabetes Mother   . Hypertension Mother   . Breast cancer Mother   . Alcohol abuse Father   . Breast cancer Daughter   . Colon cancer Neg Hx     SOCIAL HISTORY Social History   Tobacco Use  . Smoking status: Current Every Day Smoker    Packs/day: 1.00  Years: 45.00    Pack years: 45.00    Types: Cigarettes  . Smokeless tobacco: Never Used  . Tobacco comment: 1 pack a day  Substance Use Topics  . Alcohol use: No  . Drug use: No         OPHTHALMIC EXAM:  Base Eye Exam    Visual Acuity (Snellen - Linear)      Right Left   Dist [place] 20/40 -2 20/40 -1   Dist ph [place] 20/25 NI       Tonometry (Tonopen, 1:26 PM)      Right Left   Pressure 15 14       Pupils      Dark Light Shape React APD   Right 4 2 Round Brisk None   Left 4 2 Round Brisk None       Visual Fields (Counting fingers)      Left Right    Full Full        Extraocular Movement      Right Left    Full, Ortho Full, Ortho       Neuro/Psych    Oriented x3:  Yes   Mood/Affect:  Normal       Dilation    Both eyes:  1.0% Mydriacyl, 2.5% Phenylephrine @ 1:26 PM        Slit Lamp and Fundus Exam    Slit Lamp Exam      Right Left   Lids/Lashes Dermatochalasis - upper lid, mild Meibomian gland dysfunction Dermatochalasis - upper lid, mild Meibomian gland dysfunction   Conjunctiva/Sclera Mild Melanosis Mild Melanosis   Cornea Arcus Arcus, Inferior 1+ Punctate epithelial erosions   Anterior Chamber Deep and quiet, no cell or flare Deep and quiet   Iris Round and dilated Round and dilated   Lens 2+ Nuclear sclerosis, 2+ Cortical cataract 2+ Nuclear sclerosis, 2+ Cortical cataract   Vitreous Vitreous syneresis, Posterior vitreous detachment, mild scattered Asteroid hyalosis 360 Vitreous syneresis, Posterior vitreous detachment, mild Asteroid hyalosis, no cell       Fundus Exam      Right Left   Disc Pink and Sharp, mild Temporal Peripapillary atrophy Pink and Sharp   C/D Ratio 0.4 0.5   Macula Flat, mild Epiretinal membrane, mld Retinal pigment epithelial mottling, No heme or edema Flat, Retinal pigment epithelial mottling, No heme or edema   Vessels Mild Copper wiring, AV crossing changes Mild Copper wiring, AV crossing changes   Periphery Attached Attached, temporal peripheral drusen        Refraction    Manifest Refraction (Auto)      Sphere Cylinder Axis Dist VA   Right -1.00 +0.25 113    Left -1.00 +0.25 144        Manifest Refraction #2      Sphere Cylinder Axis Dist VA   Right -1.00 +0.50 119 20/20-2   Left -1.00` +0.25 144 20/20-2          IMAGING AND PROCEDURES  Imaging and Procedures for 09/19/17  OCT, Retina - OU - Both Eyes       Right Eye Quality was good. Central Foveal Thickness: 267. Progression has no prior data. Findings include normal foveal contour, no IRF, no SRF, vitreomacular adhesion  (Trace  ERM, ).   Left Eye Quality was good. Central Foveal Thickness: 264. Progression has no prior data. Findings include normal foveal contour, no SRF, no IRF, vitreomacular adhesion .   Notes *Images captured and stored on drive  Diagnosis /  Impression:  NFP, No IRF/SRF OU  Clinical management:  See below  Abbreviations: NFP - Normal foveal profile. CME - cystoid macular edema. PED - pigment epithelial detachment. IRF - intraretinal fluid. SRF - subretinal fluid. EZ - ellipsoid zone. ERM - epiretinal membrane. ORA - outer retinal atrophy. ORT - outer retinal tubulation. SRHM - subretinal hyper-reflective material         Fluorescein Angiography Optos (Transit OD)       Right Eye Progression has no prior data. Early phase findings include normal observations. Mid/Late phase findings include leakage.   Left Eye Progression has no prior data. Early phase findings include leakage. Mid/Late phase findings include leakage.   Notes Impression:  Late focal leakage inferotemporal OU Late mild hyperfluorescence of disc OU                ASSESSMENT/PLAN:    ICD-10-CM   1. Syphilis A53.9   2. 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 Retinal edema H35.81 OCT, Retina - OU - Both Eyes    Fluorescein Angiography Optos (Transit OD)  4. Combined forms of age-related cataract of both eyes H25.813     1. Syphilis - under the care of health department and receiving penicillin injections  2. Focal chorioretinal inflammation OU - on fluorescein angiogram 4.24.19, pt had very mild focal areas of perivascular leakage in inferotemporal quadrant, which could represent inflammation / uveitis secondary to syphilis - could also be due to HTN, smoking history - recommend lumbar puncture to rule out neurosyphilis as PCN regimen would need to be adjusted  - overall, however, pt is doing well from a vision / ocular standpoint -- pt is asymptomatic and visual acuity corrects  to 20/20 OU  3. No retinal edema on exam or OCT  4. Pseudophakia OU  - s/p CE/IOL  - beautiful surgery, doing well  - monitor   Ophthalmic Meds Ordered this visit:  No orders of the defined types were placed in this encounter.      Return in about 6 weeks (around 10/29/2017) for F/U syphilis OU, DFE, OCT.  There are no Patient Instructions on file for this visit.   Explained the diagnoses, plan, and follow up with the patient and they expressed understanding.  Patient expressed understanding of the importance of proper follow up care.   This document serves as a record of services personally performed by Karie ChimeraBrian G. Markiah Janeway, MD, PhD. It was created on their behalf by Laurian BrimAmanda Brown, OA, an ophthalmic assistant. The creation of this record is the provider's dictation and/or activities during the visit.    Electronically signed by: Laurian BrimAmanda Brown, OA  09/19/17 10:11 AM    Karie ChimeraBrian G. Carmaleta Youngers, M.D., Ph.D. Diseases & Surgery of the Retina and Vitreous [facility] 09/19/17  I have reviewed the above documentation for accuracy and completeness, and I agree with the above. Karie ChimeraBrian G. Eathan Groman, M.D., Ph.D. 09/19/17 10:11 AM     Abbreviations: M myopia (nearsighted); A astigmatism; H hyperopia (farsighted); P presbyopia; Mrx spectacle prescription;  CTL contact lenses; OD right eye; OS left eye; OU both eyes  XT exotropia; ET esotropia; PEK punctate epithelial keratitis; PEE punctate epithelial erosions; DES dry eye syndrome; MGD meibomian gland dysfunction; ATs artificial tears; PFAT's preservative free artificial tears; NSC nuclear sclerotic cataract; PSC posterior subcapsular cataract; ERM epi-retinal membrane; PVD posterior vitreous detachment; RD retinal detachment; DM diabetes mellitus; DR diabetic retinopathy; NPDR non-proliferative diabetic retinopathy; PDR proliferative diabetic retinopathy; CSME  clinically significant macular edema; DME diabetic macular edema; dbh dot  blot hemorrhages; CWS cotton wool spot; POAG primary open angle glaucoma; C/D cup-to-disc ratio; HVF humphrey visual field; GVF goldmann visual field; OCT optical coherence tomography; IOP intraocular pressure; BRVO Branch retinal vein occlusion; CRVO central retinal vein occlusion; CRAO central retinal artery occlusion; BRAO branch retinal artery occlusion; RT retinal tear; SB scleral buckle; PPV pars plana vitrectomy; VH Vitreous hemorrhage; PRP panretinal laser photocoagulation; IVK intravitreal kenalog; VMT vitreomacular traction; MH Macular hole;  NVD neovascularization of the disc; NVE neovascularization elsewhere; AREDS age related eye disease study; ARMD age related macular degeneration; POAG primary open angle glaucoma; EBMD epithelial/anterior basement membrane dystrophy; ACIOL anterior chamber intraocular lens; IOL intraocular lens; PCIOL posterior chamber intraocular lens; Phaco/IOL phacoemulsification with intraocular lens placement; PRK photorefractive keratectomy; LASIK laser assisted in situ keratomileusis; HTN hypertension; DM diabetes mellitus; COPD chronic obstructive pulmonary disease

## 2017-09-17 ENCOUNTER — Ambulatory Visit (INDEPENDENT_AMBULATORY_CARE_PROVIDER_SITE_OTHER): Payer: Medicare Other | Admitting: Ophthalmology

## 2017-09-17 ENCOUNTER — Encounter (INDEPENDENT_AMBULATORY_CARE_PROVIDER_SITE_OTHER): Payer: Self-pay | Admitting: Ophthalmology

## 2017-09-17 DIAGNOSIS — A539 Syphilis, unspecified: Secondary | ICD-10-CM | POA: Diagnosis not present

## 2017-09-17 DIAGNOSIS — H30003 Unspecified focal chorioretinal inflammation, bilateral: Secondary | ICD-10-CM | POA: Diagnosis not present

## 2017-09-17 DIAGNOSIS — H25813 Combined forms of age-related cataract, bilateral: Secondary | ICD-10-CM

## 2017-09-17 DIAGNOSIS — H3581 Retinal edema: Secondary | ICD-10-CM

## 2017-09-19 ENCOUNTER — Encounter (INDEPENDENT_AMBULATORY_CARE_PROVIDER_SITE_OTHER): Payer: Self-pay | Admitting: Ophthalmology

## 2017-09-19 ENCOUNTER — Ambulatory Visit (HOSPITAL_COMMUNITY): Admission: RE | Admit: 2017-09-19 | Payer: Medicare Other | Source: Ambulatory Visit

## 2017-09-22 ENCOUNTER — Telehealth: Payer: Self-pay | Admitting: Family Medicine

## 2017-09-22 ENCOUNTER — Encounter (INDEPENDENT_AMBULATORY_CARE_PROVIDER_SITE_OTHER): Payer: Self-pay | Admitting: Ophthalmology

## 2017-09-22 NOTE — Telephone Encounter (Signed)
Rennis Chris, Retinal Surgeon at [facility], left message on nurse line regarding patient. He states Dr. Tracie Harrier referred patient to him for a dilated eye exam to rule out any syphilis involvement. He is calling to discuss his findings- he also sent an inbasket message to Dr. Tracie Harrier but would like to speak with her also. He states there is some mild concern of inflammation/syphilis involvement bilaterally.  Cell callback# [phone]

## 2017-09-23 ENCOUNTER — Ambulatory Visit (INDEPENDENT_AMBULATORY_CARE_PROVIDER_SITE_OTHER): Payer: Medicare Other | Admitting: Internal Medicine

## 2017-09-23 ENCOUNTER — Encounter (INDEPENDENT_AMBULATORY_CARE_PROVIDER_SITE_OTHER): Payer: Self-pay | Admitting: Internal Medicine

## 2017-09-23 VITALS — BP 140/90 | HR 52 | Temp 98.1°F | Ht 76.0 in | Wt 224.4 lb

## 2017-09-23 DIAGNOSIS — I10 Essential (primary) hypertension: Secondary | ICD-10-CM | POA: Insufficient documentation

## 2017-09-23 DIAGNOSIS — R630 Anorexia: Secondary | ICD-10-CM | POA: Diagnosis not present

## 2017-09-23 NOTE — Progress Notes (Signed)
Left voicemail for patient coordinator requesting an appt asap also letting her know that we are willing to do a peer to peer.

## 2017-09-23 NOTE — Telephone Encounter (Signed)
Scheduled 10am with Dr.Yan

## 2017-09-23 NOTE — Progress Notes (Signed)
Subjective:    Patient ID: Brandon [name], male    DOB: Oct 31, 1950, 67 y.o.   MRN: [id number]  HPI Referred by Dr. Tracie Harrier for anorexia and weight loss. CT chest for lung screening has been ordered by Dr. Tracie Harrier.  States he had syphilis. Finished treatment at [facility]. States he found out about 3 months ago. He says his appetite is really not good.  He says he can eat but sometimes he can only eat a little bit.  He says he has lost 2 about pounds over the past 3 months.   In 2013 his weight by me was 224. Today his weight is 224.   He denies any abdominal pain. He is trying to quit smoking. He has had only one cigarette today. Has a Nicotine patch on. BMs are normal.  No melena or BRRB. He has a BM x 1 a day.  Retired Naval architect.  He snack all day long. Chips, donuts. Ham sandwiches. Yesterday he ate 2 ham sandwiches and powdered donuts. He says this morning around 6am he was hungry but did not have time to eat. He drinks 6-7 sodas a day.  When he eats donuts, he will eat the whole bag.  CBC    Component Value Date/Time   WBC 5.0 08/14/2017 1511   RBC 4.80 08/14/2017 1511   HGB 14.1 08/14/2017 1511   HCT 41.2 08/14/2017 1511   PLT 267 08/14/2017 1511   MCV 85.8 08/14/2017 1511   MCH 29.4 08/14/2017 1511   MCHC 34.2 08/14/2017 1511   RDW 12.2 08/14/2017 1511   LYMPHSABS 2,515 08/14/2017 1511   MONOABS 0.6 10/13/2007 1007   EOSABS 50 08/14/2017 1511   BASOSABS 30 08/14/2017 1511   08/14/2017 Hepatitis Panel: negative. HIV negative, TSH 0.68, RPR positive, Titer 1.1. ,urinalysis negative, trace of ketones.   CMP Latest Ref Rng & Units 08/14/2017 04/28/2007  Glucose 65 - 139 mg/dL 84 82  BUN 7 - 25 mg/dL 11 11  Creatinine 4.09 - 1.25 mg/dL 8.11 1.1  Sodium 914 - 146 mmol/L 145 141  Potassium 3.5 - 5.3 mmol/L 4.7 4.5  Chloride 98 - 110 mmol/L 106 110  CO2 20 - 32 mmol/L 30 -  Calcium 8.6 - 10.3 mg/dL 78.2 -  Total Protein 6.1 - 8.1 g/dL 7.5 -  Total Bilirubin  0.2 - 1.2 mg/dL 0.4 -  AST 10 - 35 U/L 16 -  ALT 9 - 46 U/L 9 -   01/22/2012 Colonoscopy: intermittent rectal bleeding.  Normal except internal/external hemorrhoids.    Review of Systems Past Medical History:  Diagnosis Date  . Hypertension   . No pertinent past medical history     Past Surgical History:  Procedure Laterality Date  . BACK SURGERY     4 yrs ago  . COLONOSCOPY  01/22/2012   Procedure: COLONOSCOPY;  Surgeon: Malissa Hippo, MD;  Location: [facility];  Service: Endoscopy;  Laterality: N/A;  200    No Known Allergies  Current Outpatient Medications on File Prior to Visit  Medication Sig Dispense Refill  . amLODipine (NORVASC) 10 MG tablet Take 1 tablet (10 mg total) by mouth daily. 90 tablet 3  . nicotine (NICODERM CQ) 21 mg/24hr patch Place 1 patch (21 mg total) onto the skin daily. 28 patch 0   No current facility-administered medications on file prior to visit.         Objective:   Physical Exam Blood pressure 140/90, pulse Marland Kitchen)  52, temperature 98.1 F (36.7 C), height  (1.93 m), weight 224 lb 6.4 oz (101.8 kg). Alert and oriented. Skin warm and dry. Oral mucosa is moist.   . Sclera anicteric, conjunctivae is pink. Thyroid not enlarged. No cervical lymphadenopathy. Lungs clear. Heart regular rate and rhythm.  Abdomen is soft. Bowel sounds are positive. No hepatomegaly. No abdominal masses felt. No tenderness.  No edema to lower extremities. Patient is alert and oriented.         Assessment & Plan:  Loss of appetite. He has supplement snack and sodas for regular meals. He was advised.  Am going to get a cortisol level. He has no GI complaints. He really has not lost any weight.

## 2017-09-23 NOTE — Patient Instructions (Signed)
Cortisol. Further recommendations to follow.

## 2017-09-23 NOTE — Telephone Encounter (Signed)
Returned call to Dr. Arlys John Zamora/  retinal specialist. Reports that after evaluation of patient, believes he is doing well from an eye perspective but dye test that looks at vasculature changes of eye that could be due to syphilis. Diagnosis at this time per his report would be neurosyphilis if the CSF is positive. Asking for [place] to get patient in to see neurology  STAT.   Told him that we would call to see about getting patient in with neurology ASAP.

## 2017-09-24 ENCOUNTER — Encounter: Payer: Self-pay | Admitting: Family Medicine

## 2017-09-24 ENCOUNTER — Encounter: Payer: Self-pay | Admitting: Neurology

## 2017-09-24 ENCOUNTER — Ambulatory Visit (INDEPENDENT_AMBULATORY_CARE_PROVIDER_SITE_OTHER): Payer: Medicare Other | Admitting: Family Medicine

## 2017-09-24 ENCOUNTER — Other Ambulatory Visit: Payer: Self-pay

## 2017-09-24 ENCOUNTER — Ambulatory Visit (INDEPENDENT_AMBULATORY_CARE_PROVIDER_SITE_OTHER): Payer: Medicare Other | Admitting: Neurology

## 2017-09-24 ENCOUNTER — Telehealth: Payer: Self-pay | Admitting: Neurology

## 2017-09-24 VITALS — BP 132/82 | HR 63 | Temp 98.5°F | Resp 16 | Ht 76.0 in | Wt 223.0 lb

## 2017-09-24 VITALS — BP 151/79 | HR 62 | Ht 76.0 in | Wt 222.0 lb

## 2017-09-24 DIAGNOSIS — I10 Essential (primary) hypertension: Secondary | ICD-10-CM

## 2017-09-24 DIAGNOSIS — A539 Syphilis, unspecified: Secondary | ICD-10-CM | POA: Diagnosis not present

## 2017-09-24 DIAGNOSIS — Z720 Tobacco use: Secondary | ICD-10-CM | POA: Diagnosis not present

## 2017-09-24 DIAGNOSIS — H3500 Unspecified background retinopathy: Secondary | ICD-10-CM | POA: Diagnosis not present

## 2017-09-24 DIAGNOSIS — R7303 Prediabetes: Secondary | ICD-10-CM

## 2017-09-24 LAB — CORTISOL: Cortisol, Plasma: 7.6 ug/dL

## 2017-09-24 NOTE — Telephone Encounter (Signed)
Patient's MRI has been sent to [facility]. DW  °

## 2017-09-24 NOTE — Patient Instructions (Addendum)
CT Scan scheduled for 10/13/17 at 4:00 pm at [facility]. Please arrive 15 minutes early for registration.

## 2017-09-24 NOTE — Progress Notes (Signed)
PATIENT: Brandon [name] DOB: Jun 04, 1950  Chief Complaint  Patient presents with  . Neurosyphilis    Reports intermittent episodes of seeing floaters, flashing of lights and blurred vision. He was recently evaluated by ophthalmology (Dr. Rennis Chris).  He is being treated by his PCP for syphilis.   Marland Kitchen PCP    Aliene Beams, MD    HISTORICAL  Brandon [name] is a 67 years old male, seen in refer by his primary care physician Dr. Tracie Harrier, [place] for evaluation of visual complaints, he is recently treated for neurosyphilis, initial evaluation was on Sep 24, 2017.  I have reviewed and summarized multiple visit by his primary care physician and ophthalmologist  He has not seen a doctor in many years, recently started with Dr. Tracie Harrier for evaluation of his complaints of fatigue, lack of appetite, he smoked 1 pack a day for 50 years, also recently diagnosed with hypertension.  During his regular laboratory evaluations in March 2019,fluorescent treponemal antibodies was reactive, with RPR titer of 1:1, normal CBC, hepatitis panel, HIV, TSH, A1c was 5.9, lipid profile showed normal LDL 92,  He was diagnosed with syphilis, was treated at [facility] department with IM penicillin every 2 weeks x 3, he is asymptomatic, in specific, deny headaches, gait abnormality, paresthesia, skin lesions,  He does has floaters, occasionally some flashlight, was seen by ophthalmologist Dr. Vanessa Barbara, there was focal chorioretinal inflammation bilaterally, with mild focal reiniovascular leakage in the inferior temporal quadrant, which could represent inflammation/uveitis secondary to syphilis, could also be due to hypertension, smoking history, his ophthalmologist Dr.Brian Vanessa Barbara has suggested lumbar puncture for further evaluation.  Overall he is doing well from vision/ocular standpoint, corrected vision is 20/20 bilaterally no retinal edema on OCT    REVIEW OF SYSTEMS: Full 14 system review of systems  performed and notable only for as above  ALLERGIES: No Known Allergies  HOME MEDICATIONS: Current Outpatient Medications  Medication Sig Dispense Refill  . amLODipine (NORVASC) 10 MG tablet Take 1 tablet (10 mg total) by mouth daily. 90 tablet 3  . nicotine (NICODERM CQ) 21 mg/24hr patch Place 1 patch (21 mg total) onto the skin daily. 28 patch 0   No current facility-administered medications for this visit.     PAST MEDICAL HISTORY: Past Medical History:  Diagnosis Date  . Hypertension   . Syphilis     PAST SURGICAL HISTORY: Past Surgical History:  Procedure Laterality Date  . BACK SURGERY     4 yrs ago  . COLONOSCOPY  01/22/2012   Procedure: COLONOSCOPY;  Surgeon: Malissa Hippo, MD;  [facility];  Service: Endoscopy;  Laterality: N/A;  200    FAMILY HISTORY: Family History  Problem Relation Age of Onset  . Diabetes Mother   . Hypertension Mother   . Breast cancer Mother   . Alcohol abuse Father   . Breast cancer Daughter   . Colon cancer Neg Hx     SOCIAL HISTORY:  Social History   Socioeconomic History  . Marital status: Married    Spouse name: Not on file  . Number of children: 3  . Years of education: 70  . Highest education level: High school graduate  Occupational History  . Occupation: truck Public librarian Needs  . Financial resource strain: Not on file  . Food insecurity:    Worry: Not on file    Inability: Not on file  . Transportation needs:    Medical: Not on file  Non-medical: Not on file  Tobacco Use  . Smoking status: Current Every Day Smoker    Years: 45.00    Types: Cigarettes  . Smokeless tobacco: Never Used  . Tobacco comment: 09/24/17 - only smoking 2 cigarettes per day - wearing nicotine patch  Substance and Sexual Activity  . Alcohol use: No  . Drug use: No  . Sexual activity: Yes    Birth control/protection: None  Lifestyle  . Physical activity:    Days per week: Not on file    Minutes per session: Not on  file  . Stress: Not on file  Relationships  . Social connections:    Talks on phone: Not on file    Gets together: Not on file    Attends religious service: Not on file    Active member of club or organization: Not on file    Attends meetings of clubs or organizations: Not on file    Relationship status: Not on file  . Intimate partner violence:    Fear of current or ex partner: Not on file    Emotionally abused: Not on file    Physically abused: Not on file    Forced sexual activity: Not on file  Other Topics Concern  . Not on file  Social History Narrative   Grew up in [place], [place]. 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    Married for 17 years. Has 3 children, 11 grandchildren, 4 great grandchildren.   Wife had breast cancer and she died eight years ago. Remarried, wife is younger.    Wears seatbelt.   Eats all food groups.    Drinks 3-4 sodas per day.   Right-handed.     PHYSICAL EXAM   Vitals:   09/24/17 1021  BP: (!) 151/79  Pulse: 62  Weight: 222 lb (100.7 kg)  Height:  (1.93 m)    Not recorded      Body mass index is 27.02 kg/m.  PHYSICAL EXAMNIATION:  Gen: NAD, conversant, well nourised, obese, well groomed                     Cardiovascular: Regular rate rhythm, no peripheral edema, warm, nontender. Eyes: Conjunctivae clear without exudates or hemorrhage Neck: Supple, no carotid bruits. Pulmonary: Clear to auscultation bilaterally   NEUROLOGICAL EXAM:  MENTAL STATUS: Speech:    Speech is normal; fluent and spontaneous with normal comprehension.  Cognition:     Orientation to time, place and person     Normal recent and remote memory     Normal Attention span and concentration     Normal Language, naming, repeating,spontaneous speech     Fund of knowledge   CRANIAL NERVES: CN II: Visual fields are full to confrontation. Fundoscopic exam is normal with sharp discs and no vascular changes. Pupils are round equal and briskly reactive  to light. CN III, IV, VI: extraocular movement are normal. No ptosis. CN V: Facial sensation is intact to pinprick in all 3 divisions bilaterally. Corneal responses are intact.  CN VII: Face is symmetric with normal eye closure and smile. CN VIII: Hearing is normal to rubbing fingers CN IX, X: Palate elevates symmetrically. Phonation is normal. CN XI: Head turning and shoulder shrug are intact CN XII: Tongue is midline with normal movements and no atrophy.  MOTOR: There is no pronator drift of out-stretched arms. Muscle bulk and tone are normal. Muscle strength is normal.  REFLEXES: Reflexes are 2+ and  symmetric at the biceps, triceps, knees, and ankles. Plantar responses are flexor.  SENSORY: Intact to light touch, pinprick, positional sensation and vibratory sensation are intact in fingers and toes.  COORDINATION: Rapid alternating movements and fine finger movements are intact. There is no dysmetria on finger-to-nose and heel-knee-shin.    GAIT/STANCE: Posture is normal. Gait is steady with normal steps, base, arm swing, and turning. Heel and toe walking are normal. Tandem gait is normal.  Romberg is absent.   DIAGNOSTIC DATA (LABS, IMAGING, TESTING) - I reviewed patient records, labs, notes, testing and imaging myself where available.   ASSESSMENT AND PLAN  Brandon [name] is a 67 y.o. male   Newly diagnosed syphilis Retinovascular hemorrhage  MRI brain  Lumbar puncture to rule out central nervous system syphilis   Levert Feinstein, M.D. Ph.D.  [facility] [address], [address] [place], [place] [address] Ph: [phone] Fax: [phone]  CC: Aliene Beams, MD

## 2017-09-24 NOTE — Progress Notes (Signed)
Patient ID: Brandon [name], male    DOB: 02-Dec-1950, 67 y.o.   MRN: [id number]  Chief Complaint  Patient presents with  . Follow-up    Allergies Patient has no known allergies.  Subjective:   Brandon [name] is a 67 y.o. male who presents to [facility] today.  HPI [name] presents here for follow up. Reports that he is still eating a lot of junk food and sweets. Eats candy, cookies, snacks all day but reports that his appetite is not good. Took BP medication this morning.  Reports he was seen by neurologist today and has an upcoming MRI of his head and lumbar puncture scheduled to evaluate for diagnosis of neurosyphilis.  Reports that his blood pressure was slightly elevated at the doctor this morning but he was very anxious.  He reports that he has cut down on his smoking.  He is smoking 2 cigarettes a day.  He is wearing his nicotine patch today.  He reports that he would like to discuss diet.  He reports he does not want to become prediabetic.  He reports his energy is somewhat better.  Mood is good.  Is active by mowing the yard and playing basketball with his grandchildren.  Denies any chest pain, shortness of breath, or swelling in his extremities.   Past Medical History:  Diagnosis Date  . Hypertension   . Syphilis     Past Surgical History:  Procedure Laterality Date  . BACK SURGERY     4 yrs ago  . COLONOSCOPY  01/22/2012   Procedure: COLONOSCOPY;  Surgeon: Malissa Hippo, MD;  [facility];  Service: Endoscopy;  Laterality: N/A;  200    Family History  Problem Relation Age of Onset  . Diabetes Mother   . Hypertension Mother   . Breast cancer Mother   . Alcohol abuse Father   . Breast cancer Daughter   . Colon cancer Neg Hx      Social History   Socioeconomic History  . Marital status: Married    Spouse name: Not on file  . Number of children: 3  . Years of education: 35  . Highest education level: High school  graduate  Occupational History  . Occupation: truck Public librarian Needs  . Financial resource strain: Not on file  . Food insecurity:    Worry: Not on file    Inability: Not on file  . Transportation needs:    Medical: Not on file    Non-medical: Not on file  Tobacco Use  . Smoking status: Current Every Day Smoker    Years: 45.00    Types: Cigarettes  . Smokeless tobacco: Never Used  . Tobacco comment: 09/24/17 - only smoking 2 cigarettes per day - wearing nicotine patch  Substance and Sexual Activity  . Alcohol use: No  . Drug use: No  . Sexual activity: Yes    Birth control/protection: None  Lifestyle  . Physical activity:    Days per week: Not on file    Minutes per session: Not on file  . Stress: Not on file  Relationships  . Social connections:    Talks on phone: Not on file    Gets together: Not on file    Attends religious service: Not on file    Active member of club or organization: Not on file    Attends meetings of clubs or organizations: Not on file    Relationship status: Not on  file  Other Topics Concern  . Not on file  Social History Narrative   Grew up in [place], [place]. 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    Married for 17 years. Has 3 children, 11 grandchildren, 4 great grandchildren.   Wife had breast cancer and she died eight years ago. Remarried, wife is younger.    Wears seatbelt.   Eats all food groups.    Drinks 3-4 sodas per day.   Right-handed.   Current Outpatient Medications on File Prior to Visit  Medication Sig Dispense Refill  . amLODipine (NORVASC) 10 MG tablet Take 1 tablet (10 mg total) by mouth daily. 90 tablet 3  . nicotine (NICODERM CQ) 21 mg/24hr patch Place 1 patch (21 mg total) onto the skin daily. 28 patch 0   No current facility-administered medications on file prior to visit.     Review of Systems  Constitutional: Negative for appetite change, chills, diaphoresis and fever.  Respiratory: Negative for cough,  choking and chest tightness.   Cardiovascular: Negative for chest pain and leg swelling.  Gastrointestinal: Negative for constipation and diarrhea.  Psychiatric/Behavioral: Negative for behavioral problems, decreased concentration, dysphoric mood and hallucinations. The patient is not nervous/anxious.      Objective:   BP 132/82 (BP Location: Left Arm, Patient Position: Sitting, Cuff Size: Normal)   Pulse 63   Temp 98.5 F (36.9 C) (Temporal)   Resp 16   Ht  (1.93 m)   Wt 223 lb (101.2 kg)   SpO2 98%   BMI 27.14 kg/m   Physical Exam Alert and oriented, no acute distress.  Appearance consistent with stated age.  Heart regular rate and rhythm.  Lungs clear to auscultation bilaterally.  Cranial nerves II through XII grossly intact.  No edema in lower extremities bilaterally.  Mood and affect euthymic.  Assessment and Plan  1. Tobacco abuse The 5 A's Model for treating Tobacco Use and Dependence was used today. I have identified and documented tobacco use status for this patient. I have urged the patient to quit tobacco use. At this time, the patient is trying to quit. I have provided patient with information regarding risks, cessation techniques, and interventions that might increase future attempts to quit smoking. I will plan on again addressing tobacco dependence at the next visit.  He will continue to use the NicoDerm patches as directed.  He understands the risks of cigarette use to his health.  He will get the CT scan of his chest for lung cancer screening in May.   2. Prediabetes Dietary changes, exercise, and risks of diabetes discussed with patient.  We discussed that his sugar intake is extremely high.  We discussed that he needs to make changes in his diet.  We also discussed that he tells me that he does not have an appetite and then he goes on to tell others that he eats candy and junk food all day.  He reports that he has been eating better and his appetite has improved  from when he first [place].  I did discuss with him the risks of his behavior and that continued dietary practices of eating bags of candy each day, drinking sodas, and eating carbs does increase his risk of diabetes.  We will plan to check his hemoglobin A1c at follow-up.  3. Essential hypertension Blood pressure well controlled today.  Suspect that his reading at the neurologist today was elevated secondary to situational anxiety.  He is to continue  to take his blood pressure medication.  We will plan to recheck labs in a few months.  Call with any questions, concerns, or worrisome symptoms. Lifestyle modifications discussed with patient including a diet emphasizing vegetables, fruits, and whole grains. Limiting intake of sodium to less than 2,400 mg per day.  Recommendations discussed include consuming low-fat dairy products, poultry, fish, legumes, non-tropical vegetable oils, and nuts; and limiting intake of sweets, sugar-sweetened beverages, and red meat. Discussed following a plan such as the Dietary Approaches to Stop Hypertension (DASH) diet. Patient to read up on this diet.    Return in about 2 months (around 11/24/2017) for follow up. Aliene Beams, MD 09/24/2017

## 2017-10-01 ENCOUNTER — Ambulatory Visit
Admission: RE | Admit: 2017-10-01 | Discharge: 2017-10-01 | Disposition: A | Discharge status: Home / Self Care | Payer: Medicare Other | Source: Ambulatory Visit | Attending: Neurology | Admitting: Neurology

## 2017-10-01 DIAGNOSIS — A539 Syphilis, unspecified: Secondary | ICD-10-CM | POA: Diagnosis not present

## 2017-10-01 DIAGNOSIS — H31093 Other chorioretinal scars, bilateral: Secondary | ICD-10-CM | POA: Diagnosis not present

## 2017-10-01 DIAGNOSIS — H3500 Unspecified background retinopathy: Secondary | ICD-10-CM

## 2017-10-01 NOTE — Discharge Instructions (Signed)

## 2017-10-06 ENCOUNTER — Telehealth: Payer: Self-pay | Admitting: Neurology

## 2017-10-06 ENCOUNTER — Telehealth: Payer: Self-pay | Admitting: *Deleted

## 2017-10-06 NOTE — Telephone Encounter (Signed)
Please call patient, MRI of the brain showed no significant abnormality,  Spinal fluid testing showed no signs of active infection, in specific, no sign of central nervous system syphilis.

## 2017-10-06 NOTE — Telephone Encounter (Signed)
CSF lab results received from [facility].  All values are wnl/fim

## 2017-10-07 NOTE — Telephone Encounter (Signed)
Spoke with [name] and reviewed below MRI and lab results.  He verbalized understanding of same and expressed appreciation for the call/fim

## 2017-10-08 ENCOUNTER — Ambulatory Visit: Payer: Medicare Other | Admitting: Family Medicine

## 2017-10-13 ENCOUNTER — Ambulatory Visit (HOSPITAL_COMMUNITY): Admission: RE | Admit: 2017-10-13 | Payer: Medicare Other | Source: Ambulatory Visit

## 2017-10-28 NOTE — Progress Notes (Addendum)
[facility] - Clinic Note  10/29/2017     CHIEF COMPLAINT Patient presents for Retina Follow Up   HISTORY OF PRESENT ILLNESS: Brandon [name] is a 67 y.o. male who presents to the clinic today for:   HPI    Retina Follow Up    Patient presents with  Other.  In both eyes.  This started 2 years ago.  Severity is mild.  Since onset it is stable.  I, the attending physician,  performed the HPI with the patient and updated documentation appropriately.          Comments    F/u Syphilis OU. Patient states since he got his new rx glasses, he does not have floaters ,flashes and his blurry vision is better.Pt did not bring new glasses with him today. Denies vit's/gtt's       Last edited by Rennis Chris, MD on 10/29/2017  3:49 PM. (History)      Referring physician: Aliene Beams, MD [address] [place], [place] [address]  HISTORICAL INFORMATION:   Selected notes from the MEDICAL RECORD NUMBER Referred by Dr. Aliene Beams for concern of neurosyphilis  LEE:  Ocular Hx- PMH- HTN, pre-diabetic, Syphilis, current smoker for 45 years    CURRENT MEDICATIONS: No current outpatient medications on file. (Ophthalmic Drugs)   No current facility-administered medications for this visit.  (Ophthalmic Drugs)   Current Outpatient Medications (Other)  Medication Sig  . amLODipine (NORVASC) 10 MG tablet Take 1 tablet (10 mg total) by mouth daily.  . nicotine (NICODERM CQ) 21 mg/24hr patch Place 1 patch (21 mg total) onto the skin daily.   No current facility-administered medications for this visit.  (Other)      REVIEW OF SYSTEMS: ROS    Positive for: Genitourinary, Eyes, Allergic/Imm   Negative for: Constitutional, Gastrointestinal, Neurological, Skin, Musculoskeletal, HENT, Endocrine, Cardiovascular, Respiratory, Psychiatric, Heme/Lymph   Last edited by Eldridge Scot, LPN on 6/0/4540  3:18 PM. (History)       ALLERGIES No Known Allergies  PAST  MEDICAL HISTORY Past Medical History:  Diagnosis Date  . Hypertension   . Syphilis    Past Surgical History:  Procedure Laterality Date  . BACK SURGERY     4 yrs ago  . COLONOSCOPY  01/22/2012   Procedure: COLONOSCOPY;  Surgeon: Malissa Hippo, MD;  Location: [facility];  Service: Endoscopy;  Laterality: N/A;  200    FAMILY HISTORY Family History  Problem Relation Age of Onset  . Diabetes Mother   . Hypertension Mother   . Breast cancer Mother   . Alcohol abuse Father   . Breast cancer Daughter   . Colon cancer Neg Hx     SOCIAL HISTORY Social History   Tobacco Use  . Smoking status: Current Every Day Smoker    Years: 45.00    Types: Cigarettes  . Smokeless tobacco: Never Used  . Tobacco comment: 09/24/17 - only smoking 2 cigarettes per day - wearing nicotine patch  Substance Use Topics  . Alcohol use: No  . Drug use: No         OPHTHALMIC EXAM:  Base Eye Exam    Visual Acuity (Snellen - Linear)      Right Left   Dist [place] 20/40 20/50   Dist ph Jud 20/30 20/30       Tonometry (Tonopen, 3:20 PM)      Right Left   Pressure 16 16  Pupils      Dark Light Shape React APD   Right 4 3 Round Brisk None   Left 4 3 Round Brisk None       Visual Fields (Counting fingers)      Left Right    Full Full       Extraocular Movement      Right Left    Full, Ortho Full, Ortho       Neuro/Psych    Oriented x3:  Yes   Mood/Affect:  Normal       Dilation    Both eyes:  1.0% Mydriacyl, Paremyd @ 3:20 PM        Slit Lamp and Fundus Exam    Slit Lamp Exam      Right Left   Lids/Lashes Dermatochalasis - upper lid, mild Meibomian gland dysfunction Dermatochalasis - upper lid, mild Meibomian gland dysfunction   Conjunctiva/Sclera Mild Melanosis Mild Melanosis   Cornea Arcus Arcus, Inferior 1+ Punctate epithelial erosions   Anterior Chamber Deep and quiet, no cell or flare Deep and quiet   Iris Round and dilated Round and dilated   Lens 2+ Nuclear  sclerosis, 2+ Cortical cataract 2+ Nuclear sclerosis, 2+ Cortical cataract   Vitreous Vitreous syneresis, Posterior vitreous detachment, mild scattered Asteroid hyalosis 360 Vitreous syneresis, Posterior vitreous detachment, mild Asteroid hyalosis, no cell       Fundus Exam      Right Left   Disc Pink and Sharp, mild Temporal Peripapillary atrophy Pink and Sharp   C/D Ratio 0.45 0.5   Macula Flat, mild Epiretinal membrane, mild Retinal pigment epithelial mottling, No heme or edema Flat, Retinal pigment epithelial mottling, mild Epiretinal membrane, No heme or edema   Vessels Mild Copper wiring, AV crossing changes Mild Copper wiring, AV crossing changes   Periphery Attached Attached, temporal peripheral drusen          IMAGING AND PROCEDURES  Imaging and Procedures for 09/19/17  OCT, Retina - OU - Both Eyes       Right Eye Quality was good. Central Foveal Thickness: 273. Progression has been stable. Findings include normal foveal contour, no IRF, no SRF, vitreomacular adhesion  (Trace ERM).   Left Eye Quality was good. Central Foveal Thickness: 269. Progression has been stable. Findings include normal foveal contour, no SRF, no IRF, vitreomacular adhesion .   Notes *Images captured and stored on drive  Diagnosis / Impression:  NFP, No IRF/SRF OU  Clinical management:  See below  Abbreviations: NFP - Normal foveal profile. CME - cystoid macular edema. PED - pigment epithelial detachment. IRF - intraretinal fluid. SRF - subretinal fluid. EZ - ellipsoid zone. ERM - epiretinal membrane. ORA - outer retinal atrophy. ORT - outer retinal tubulation. SRHM - subretinal hyper-reflective material         Fluorescein Angiography Optos (Transit OD)       Right Eye Progression has no prior data. Early phase findings include normal observations. Mid/Late phase findings include leakage.   Left Eye Progression has no prior data. Early phase findings include leakage. Mid/Late phase  findings include leakage.   Notes Impression:  Late focal leakage temporal periphery OU No significant hyperfluorescence of disc OU                ASSESSMENT/PLAN:    ICD-10-CM   1. 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 Retinal edema H35.81 OCT, Retina -  OU - Both Eyes  4. Pseudophakia of both eyes Z96.1     1. Syphilis - under the care of health department and completed penicillin injections - lumbar puncture negative for CNS/CSF involvement  2. Focal chorioretinal inflammation OU - on fluorescein angiogram 4.24.19, pt had very mild focal areas of perivascular leakage in temporal quadrant, which could represent inflammation / uveitis secondary to syphilis - underwent lumbar puncture and MRI with Neurology -- both normal - repeat FA today 6.5.19 show persistent focal leakage OU temporal quad - more likely due to HTN, smoking history - overall, however, pt is doing well from a vision / ocular standpoint -- pt is asymptomatic and visual acuity corrects to 20/20 OU - F/U 6 months  3. No retinal edema on exam or OCT  4. Pseudophakia OU  - s/p CE/IOL  - beautiful surgery, doing well  - monitor   Ophthalmic Meds Ordered this visit:  No orders of the defined types were placed in this encounter.      Return in about 6 months (around 04/30/2018) for F/U focal chorioretinal infl OU, DFE, OCT.  There are no Patient Instructions on file for this visit.   Explained the diagnoses, plan, and follow up with the patient and they expressed understanding.  Patient expressed understanding of the importance of proper follow up care.   This document serves as a record of services personally performed by Karie ChimeraBrian G. Zamora, MD, PhD. It was created on their behalf by Virgilio BellingMeredith Tamana Hatfield, COA, a certified ophthalmic assistant. The creation of this record is the  provider's dictation and/or activities during the visit.  Electronically signed by: Virgilio BellingMeredith Javier Gell, COA  06.04.19 4:26 PM    Karie ChimeraBrian G. Zamora, M.D., Ph.D. Diseases & Surgery of the Retina and Vitreous [facility]  I have reviewed the above documentation for accuracy and completeness, and I agree with the above. Karie ChimeraBrian G. Zamora, M.D., Ph.D. 10/29/17 4:27 PM    Abbreviations: M myopia (nearsighted); A astigmatism; H hyperopia (farsighted); P presbyopia; Mrx spectacle prescription;  CTL contact lenses; OD right eye; OS left eye; OU both eyes  XT exotropia; ET esotropia; PEK punctate epithelial keratitis; PEE punctate epithelial erosions; DES dry eye syndrome; MGD meibomian gland dysfunction; ATs artificial tears; PFAT's preservative free artificial tears; NSC nuclear sclerotic cataract; PSC posterior subcapsular cataract; ERM epi-retinal membrane; PVD posterior vitreous detachment; RD retinal detachment; DM diabetes mellitus; DR diabetic retinopathy; NPDR non-proliferative diabetic retinopathy; PDR proliferative diabetic retinopathy; CSME clinically significant macular edema; DME diabetic macular edema; dbh dot blot hemorrhages; CWS cotton wool spot; POAG primary open angle glaucoma; C/D cup-to-disc ratio; HVF humphrey visual field; GVF goldmann visual field; OCT optical coherence tomography; IOP intraocular pressure; BRVO Branch retinal vein occlusion; CRVO central retinal vein occlusion; CRAO central retinal artery occlusion; BRAO branch retinal artery occlusion; RT retinal tear; SB scleral buckle; PPV pars plana vitrectomy; VH Vitreous hemorrhage; PRP panretinal laser photocoagulation; IVK intravitreal kenalog; VMT vitreomacular traction; MH Macular hole;  NVD neovascularization of the disc; NVE neovascularization elsewhere; AREDS age related eye disease study; ARMD age related macular degeneration; POAG primary open angle glaucoma; EBMD epithelial/anterior basement membrane  dystrophy; ACIOL anterior chamber intraocular lens; IOL intraocular lens; PCIOL posterior chamber intraocular lens; Phaco/IOL phacoemulsification with intraocular lens placement; PRK photorefractive keratectomy; LASIK laser assisted in situ keratomileusis; HTN hypertension; DM diabetes mellitus; COPD chronic obstructive pulmonary disease

## 2017-10-29 ENCOUNTER — Encounter (INDEPENDENT_AMBULATORY_CARE_PROVIDER_SITE_OTHER): Payer: Self-pay | Admitting: Ophthalmology

## 2017-10-29 ENCOUNTER — Ambulatory Visit (INDEPENDENT_AMBULATORY_CARE_PROVIDER_SITE_OTHER): Payer: Medicare Other | Admitting: Ophthalmology

## 2017-10-29 DIAGNOSIS — A539 Syphilis, unspecified: Secondary | ICD-10-CM | POA: Diagnosis not present

## 2017-10-29 DIAGNOSIS — H3581 Retinal edema: Secondary | ICD-10-CM

## 2017-10-29 DIAGNOSIS — H30003 Unspecified focal chorioretinal inflammation, bilateral: Secondary | ICD-10-CM

## 2017-10-29 DIAGNOSIS — Z961 Presence of intraocular lens: Secondary | ICD-10-CM | POA: Diagnosis not present

## 2017-10-30 ENCOUNTER — Encounter (INDEPENDENT_AMBULATORY_CARE_PROVIDER_SITE_OTHER): Payer: Self-pay | Admitting: Ophthalmology

## 2017-10-30 ENCOUNTER — Encounter: Payer: Self-pay | Admitting: Family Medicine

## 2017-10-31 ENCOUNTER — Encounter: Payer: Self-pay | Admitting: Family Medicine

## 2017-11-03 LAB — CSF CELL COUNT WITH DIFFERENTIAL
RBC COUNT CSF: 1 {cells}/uL (ref 0–10)
WBC CSF: 1 {cells}/uL (ref 0–5)

## 2017-11-03 LAB — FUNGUS CULTURE W SMEAR
MICRO NUMBER: 90561206
SMEAR:: NONE SEEN
SPECIMEN QUALITY: ADEQUATE

## 2017-11-03 LAB — GRAM STAIN
GRAM STAIN:: NONE SEEN
MICRO NUMBER:: 90561205
SPECIMEN QUALITY: ADEQUATE

## 2017-11-03 LAB — TREPONEMA PALLIDUM ANTIBODY, IFA, CSF: TREPONEMA PALLIDUM AB,IFA: NONREACTIVE

## 2017-11-03 LAB — PROTEIN, CSF: TOTAL PROTEIN, CSF: 42 mg/dL (ref 15–60)

## 2017-11-03 LAB — VDRL, CSF: VDRL Quant, CSF: NONREACTIVE

## 2017-11-03 LAB — GLUCOSE, CSF: GLUCOSE CSF: 61 mg/dL (ref 40–80)

## 2017-11-11 ENCOUNTER — Ambulatory Visit: Payer: Self-pay | Admitting: Diagnostic Neuroimaging

## 2017-11-25 ENCOUNTER — Ambulatory Visit: Payer: Medicare Other | Admitting: Family Medicine

## 2017-12-09 ENCOUNTER — Telehealth: Payer: Self-pay | Admitting: Family Medicine

## 2017-12-09 ENCOUNTER — Ambulatory Visit: Payer: Medicare Other | Admitting: Family Medicine

## 2017-12-09 NOTE — Telephone Encounter (Signed)
Received paperowork by fax. Copied,sleeved, & placed in charge folder.

## 2017-12-23 ENCOUNTER — Encounter (INDEPENDENT_AMBULATORY_CARE_PROVIDER_SITE_OTHER): Payer: Self-pay | Admitting: Internal Medicine

## 2017-12-23 ENCOUNTER — Ambulatory Visit (INDEPENDENT_AMBULATORY_CARE_PROVIDER_SITE_OTHER): Payer: Medicare Other | Admitting: Internal Medicine

## 2018-01-13 ENCOUNTER — Other Ambulatory Visit: Payer: Self-pay

## 2018-01-13 ENCOUNTER — Encounter: Payer: Self-pay | Admitting: Family Medicine

## 2018-01-13 ENCOUNTER — Ambulatory Visit (INDEPENDENT_AMBULATORY_CARE_PROVIDER_SITE_OTHER): Payer: Medicare Other | Admitting: Family Medicine

## 2018-01-13 VITALS — BP 142/88 | HR 68 | Temp 98.4°F | Resp 12 | Ht 76.0 in | Wt 220.1 lb

## 2018-01-13 DIAGNOSIS — I10 Essential (primary) hypertension: Secondary | ICD-10-CM | POA: Diagnosis not present

## 2018-01-13 DIAGNOSIS — N529 Male erectile dysfunction, unspecified: Secondary | ICD-10-CM | POA: Diagnosis not present

## 2018-01-13 DIAGNOSIS — Z23 Encounter for immunization: Secondary | ICD-10-CM

## 2018-01-13 DIAGNOSIS — R6882 Decreased libido: Secondary | ICD-10-CM | POA: Diagnosis not present

## 2018-01-13 DIAGNOSIS — Z720 Tobacco use: Secondary | ICD-10-CM

## 2018-01-13 MED ORDER — LOSARTAN POTASSIUM 50 MG PO TABS: 50.0000 mg | ORAL_TABLET | Freq: Every day | ORAL | 0 refills | Status: DC

## 2018-01-13 MED ORDER — NICOTINE 21 MG/24HR TD PT24: 21.0000 mg | MEDICATED_PATCH | Freq: Every day | TRANSDERMAL | 0 refills | Status: DC

## 2018-01-13 NOTE — Progress Notes (Signed)
Patient ID: Brandon MoralesCharles D [name], male    DOB: [birth year], 67 y.o.   MRN: [id number]  Chief Complaint  Patient presents with  . Hypertension    follow up    Allergies Patient has no known allergies.  Subjective:   Brandon [name] is a 67 y.o. male who presents to [facility].  HPI Here for visit to follow up. He reports that starting the blood pressure medication that he has had a decrease sex drive and has not been able to sustain or maintain an erection.  He reports that he did not have these issues prior to initiating the blood pressure medication.  He reports that he also has a low sex drive and does not have a desire to have sex.  He reports that this is abnormal for him because he is always had a very active sex life and high sex drive. He reports that he is urinating normally.  Denies any lower urinary tract symptoms.  Denies any penile discharge.  Has recently been treated for neurosyphilis. Was placed on Norvasc for blood pressure in March 2019.  He reports that he has suffered with these problems since that time and would like to be evaluated.  He would like to see a urologist for evaluation. Denies any chest pain, shortness of breath, swelling in his extremities.  Reports that his energy level is still low. Also has continued to smoke cigarettes.  He reports that the nicotine patches did help and he was down to 1 cigarette a day however he ran out of the patches.  He has smoked for many years.  He is also prediabetic.  He does endorse a diet high in sugar and fat.  He reports that he has tried to improve his diet.  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  He reports that he does not feel depressed.  He reports he just feels tired.  He denies any snoring.  He reports he sleeps well at night.  He wonders if the blood pressure medication is making him tired.   Past Medical History:  Diagnosis Date  . Hypertension   . Syphilis     Past Surgical  History:  Procedure Laterality Date  . BACK SURGERY     4 yrs ago  . COLONOSCOPY  01/22/2012   Procedure: COLONOSCOPY;  Surgeon: Malissa HippoNajeeb U Rehman, MD;  Location: [facility];  Service: Endoscopy;  Laterality: N/A;  200    Family History  Problem Relation Age of Onset  . Diabetes Mother   . Hypertension Mother   . Breast cancer Mother   . Alcohol abuse Father   . Breast cancer Daughter   . Colon cancer Neg Hx      Social History   Socioeconomic History  . Marital status: Married    Spouse name: Not on file  . Number of children: 3  . Years of education: 5912  . Highest education level: High school graduate  Occupational History  . Occupation: truck Public librariandriver  Social Needs  . Financial resource strain: Not on file  . Food insecurity:    Worry: Not on file    Inability: Not on file  . Transportation needs:    Medical: Not on file    Non-medical: Not on file  Tobacco Use  . Smoking status: Current Every Day Smoker    Years: 45.00    Types: Cigarettes  . Smokeless tobacco: Never Used  . Tobacco comment: 09/24/17 -  only smoking 2 cigarettes per day - wearing nicotine patch  Substance and Sexual Activity  . Alcohol use: No  . Drug use: No  . Sexual activity: Yes    Birth control/protection: None  Lifestyle  . Physical activity:    Days per week: Not on file    Minutes per session: Not on file  . Stress: Not on file  Relationships  . Social connections:    Talks on phone: Not on file    Gets together: Not on file    Attends religious service: Not on file    Active member of club or organization: Not on file    Attends meetings of clubs or organizations: Not on file    Relationship status: Not on file  Other Topics Concern  . Not on file  Social History Narrative   Grew up in [place], [place]. 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    Married for 17 years. Has 3 children, 11 grandchildren, 4 great grandchildren.   Wife had breast cancer and she died eight years ago.  Remarried, wife is younger.    Wears seatbelt.   Eats all food groups.    Drinks 3-4 sodas per day.   Right-handed.   No current outpatient medications on file prior to visit.   No current facility-administered medications on file prior to visit.      Review of Systems  Constitutional: Positive for fatigue. Negative for activity change, appetite change, chills, diaphoresis and unexpected weight change.  HENT: Negative for trouble swallowing and voice change.   Respiratory: Negative for apnea, cough, choking, chest tightness, shortness of breath and wheezing.   Cardiovascular: Negative for chest pain, palpitations and leg swelling.  Gastrointestinal: Negative for constipation, diarrhea, nausea and vomiting.  Genitourinary: Negative for decreased urine volume, difficulty urinating, discharge, dysuria, enuresis, flank pain, frequency, genital sores, hematuria, penile pain, penile swelling, scrotal swelling, testicular pain and urgency.  Musculoskeletal: Negative for myalgias.  Skin: Negative for rash.  Neurological: Negative for dizziness, tremors, facial asymmetry, light-headedness and headaches.  Hematological: Negative for adenopathy. Does not bruise/bleed easily.  Psychiatric/Behavioral: Negative for agitation, behavioral problems, confusion, decreased concentration, dysphoric mood, hallucinations, self-injury, sleep disturbance and suicidal ideas. The patient is not nervous/anxious and is not hyperactive.   Weight has been stable.    Objective:   BP (!) 142/88 (BP Location: Left Arm, Patient Position: Sitting, Cuff Size: Large)   Pulse 68   Temp 98.4 F (36.9 C) (Temporal)   Resp 12   Ht 6\' 4"  (1.93 m)   Wt 220 lb 1.3 oz (99.8 kg)   SpO2 97% Comment: room air  BMI 26.79 kg/m   Physical Exam  Constitutional: He is oriented to person, place, and time. He appears well-developed and well-nourished.  HENT:  Head: Normocephalic and atraumatic.  Eyes: Pupils are equal, round,  and reactive to light. Conjunctivae are normal.  Neck: Normal range of motion. Neck supple.  Cardiovascular: Normal rate, regular rhythm and normal heart sounds.  No murmur heard. Pulmonary/Chest: Effort normal and breath sounds normal. No respiratory distress. He has no wheezes.  Neurological: He is alert and oriented to person, place, and time. No cranial nerve deficit.  Skin: Skin is warm and dry. Capillary refill takes less than 2 seconds.  Psychiatric: He has a normal mood and affect. His behavior is normal. Judgment and thought content normal.  Vitals reviewed.  Depression screen [facility] 2/9 01/13/2018 09/05/2017 08/14/2017 08/14/2017  Decreased Interest 3 0  3 3  Down, Depressed, Hopeless 0 0 0 3  PHQ - 2 Score 3 0 3 6  Altered sleeping 0 0 0 0  Tired, decreased energy 3 3 3 3   Change in appetite 0 3 3 3   Feeling bad or failure about yourself  0 0 0 0  Trouble concentrating 0 0 0 0  Moving slowly or fidgety/restless 0 0 0 0  Suicidal thoughts 0 0 0 0  PHQ-9 Score 6 6 9 12   Difficult doing work/chores - Somewhat difficult - Somewhat difficult    Assessment and Plan  1. Need for immunization against influenza Vaccination administered. - Flu Vaccine QUAD 36+ mos IM  2. Libido, decreased Will refer patient to urology for evaluation at this time.  He defers getting any lab test at our office.  We did discuss the work-up of decreased libido.  We discussed decreased libido versus decreased ability to maintain/sustain an erection. - Ambulatory referral to Urology  3. Essential hypertension Discontinue Norvasc. Start losartan 50 mg p.o. daily.  Follow-up for blood pressure check in 2 weeks. Patient was counseled regarding worrisome signs and symptoms of medication and if occur to call our office/seek medical help. Patient counseled in detail regarding the risks of medication. Told to call or return to clinic if develop any worrisome signs or symptoms. Patient voiced understanding.    Lifestyle modifications discussed with patient including a diet emphasizing vegetables, fruits, and whole grains. Limiting intake of sodium to less than 2,400 mg per day.  Recommendations discussed include consuming low-fat dairy products, poultry, fish, legumes, non-tropical vegetable oils, and nuts; and limiting intake of sweets, sugar-sweetened beverages, and red meat. Discussed following a plan such as the Dietary Approaches to Stop Hypertension (DASH) diet. Patient to read up on this diet.   - losartan (COZAAR) 50 MG tablet; Take 1 tablet (50 mg total) by mouth daily.  Dispense: 30 tablet; Refill: 0  4. Erectile dysfunction, unspecified erectile dysfunction type Today we did discuss erectile dysfunction.  We discussed the possibility of Viagra.  We did discuss that smoking, high blood pressure, uncontrolled blood sugar, and high cholesterol increased risk of vascular disease and erectile dysfunction.  He does wish to follow-up with urology to discuss this further and declines lab testing today.  He wishes to wait to see urology before trial of Viagra.  He does correlate erectile dysfunction and decreased libido to the time of starting his blood pressure medication.  Therefore we will discontinue the Norvasc and initiate trial of another blood pressure medication. - Ambulatory referral to Urology  5. Tobacco abuse The 5 A's Model for treating Tobacco Use and Dependence was used today. I have identified and documented tobacco use status for this patient. I have urged the patient to quit tobacco use. At this time, the patient is unwilling and not ready to attempt to quit. I have provided patient with information regarding risks, cessation techniques, and interventions that might increase future attempts to quit smoking. I will plan on again addressing tobacco dependence at the next visit. However, he does wish to decrease his tobacco use.  Therefore we will initiate the NicoDerm patches.  We did  discuss that he should not continue to smoke the same degree/amount and also use the patches. - nicotine (NICODERM CQ) 21 mg/24hr patch; Place 1 patch (21 mg total) onto the skin daily.  Dispense: 28 patch; Refill: 0  Patient defers any blood work today.  We did discuss that he will have to  have his potassium and kidney function checked at follow-up.  He was agreeable to this.  Office visit was greater than 25 minutes.  Greater than 50% of office visit spent counseling and coordinating care. Return in about 2 weeks (around 01/27/2018) for follow up BP. Aliene Beams, MD 01/13/2018

## 2018-01-27 ENCOUNTER — Ambulatory Visit (INDEPENDENT_AMBULATORY_CARE_PROVIDER_SITE_OTHER): Payer: Medicare Other | Admitting: Family Medicine

## 2018-01-27 ENCOUNTER — Encounter: Payer: Self-pay | Admitting: Family Medicine

## 2018-01-27 ENCOUNTER — Other Ambulatory Visit: Payer: Self-pay

## 2018-01-27 VITALS — BP 138/70 | HR 64 | Temp 98.6°F | Resp 12 | Ht 76.0 in | Wt 221.0 lb

## 2018-01-27 DIAGNOSIS — Z720 Tobacco use: Secondary | ICD-10-CM

## 2018-01-27 DIAGNOSIS — I10 Essential (primary) hypertension: Secondary | ICD-10-CM

## 2018-01-27 MED ORDER — LOSARTAN POTASSIUM 50 MG PO TABS: 50.0000 mg | ORAL_TABLET | Freq: Every day | ORAL | 3 refills | Status: DC

## 2018-01-27 NOTE — Progress Notes (Signed)
Patient ID: Brandon [name], male    DOB: 1951/03/16, 66 y.o.   MRN: [id number]  Chief Complaint  Patient presents with  . 2 week BP check    Allergies Patient has no known allergies.  Subjective:   Brandon [name] is a 67 y.o. male who presents to [facility] today.  HPI Mr. Schnabel comes in today for check on his blood pressure.  He has been on losartan 50 mg p.o. daily for several weeks.  Reports blood pressures been running better.  Reports that he feels better.  Since stopping the Norvasc has been able to achieve erection.  Reports that his sex drive is still not as good and would like to follow-up with urology.  He reports he did not receive the referral information despite the referral being placed.  He has not had any side effects with the losartan.  Denies any chest pain, shortness of breath, lower extremity edema.  Denies any palpitations.  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  Is still continuing to smoke cigarettes.  He reports he has been trying to improve his diet and eat less candy and drink less sodas.  Denies alcohol or drug use.   Past Medical History:  Diagnosis Date  . Hypertension   . Syphilis     Past Surgical History:  Procedure Laterality Date  . BACK SURGERY     4 yrs ago  . COLONOSCOPY  01/22/2012   Procedure: COLONOSCOPY;  Surgeon: Malissa Hippo, MD;  Location: [facility];  Service: Endoscopy;  Laterality: N/A;  200    Family History  Problem Relation Age of Onset  . Diabetes Mother   . Hypertension Mother   . Breast cancer Mother   . Alcohol abuse Father   . Breast cancer Daughter   . Colon cancer Neg Hx      Social History   Socioeconomic History  . Marital status: Married    Spouse name: Not on file  . Number of children: 3  . Years of education: 13  . Highest education level: High school graduate  Occupational History  . Occupation: truck Public librarian Needs  . Financial  resource strain: Not on file  . Food insecurity:    Worry: Not on file    Inability: Not on file  . Transportation needs:    Medical: Not on file    Non-medical: Not on file  Tobacco Use  . Smoking status: Current Every Day Smoker    Years: 45.00    Types: Cigarettes  . Smokeless tobacco: Never Used  . Tobacco comment: 09/24/17 - only smoking 2 cigarettes per day - wearing nicotine patch  Substance and Sexual Activity  . Alcohol use: No  . Drug use: No  . Sexual activity: Yes    Birth control/protection: None  Lifestyle  . Physical activity:    Days per week: Not on file    Minutes per session: Not on file  . Stress: Not on file  Relationships  . Social connections:    Talks on phone: Not on file    Gets together: Not on file    Attends religious service: Not on file    Active member of club or organization: Not on file    Attends meetings of clubs or organizations: Not on file    Relationship status: Not on file  Other Topics Concern  . Not on file  Social  History Narrative   Grew up in [place], [place]. 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    Married for 17 years. Has 3 children, 11 grandchildren, 4 great grandchildren.   Wife had breast cancer and she died eight years ago. Remarried, wife is younger.    Wears seatbelt.   Eats all food groups.    Drinks 3-4 sodas per day.   Right-handed.   Current Outpatient Medications on File Prior to Visit  Medication Sig Dispense Refill  . nicotine (NICODERM CQ) 21 mg/24hr patch Place 1 patch (21 mg total) onto the skin daily. 28 patch 0   No current facility-administered medications on file prior to visit.     Review of Systems  Constitutional: Negative for appetite change, chills, fever and unexpected weight change.  HENT: Negative for trouble swallowing and voice change.   Eyes: Negative for visual disturbance.  Respiratory: Negative for cough, chest tightness, shortness of breath and wheezing.   Cardiovascular:  Negative for chest pain, palpitations and leg swelling.  Gastrointestinal: Negative for abdominal pain, diarrhea, nausea and vomiting.  Genitourinary: Negative for decreased urine volume, dysuria and frequency.       Reports that he does have low sex drive.  Skin: Negative for rash.  Neurological: Negative for dizziness, tremors, syncope, facial asymmetry, weakness and headaches.  Hematological: Negative for adenopathy. Does not bruise/bleed easily.     Objective:   BP 138/70 (BP Location: Right Arm, Patient Position: Sitting, Cuff Size: Large)   Pulse 64   Temp 98.6 F (37 C) (Temporal)   Resp 12   Ht 6\' 4"  (1.93 m)   Wt 221 lb (100.2 kg)   SpO2 98% Comment: room air  BMI 26.90 kg/m   Physical Exam  Constitutional: He is oriented to person, place, and time. He appears well-developed and well-nourished.  HENT:  Head: Normocephalic and atraumatic.  Eyes: Pupils are equal, round, and reactive to light. Conjunctivae and EOM are normal.  Neck: Normal range of motion. Neck supple. No JVD present. No thyromegaly present.  Cardiovascular: Normal rate, regular rhythm and normal heart sounds.  No murmur heard. Pulmonary/Chest: Effort normal and breath sounds normal. He has no wheezes.  Musculoskeletal: He exhibits no edema.  Neurological: He is alert and oriented to person, place, and time. No cranial nerve deficit.  Skin: Skin is warm, dry and intact.  Psychiatric: He has a normal mood and affect. His behavior is normal. Judgment and thought content normal.  Vitals reviewed.    Assessment and Plan  1. Essential hypertension Blood pressure improved.  Continue medication as directed.  Check BMP today.  Tobacco cessation recommended.  Patient counseled regarding the risks of tobacco smoking. Dietary modifications including low-salt, decrease sugar, and low-cholesterol diet discussed. - losartan (COZAAR) 50 MG tablet; Take 1 tablet (50 mg total) by mouth daily.  Dispense: 30 tablet;  Refill: 3 - Basic metabolic panel  Today, we did check on patient's referral and the referral to urology has been placed.  He was given the information he can call [facility] to schedule his appointment.  He defers laboratory testing for prostate and testosterone and wishes to get his blood work performed by the urologist. It is recommended that he establish care follow-up with new PCP office in the next 2 to 3 months. No follow-ups on file. Aliene Beams, MD 01/27/2018

## 2018-01-27 NOTE — Patient Instructions (Signed)
Steps to Quit Smoking Smoking tobacco can be bad for your health. It can also affect almost every organ in your body. Smoking puts you and people around you at risk for many serious long-lasting (chronic) diseases. Quitting smoking is hard, but it is one of the best things that you can do for your health. It is never too late to quit. What are the benefits of quitting smoking? When you quit smoking, you lower your risk for getting serious diseases and conditions. They can include:  Lung cancer or lung disease.  Heart disease.  Stroke.  Heart attack.  Not being able to have children (infertility).  Weak bones (osteoporosis) and broken bones (fractures).  If you have coughing, wheezing, and shortness of breath, those symptoms may get better when you quit. You may also get sick less often. If you are pregnant, quitting smoking can help to lower your chances of having a baby of low birth weight. What can I do to help me quit smoking? Talk with your doctor about what can help you quit smoking. Some things you can do (strategies) include:  Quitting smoking totally, instead of slowly cutting back how much you smoke over a period of time.  Going to in-person counseling. You are more likely to quit if you go to many counseling sessions.  Using resources and support systems, such as: ? Online chats with a counselor. ? Phone quitlines. ? Printed self-help materials. ? Support groups or group counseling. ? Text messaging programs. ? Mobile phone apps or applications.  Taking medicines. Some of these medicines may have nicotine in them. If you are pregnant or breastfeeding, do not take any medicines to quit smoking unless your doctor says it is okay. Talk with your doctor about counseling or other things that can help you.  Talk with your doctor about using more than one strategy at the same time, such as taking medicines while you are also going to in-person counseling. This can help make  quitting easier. What things can I do to make it easier to quit? Quitting smoking might feel very hard at first, but there is a lot that you can do to make it easier. Take these steps:  Talk to your family and friends. Ask them to support and encourage you.  Call phone quitlines, reach out to support groups, or work with a counselor.  Ask people who smoke to not smoke around you.  Avoid places that make you want (trigger) to smoke, such as: ? Bars. ? Parties. ? Smoke-break areas at work.  Spend time with people who do not smoke.  Lower the stress in your life. Stress can make you want to smoke. Try these things to help your stress: ? Getting regular exercise. ? Deep-breathing exercises. ? Yoga. ? Meditating. ? Doing a body scan. To do this, close your eyes, focus on one area of your body at a time from head to toe, and notice which parts of your body are tense. Try to relax the muscles in those areas.  Download or buy apps on your mobile phone or tablet that can help you stick to your quit plan. There are many free apps, such as QuitGuide from the CDC (Centers for Disease Control and Prevention). You can find more support from smokefree.gov and other websites.  This information is not intended to replace advice given to you by your health care provider. Make sure you discuss any questions you have with your health care provider. Document Released: 03/09/2009 Document   Revised: 01/09/2016 Document Reviewed: 09/27/2014 Elsevier Interactive Patient Education  2018 [employer]  

## 2018-01-28 ENCOUNTER — Encounter: Payer: Self-pay | Admitting: Family Medicine

## 2018-01-28 LAB — BASIC METABOLIC PANEL
BUN: 14 mg/dL (ref 7–25)
CO2: 31 mmol/L (ref 20–32)
CREATININE: 1.08 mg/dL (ref 0.70–1.25)
Calcium: 9.7 mg/dL (ref 8.6–10.3)
Chloride: 105 mmol/L (ref 98–110)
GLUCOSE: 92 mg/dL (ref 65–139)
Potassium: 4.9 mmol/L (ref 3.5–5.3)
SODIUM: 142 mmol/L (ref 135–146)

## 2018-03-11 ENCOUNTER — Ambulatory Visit (INDEPENDENT_AMBULATORY_CARE_PROVIDER_SITE_OTHER): Payer: Medicare Other | Admitting: Urology

## 2018-03-11 DIAGNOSIS — E291 Testicular hypofunction: Secondary | ICD-10-CM

## 2018-03-11 DIAGNOSIS — N5201 Erectile dysfunction due to arterial insufficiency: Secondary | ICD-10-CM

## 2018-03-12 ENCOUNTER — Encounter: Payer: Self-pay | Admitting: Family Medicine

## 2018-03-12 DIAGNOSIS — E291 Testicular hypofunction: Secondary | ICD-10-CM | POA: Diagnosis not present

## 2018-04-08 ENCOUNTER — Encounter: Payer: Self-pay | Admitting: Family Medicine

## 2018-04-08 ENCOUNTER — Ambulatory Visit (INDEPENDENT_AMBULATORY_CARE_PROVIDER_SITE_OTHER): Payer: Medicare Other | Admitting: Family Medicine

## 2018-04-08 ENCOUNTER — Other Ambulatory Visit: Payer: Self-pay

## 2018-04-08 VITALS — BP 134/82 | HR 64 | Temp 98.5°F | Resp 14 | Ht 76.0 in | Wt 224.0 lb

## 2018-04-08 DIAGNOSIS — Z720 Tobacco use: Secondary | ICD-10-CM | POA: Diagnosis not present

## 2018-04-08 DIAGNOSIS — I10 Essential (primary) hypertension: Secondary | ICD-10-CM | POA: Diagnosis not present

## 2018-04-08 DIAGNOSIS — R7303 Prediabetes: Secondary | ICD-10-CM | POA: Diagnosis not present

## 2018-04-08 DIAGNOSIS — Z7689 Persons encountering health services in other specified circumstances: Secondary | ICD-10-CM

## 2018-04-08 NOTE — Progress Notes (Signed)
Patient ID: Brandon [name], male    DOB: 04-20-1951, 67 y.o.   MRN: [id number]  PCP: Patient, No Pcp Per  Chief Complaint  Patient presents with  . New Patient~ Establish Care    was seeing Dr. Tracie Harrier at [facility]    Subjective:   Brandon [name] is a 67 y.o. male, presents to clinic to established care, pt of Dr. Tracie Harrier at [facility].   Has a history of hypertension, takes BP meds without difficulty, is on losartan 50 mg and amlodipine 10 mg    [place] PCP -recent visits available in this EMR -reviewed most recent records [place] eye doctor -has retinal vascular abnormality Urology - [facility] Ace -states he recently established with urology to evaluate for erectile dysfunction.  He went in and did test but he states he is never heard back about the results.  Per chart review patient also has history of prediabetes and syphilis  He is sexually active male states that he has more than one partner over the last year.  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  He denies any urinary symptoms such as dysuria, hematuria, testicular pain, scrotal swelling, genital rash or lesions.  He is a current smoker about half pack of cigarettes every day, he has been trying to cut down.  Family history of cardiovascular disease - father had MI, mother had breast cancer, was on dialysis, brother who is living has hypertension  Patient Active Problem List   Diagnosis Date Noted  . Retinal vascular abnormality 09/24/2017  . Hypertension   . Tobacco abuse 09/05/2017  . Essential hypertension 09/05/2017  . Prediabetes 08/22/2017  . Syphilis 08/22/2017  . HTN, goal below 140/90 08/22/2017  . Rectal bleeding 12/30/2011     Prior to Admission medications   Medication Sig Start Date End Date Taking? Authorizing Provider  amLODipine (NORVASC) 10 MG tablet Take 10 mg by mouth daily. 03/17/18  Yes [provider]  losartan (COZAAR) 50  MG tablet Take 1 tablet (50 mg total) by mouth daily. 01/27/18  Yes Aliene Beams, MD     No Known Allergies   Family History  Problem Relation Age of Onset  . Diabetes Mother   . Hypertension Mother   . Breast cancer Mother   . Alcohol abuse Father   . Breast cancer Daughter   . Colon cancer Neg Hx      Social History   Socioeconomic History  . Marital status: Legally Separated    Spouse name: Not on file  . Number of children: 3  . Years of education: 23  . Highest education level: High school graduate  Occupational History  . Occupation: Retired- Chiropractor  . Financial resource strain: Not hard at all  . Food insecurity:    Worry: Never true    Inability: Never true  . Transportation needs:    Medical: No    Non-medical: No  Tobacco Use  . Smoking status: Current Every Day Smoker    Years: 45.00    Types: Cigarettes  . Smokeless tobacco: Never Used  Substance and Sexual Activity  . Alcohol use: No  . Drug use: No  . Sexual activity: Yes    Birth control/protection: None  Lifestyle  . Physical activity:    Days per week: 3 days    Minutes per session: 60 min  . Stress: Not at all  Relationships  . Social connections:  Talks on phone: More than three times a week    Gets together: More than three times a week    Attends religious service: Never    Active member of club or organization: No    Attends meetings of clubs or organizations: Never    Relationship status: Separated  . Intimate partner violence:    Fear of current or ex partner: No    Emotionally abused: No    Physically abused: No    Forced sexual activity: No  Other Topics Concern  . Not on file  Social History Narrative   Grew up in [place], [place]. 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    Married for 17 years. Has 3 children, 11 grandchildren, 4 great grandchildren.   Wife had breast cancer and she died eight years ago. Remarried, wife is younger.    Wears seatbelt.     Eats all food groups.    Drinks 3-4 sodas per day.   Right-handed.     Review of Systems  Constitutional: Negative.  Negative for activity change, appetite change, fatigue and unexpected weight change.  HENT: Negative.   Eyes: Negative.   Respiratory: Negative.  Negative for shortness of breath.   Cardiovascular: Negative.  Negative for chest pain, palpitations and leg swelling.  Gastrointestinal: Negative.  Negative for abdominal pain and blood in stool.  Endocrine: Negative.   Genitourinary: Negative.  Negative for decreased urine volume, difficulty urinating, testicular pain and urgency.  Skin: Negative.  Negative for color change and pallor.  Allergic/Immunologic: Negative.   Neurological: Negative.  Negative for syncope, weakness, light-headedness and numbness.  Psychiatric/Behavioral: Negative.  Negative for confusion, dysphoric mood, self-injury and suicidal ideas. The patient is not nervous/anxious.   All other systems reviewed and are negative.      Objective:    Vitals:   04/08/18 1452  BP: 134/82  Pulse: 64  Resp: 14  Temp: 98.5 F (36.9 C)  TempSrc: Oral  SpO2: 97%  Weight: 224 lb (101.6 kg)  Height: 6\' 4"  (1.93 m)      Physical Exam  Constitutional: He is oriented to person, place, and time. He appears well-developed and well-nourished.  Non-toxic appearance. He does not appear ill. No distress.  HENT:  Head: Normocephalic and atraumatic.  Right Ear: Tympanic membrane, external ear and ear canal normal.  Left Ear: Tympanic membrane, external ear and ear canal normal.  Nose: Nose normal. No mucosal edema or rhinorrhea. Right sinus exhibits no maxillary sinus tenderness and no frontal sinus tenderness. Left sinus exhibits no maxillary sinus tenderness and no frontal sinus tenderness.  Mouth/Throat: Uvula is midline and oropharynx is clear and moist. No trismus in the jaw. No uvula swelling. No oropharyngeal exudate, posterior oropharyngeal edema or  posterior oropharyngeal erythema.  Eyes: Pupils are equal, round, and reactive to light. Conjunctivae, EOM and lids are normal. No scleral icterus.  Neck: Trachea normal, normal range of motion and phonation normal. Neck supple. No tracheal deviation present. No thyromegaly present.  Cardiovascular: Normal rate, regular rhythm, normal heart sounds, intact distal pulses and normal pulses. Exam reveals no gallop and no friction rub.  No murmur heard. Pulses:      Radial pulses are 2+ on the right side, and 2+ on the left side.       Posterior tibial pulses are 2+ on the right side, and 2+ on the left side.  Pulmonary/Chest: Effort normal and breath sounds normal. No stridor. No respiratory distress. He has  no wheezes. He has no rhonchi. He has no rales.  Abdominal: Soft. Normal appearance and bowel sounds are normal. He exhibits no distension. There is no tenderness. There is no rebound and no guarding.  Musculoskeletal: Normal range of motion. He exhibits no edema.  Neurological: He is alert and oriented to person, place, and time. Coordination and gait normal.  Skin: Skin is warm, dry and intact. Capillary refill takes less than 2 seconds. No rash noted. He is not diaphoretic.  Psychiatric: He has a normal mood and affect. His speech is normal and behavior is normal.  Nursing note and vitals reviewed.         Assessment & Plan:      ICD-10-CM   1. Essential hypertension I10 COMPLETE METABOLIC PANEL WITH GFR    CBC with Differential/Platelet    Hemoglobin A1c    Lipid panel   near goal, compliant with meds, no SE, check labs  2. Prediabetes R73.03 COMPLETE METABOLIC PANEL WITH GFR    CBC with Differential/Platelet    Hemoglobin A1c    Lipid panel   check fasting labs and A1C  3. Tobacco abuse Z72.0    encouraged to quit, pt wants to, but has difficulty with addiction to it and habit of it  4. Encounter to establish care with new doctor Z76.89    review PCP records, obtain urology  records         Danelle BerryLeisa Rohn Fritsch, PA-C 04/08/18 3:18 PM

## 2018-04-14 ENCOUNTER — Encounter: Payer: Self-pay | Admitting: Family Medicine

## 2018-04-16 ENCOUNTER — Encounter: Payer: Self-pay | Admitting: Family Medicine

## 2018-04-16 NOTE — Progress Notes (Signed)
Records received from [facility]rology for new patient who did mention he recently had a evaluation for erectile dysfunction  He was seen and evaluated by Dr. Wilkie AyePatrick [name]  He was diagnosed with erectile dysfunction due to arterial insufficiency and primary hypogonadism  Patient was scheduled for 3 months repeat labs with CMP, testosterone and CBC and pending testosterone patient was supposed to follow-up on 06/17/2018 and they will discuss initiating ED therapy

## 2018-05-05 ENCOUNTER — Encounter (INDEPENDENT_AMBULATORY_CARE_PROVIDER_SITE_OTHER): Payer: Medicare Other | Admitting: Ophthalmology

## 2018-05-18 ENCOUNTER — Telehealth: Payer: Self-pay | Admitting: Family Medicine

## 2018-05-18 DIAGNOSIS — I10 Essential (primary) hypertension: Secondary | ICD-10-CM

## 2018-05-18 MED ORDER — LOSARTAN POTASSIUM 50 MG PO TABS: 50.0000 mg | ORAL_TABLET | Freq: Every day | ORAL | 3 refills | Status: DC

## 2018-05-18 NOTE — Telephone Encounter (Signed)
Pt needs refill losartan to [facility] [place]

## 2018-05-18 NOTE — Telephone Encounter (Signed)
Spoke with patient and informed her of losartan was sent into pharmacy

## 2018-05-31 NOTE — Progress Notes (Signed)
[facility] - Clinic Note  06/01/2018     CHIEF COMPLAINT Patient presents for Retina Follow Up   HISTORY OF PRESENT ILLNESS: Brandon [name] is a 68 y.o. male who presents to the clinic today for:   HPI    Retina Follow Up    Patient presents with  Other.  In both eyes.  This started 6 months ago.  Severity is moderate.  Duration of 6 months.  Since onset it is stable.  I, the attending physician,  performed the HPI with the patient and updated documentation appropriately.          Comments    Patient here for 6 month syphilis/ Chorioretinal inflammation OU. Patient states vision doing alright. No eye pain. Has cataracts       Last edited by Rennis Chris, MD on 06/01/2018  3:55 PM. (History)    pt states his vision is doing well  Referring physician: Aliene Beams, MD [phone] [address] [place], [place] [address]  HISTORICAL INFORMATION:   Selected notes from the MEDICAL RECORD NUMBER Referred by Dr. Aliene Beams for concern of neurosyphilis  LEE:  Ocular Hx- PMH- HTN, pre-diabetic, Syphilis, current smoker for 45 years    CURRENT MEDICATIONS: No current outpatient medications on file. (Ophthalmic Drugs)   No current facility-administered medications for this visit.  (Ophthalmic Drugs)   Current Outpatient Medications (Other)  Medication Sig  . amLODipine (NORVASC) 10 MG tablet Take 10 mg by mouth daily.  Marland Kitchen losartan (COZAAR) 50 MG tablet Take 1 tablet (50 mg total) by mouth daily.   No current facility-administered medications for this visit.  (Other)      REVIEW OF SYSTEMS: ROS    Positive for: Genitourinary, Eyes, Allergic/Imm   Negative for: Constitutional, Gastrointestinal, Neurological, Skin, Musculoskeletal, HENT, Endocrine, Cardiovascular, Respiratory, Psychiatric, Heme/Lymph   Last edited by Rennis Chris, MD on 06/03/2018  1:25 AM. (History)       ALLERGIES No Known Allergies  PAST MEDICAL HISTORY Past Medical History:   Diagnosis Date  . Hypertension   . Syphilis    Past Surgical History:  Procedure Laterality Date  . BACK SURGERY     4 yrs ago  . COLONOSCOPY  01/22/2012   Procedure: COLONOSCOPY;  Surgeon: Malissa Hippo, MD;  Location: [facility];  Service: Endoscopy;  Laterality: N/A;  200    FAMILY HISTORY Family History  Problem Relation Age of Onset  . Diabetes Mother   . Hypertension Mother   . Breast cancer Mother   . Alcohol abuse Father   . Aneurysm Father   . Breast cancer Daughter   . Colon cancer Neg Hx     SOCIAL HISTORY Social History   Tobacco Use  . Smoking status: Current Every Day Smoker    Packs/day: 0.50    Years: 45.00    Pack years: 22.50    Types: Cigarettes  . Smokeless tobacco: Never Used  Substance Use Topics  . Alcohol use: No  . Drug use: No         OPHTHALMIC EXAM:  Base Eye Exam    Visual Acuity (Snellen - Linear)      Right Left   Dist [place] 20/20 20/40 +1   Dist ph [place]  20/20 -2       Tonometry (Tonopen, 3:07 PM)      Right Left   Pressure 17 17       Pupils      Dark Light  Shape React APD   Right 4 3 Round Brisk None   Left 4 3 Round Brisk None       Visual Fields (Counting fingers)      Left Right    Full Full       Extraocular Movement      Right Left    Full, Ortho Full, Ortho       Neuro/Psych    Oriented x3:  Yes   Mood/Affect:  Normal       Dilation    Both eyes:  1.0% Mydriacyl, 2.5% Phenylephrine @ 3:07 PM        Slit Lamp and Fundus Exam    Slit Lamp Exam      Right Left   Lids/Lashes Dermatochalasis - upper lid, mild Meibomian gland dysfunction Dermatochalasis - upper lid, mild Meibomian gland dysfunction   Conjunctiva/Sclera Mild Melanosis Mild Melanosis   Cornea Arcus, 1-2+ inferior Punctate epithelial erosions Arcus, Inferior 1+ Punctate epithelial erosions   Anterior Chamber Deep and quiet, no cell or flare Deep and quiet   Iris Round and dilated Round and dilated   Lens 2+ Nuclear sclerosis, 2+  Cortical cataract 2+ Nuclear sclerosis, 2+ Cortical cataract   Vitreous Vitreous syneresis, Posterior vitreous detachment, mild scattered Asteroid hyalosis 360 Vitreous syneresis, Posterior vitreous detachment, mild Asteroid hyalosis, no cell       Fundus Exam      Right Left   Disc Pink and Sharp, mild Temporal Peripapillary atrophy, +SVP Pink and Sharp, mild tilt, temporal PPA   C/D Ratio 0.45 0.5   Macula Flat, mild Epiretinal membrane, mild Retinal pigment epithelial mottling, No heme or edema Flat, Retinal pigment epithelial mottling, mild Epiretinal membrane, No heme or edema   Vessels Mild Copper wiring, AV crossing changes, Vascular attenuation Mild Copper wiring, AV crossing changes, Vascular attenuation   Periphery Attached Attached, nasal peripheral drusen          IMAGING AND PROCEDURES  Imaging and Procedures for 09/19/17  OCT, Retina - OU - Both Eyes       Right Eye Quality was good. Central Foveal Thickness: 267. Progression has been stable. Findings include normal foveal contour, no IRF, no SRF, vitreomacular adhesion  (Trace ERM).   Left Eye Quality was good. Central Foveal Thickness: 263. Progression has been stable. Findings include normal foveal contour, no SRF, no IRF, vitreomacular adhesion .   Notes *Images captured and stored on drive  Diagnosis / Impression:  NFP, No IRF/SRF OU  Clinical management:  See below  Abbreviations: NFP - Normal foveal profile. CME - cystoid macular edema. PED - pigment epithelial detachment. IRF - intraretinal fluid. SRF - subretinal fluid. EZ - ellipsoid zone. ERM - epiretinal membrane. ORA - outer retinal atrophy. ORT - outer retinal tubulation. SRHM - subretinal hyper-reflective material                  ASSESSMENT/PLAN:    ICD-10-CM   1. Syphilis A53.9   2. Essential hypertension I10   3. Hypertensive retinopathy of both eyes H35.033   4. Retinal edema H35.81 OCT, Retina - OU - Both Eyes  5. Combined  forms of age-related cataract of both eyes H25.813     1. Syphilis - under the care of health department and completed penicillin injections - lumbar puncture negative (05.08.19) for CNS/CSF involvement - no ocular involvement  2,3. Hypertensive Retinopathy OU - discussed importance of tight BP control - monitor - on fluorescein angiogram 4.24.19, pt had very  mild focal areas of perivascular leakage in temporal quadrant, which could represent inflammation / uveitis secondary to syphilis - underwent lumbar puncture and MRI with Neurology -- both normal (05.08.19) - repeat FA (6.5.19) show persistent focal leakage OU temporal quad - more likely due to HTN, smoking history - overall, however, pt is doing well from a vision / ocular standpoint -- pt is still asymptomatic and visual acuity still corrects to 20/20 OU - F/U 1 year  4. No retinal edema on exam or OCT  5. Cataract OU - The symptoms of cataract, surgical options, and treatments and risks were discussed with patient. - discussed diagnosis and progression - not yet visually significant - monitor for now    Ophthalmic Meds Ordered this visit:  No orders of the defined types were placed in this encounter.      Return in about 1 year (around 06/02/2019) for f/u chorioretinal inflammation OU.  There are no Patient Instructions on file for this visit.   Explained the diagnoses, plan, and follow up with the patient and they expressed understanding.  Patient expressed understanding of the importance of proper follow up care.   This document serves as a record of services personally performed by Karie ChimeraBrian G. Jeffre Enriques, MD, PhD. It was created on their behalf by Laurian BrimAmanda Brown, OA, an ophthalmic assistant. The creation of this record is the provider's dictation and/or activities during the visit.    Electronically signed by: Laurian BrimAmanda Brown, OA  01.05.2020 1:28 AM     Karie ChimeraBrian G. Nazli Penn, M.D., Ph.D. Diseases & Surgery of the Retina and  Vitreous [facility]  I have reviewed the above documentation for accuracy and completeness, and I agree with the above. Karie ChimeraBrian G. Kalana Yust, M.D., Ph.D. 06/03/18 1:28 AM    Abbreviations: M myopia (nearsighted); A astigmatism; H hyperopia (farsighted); P presbyopia; Mrx spectacle prescription;  CTL contact lenses; OD right eye; OS left eye; OU both eyes  XT exotropia; ET esotropia; PEK punctate epithelial keratitis; PEE punctate epithelial erosions; DES dry eye syndrome; MGD meibomian gland dysfunction; ATs artificial tears; PFAT's preservative free artificial tears; NSC nuclear sclerotic cataract; PSC posterior subcapsular cataract; ERM epi-retinal membrane; PVD posterior vitreous detachment; RD retinal detachment; DM diabetes mellitus; DR diabetic retinopathy; NPDR non-proliferative diabetic retinopathy; PDR proliferative diabetic retinopathy; CSME clinically significant macular edema; DME diabetic macular edema; dbh dot blot hemorrhages; CWS cotton wool spot; POAG primary open angle glaucoma; C/D cup-to-disc ratio; HVF humphrey visual field; GVF goldmann visual field; OCT optical coherence tomography; IOP intraocular pressure; BRVO Branch retinal vein occlusion; CRVO central retinal vein occlusion; CRAO central retinal artery occlusion; BRAO branch retinal artery occlusion; RT retinal tear; SB scleral buckle; PPV pars plana vitrectomy; VH Vitreous hemorrhage; PRP panretinal laser photocoagulation; IVK intravitreal kenalog; VMT vitreomacular traction; MH Macular hole;  NVD neovascularization of the disc; NVE neovascularization elsewhere; AREDS age related eye disease study; ARMD age related macular degeneration; POAG primary open angle glaucoma; EBMD epithelial/anterior basement membrane dystrophy; ACIOL anterior chamber intraocular lens; IOL intraocular lens; PCIOL posterior chamber intraocular lens; Phaco/IOL phacoemulsification with intraocular lens placement; PRK photorefractive  keratectomy; LASIK laser assisted in situ keratomileusis; HTN hypertension; DM diabetes mellitus; COPD chronic obstructive pulmonary disease

## 2018-06-01 ENCOUNTER — Ambulatory Visit (INDEPENDENT_AMBULATORY_CARE_PROVIDER_SITE_OTHER): Payer: Medicare Other | Admitting: Ophthalmology

## 2018-06-01 DIAGNOSIS — H35033 Hypertensive retinopathy, bilateral: Secondary | ICD-10-CM

## 2018-06-01 DIAGNOSIS — A539 Syphilis, unspecified: Secondary | ICD-10-CM | POA: Diagnosis not present

## 2018-06-01 DIAGNOSIS — H3581 Retinal edema: Secondary | ICD-10-CM

## 2018-06-01 DIAGNOSIS — H25813 Combined forms of age-related cataract, bilateral: Secondary | ICD-10-CM

## 2018-06-01 DIAGNOSIS — I10 Essential (primary) hypertension: Secondary | ICD-10-CM | POA: Diagnosis not present

## 2018-06-03 ENCOUNTER — Encounter (INDEPENDENT_AMBULATORY_CARE_PROVIDER_SITE_OTHER): Payer: Self-pay | Admitting: Ophthalmology

## 2018-06-17 ENCOUNTER — Ambulatory Visit: Payer: Medicare Other | Admitting: Urology

## 2018-06-17 DIAGNOSIS — N5201 Erectile dysfunction due to arterial insufficiency: Secondary | ICD-10-CM | POA: Diagnosis not present

## 2018-09-17 ENCOUNTER — Other Ambulatory Visit: Payer: Self-pay

## 2018-09-17 DIAGNOSIS — I10 Essential (primary) hypertension: Secondary | ICD-10-CM

## 2018-09-17 MED ORDER — LOSARTAN POTASSIUM 50 MG PO TABS: 50.0000 mg | ORAL_TABLET | Freq: Every day | ORAL | 3 refills | Status: DC

## 2018-12-11 ENCOUNTER — Ambulatory Visit: Payer: Medicare Other | Admitting: Family Medicine

## 2018-12-18 ENCOUNTER — Ambulatory Visit (INDEPENDENT_AMBULATORY_CARE_PROVIDER_SITE_OTHER): Payer: Medicare Other | Admitting: Family Medicine

## 2018-12-18 ENCOUNTER — Other Ambulatory Visit: Payer: Self-pay

## 2018-12-18 ENCOUNTER — Encounter: Payer: Self-pay | Admitting: Family Medicine

## 2018-12-18 VITALS — BP 140/70 | HR 66 | Temp 98.7°F | Resp 16 | Ht 76.0 in | Wt 207.0 lb

## 2018-12-18 DIAGNOSIS — I10 Essential (primary) hypertension: Secondary | ICD-10-CM | POA: Diagnosis not present

## 2018-12-18 DIAGNOSIS — R634 Abnormal weight loss: Secondary | ICD-10-CM | POA: Diagnosis not present

## 2018-12-18 DIAGNOSIS — Z125 Encounter for screening for malignant neoplasm of prostate: Secondary | ICD-10-CM | POA: Diagnosis not present

## 2018-12-18 MED ORDER — MIRTAZAPINE 30 MG PO TABS: 30.0000 mg | ORAL_TABLET | Freq: Every day | ORAL | 3 refills | Status: DC

## 2018-12-18 MED ORDER — LOSARTAN POTASSIUM 50 MG PO TABS: 50.0000 mg | ORAL_TABLET | Freq: Every day | ORAL | 3 refills | Status: DC

## 2018-12-18 NOTE — Progress Notes (Signed)
Subjective:    Patient ID: Brandon [name], male    DOB: 07-02-1950, 68 y.o.   MRN: [id number]  HPI Patient is here today requesting something to help him gain weight.  Since we saw him last year, the patient has lost approximately 17 pounds down from 2 24-2 07.  He is not sure when the weight loss began.  He states that nothing is wrong.  He states I am still "a bulldog".  However he has lost his appetite.  He states he just does not have any desire to eat.  He denies any fevers or chills.  He denies any cough or shortness of breath or pleurisy.  He does have a longstanding history of tobacco abuse.  He denies any hemoptysis.  He denies any depression however he states that there has been a lot of "drama" in his personal relationship with his wife.  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  Recently in the last year he was informed that he had a 25 year old son out of wedlock when he was sued for child support.  Obviously this created stress in his marriage.  However he is adamant that he can handle stress.  He denies any depression as a cause of his change in appetite.  He was also diagnosed with syphilis and was treated through the health department within the last year.  He is not sure how long he took the antibiotics.  However he denies any neurologic side effects such as neuropathy or memory loss.  He had a colonoscopy in 2013 which was reportedly normal.  He is overdue for prostate cancer screening but he denies any lower urinary tract symptoms.  He denies any nausea or vomiting.  He denies any melena or hematochezia.  He denies any chest pain.  He denies any night sweats. Past Medical History:  Diagnosis Date  . Hypertension   . Syphilis    Past Surgical History:  Procedure Laterality Date  . BACK SURGERY     4 yrs ago  . COLONOSCOPY  01/22/2012   Procedure: COLONOSCOPY;  Surgeon: Rogene Houston, MD;  Location: [facility];  Service: Endoscopy;  Laterality: N/A;  200    Current Outpatient Medications on File Prior to Visit  Medication Sig Dispense Refill  . amLODipine (NORVASC) 10 MG tablet Take 10 mg by mouth daily.  3   No current facility-administered medications on file prior to visit.    No Known Allergies Social History   Socioeconomic History  . Marital status: Legally Separated    Spouse name: Not on file  . Number of children: 3  . Years of education: 59  . Highest education level: High school graduate  Occupational History  . Occupation: Retired- Haematologist  . Financial resource strain: Not hard at all  . Food insecurity    Worry: Never true    Inability: Never true  . Transportation needs    Medical: No    Non-medical: No  Tobacco Use  . Smoking status: Current Every Day Smoker    Packs/day: 0.50    Years: 45.00    Pack years: 22.50    Types: Cigarettes  . Smokeless tobacco: Never Used  Substance and Sexual Activity  . Alcohol use: No  . Drug use: No  . Sexual activity: Yes    Birth control/protection: None  Lifestyle  . Physical activity    Days per week: 3 days    Minutes per session: 60  min  . Stress: Not at all  Relationships  . Social connections    Talks on phone: More than three times a week    Gets together: More than three times a week    Attends religious service: Never    Active member of club or organization: No    Attends meetings of clubs or organizations: Never    Relationship status: Separated  . Intimate partner violence    Fear of current or ex partner: No    Emotionally abused: No    Physically abused: No    Forced sexual activity: No  Other Topics Concern  . Not on file  Social History Narrative   Grew up in [place], [place]. 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    Married for 17 years. Has 3 children, 11 grandchildren, 4 great grandchildren.   Wife had breast cancer and she died eight years ago. Remarried, wife is younger.    Wears seatbelt.   Eats all food groups.     Drinks 3-4 sodas per day.   Right-handed.      Review of Systems  All other systems reviewed and are negative.      Objective:   Physical Exam Vitals signs reviewed.  Constitutional:      General: He is not in acute distress.    Appearance: Normal appearance. He is normal weight. He is not ill-appearing, toxic-appearing or diaphoretic.  HENT:     Head: Normocephalic and atraumatic.  Cardiovascular:     Rate and Rhythm: Normal rate and regular rhythm.     Pulses: Normal pulses.     Heart sounds: Normal heart sounds. No murmur. No friction rub. No gallop.   Pulmonary:     Effort: Pulmonary effort is normal. No respiratory distress.     Breath sounds: Normal breath sounds. No stridor. No wheezing or rhonchi.  Abdominal:     General: Bowel sounds are normal. There is no distension.     Palpations: Abdomen is soft. There is no mass.     Tenderness: There is no abdominal tenderness. There is no guarding or rebound.     Hernia: No hernia is present.  Musculoskeletal:     Right lower leg: No edema.     Left lower leg: No edema.  Skin:    Coloration: Skin is not jaundiced or pale.     Findings: No bruising, erythema, lesion or rash.  Neurological:     General: No focal deficit present.     Mental Status: He is alert and oriented to person, place, and time.     Cranial Nerves: No cranial nerve deficit.     Sensory: No sensory deficit.     Motor: No weakness.     Coordination: Coordination normal.     Gait: Gait normal.     Deep Tendon Reflexes: Reflexes normal.           Assessment & Plan:  1. Weight loss My suspicion is that the patient's lack of appetite could be due to the underlying stress having recently discovered that he had a 66 year old son out of wedlock and also being treated for syphilis in the last year.  However he is adamant that he is not experiencing depression.  We discussed options as an appetite stimulant and he would like to try Remeron 30 mg p.o.  nightly as an appetite stimulant.  I recommended that he call me in 1 month let me know if this is  helping spark his appetite however I have recommended a basic work-up to evaluate for any evidence of underlying medical issues.  I will check a CBC, CMP, TSH, and a sedimentation rate.  Given his smoking I would also recommend a basic chest x-ray.  Also screen the patient for prostate cancer with a PSA. - CBC with Differential/Platelet - COMPLETE METABOLIC PANEL WITH GFR - TSH - Sedimentation rate - DG Chest 2 View; Future  2. Prostate cancer screening - PSA  3. Essential hypertension I refilled the patient's losartan - losartan (COZAAR) 50 MG tablet; Take 1 tablet (50 mg total) by mouth daily.  Dispense: 90 tablet; Refill: 3

## 2018-12-19 LAB — TSH: TSH: 0.74 mIU/L (ref 0.40–4.50)

## 2018-12-19 LAB — CBC WITH DIFFERENTIAL/PLATELET
Absolute Monocytes: 509 cells/uL (ref 200–950)
Basophils Absolute: 27 cells/uL (ref 0–200)
Basophils Relative: 0.4 %
Eosinophils Absolute: 67 cells/uL (ref 15–500)
Eosinophils Relative: 1 %
HCT: 40.2 % (ref 38.5–50.0)
Hemoglobin: 13.5 g/dL (ref 13.2–17.1)
Lymphs Abs: 3397 cells/uL (ref 850–3900)
MCH: 30.3 pg (ref 27.0–33.0)
MCHC: 33.6 g/dL (ref 32.0–36.0)
MCV: 90.3 fL (ref 80.0–100.0)
MPV: 10.2 fL (ref 7.5–12.5)
Monocytes Relative: 7.6 %
Neutro Abs: 2700 cells/uL (ref 1500–7800)
Neutrophils Relative %: 40.3 %
Platelets: 276 10*3/uL (ref 140–400)
RBC: 4.45 10*6/uL (ref 4.20–5.80)
RDW: 12.4 % (ref 11.0–15.0)
Total Lymphocyte: 50.7 %
WBC: 6.7 10*3/uL (ref 3.8–10.8)

## 2018-12-19 LAB — COMPLETE METABOLIC PANEL WITH GFR
AG Ratio: 1.5 (calc) (ref 1.0–2.5)
ALT: 14 U/L (ref 9–46)
AST: 20 U/L (ref 10–35)
Albumin: 4.3 g/dL (ref 3.6–5.1)
Alkaline phosphatase (APISO): 108 U/L (ref 35–144)
BUN: 14 mg/dL (ref 7–25)
CO2: 28 mmol/L (ref 20–32)
Calcium: 10.7 mg/dL — ABNORMAL HIGH (ref 8.6–10.3)
Chloride: 107 mmol/L (ref 98–110)
Creat: 0.92 mg/dL (ref 0.70–1.25)
GFR, Est African American: 101 mL/min/{1.73_m2} (ref >=60)
GFR, Est Non African American: 87 mL/min/{1.73_m2} (ref >=60)
Globulin: 2.9 g/dL (calc) (ref 1.9–3.7)
Glucose, Bld: 93 mg/dL (ref 65–99)
Potassium: 5.3 mmol/L (ref 3.5–5.3)
Sodium: 142 mmol/L (ref 135–146)
Total Bilirubin: 0.3 mg/dL (ref 0.2–1.2)
Total Protein: 7.2 g/dL (ref 6.1–8.1)

## 2018-12-19 LAB — SEDIMENTATION RATE: Sed Rate: 11 mm/h (ref 0–20)

## 2018-12-19 LAB — PSA: PSA: 2.6 ng/mL (ref <=4.0)

## 2018-12-21 ENCOUNTER — Other Ambulatory Visit: Payer: Self-pay | Admitting: Family Medicine

## 2018-12-22 ENCOUNTER — Ambulatory Visit (HOSPITAL_COMMUNITY)
Admission: RE | Admit: 2018-12-22 | Discharge: 2018-12-22 | Disposition: A | Discharge status: Home / Self Care | Payer: Medicare Other | Source: Ambulatory Visit | Attending: Family Medicine | Admitting: Family Medicine

## 2018-12-22 ENCOUNTER — Other Ambulatory Visit: Payer: Self-pay

## 2018-12-22 DIAGNOSIS — R634 Abnormal weight loss: Secondary | ICD-10-CM | POA: Insufficient documentation

## 2019-02-19 ENCOUNTER — Ambulatory Visit (INDEPENDENT_AMBULATORY_CARE_PROVIDER_SITE_OTHER): Payer: Medicare Other | Admitting: Family Medicine

## 2019-02-19 ENCOUNTER — Encounter: Payer: Self-pay | Admitting: Family Medicine

## 2019-02-19 VITALS — BP 98/54 | HR 72 | Temp 98.9°F | Resp 12 | Ht 76.0 in | Wt 213.0 lb

## 2019-02-19 DIAGNOSIS — M545 Low back pain, unspecified: Secondary | ICD-10-CM

## 2019-02-19 MED ORDER — TIZANIDINE HCL 4 MG PO TABS: 4.0000 mg | ORAL_TABLET | Freq: Four times a day (QID) | ORAL | 0 refills | Status: DC | PRN

## 2019-02-19 NOTE — Progress Notes (Signed)
Subjective:    Patient ID: Brandon [name], male    DOB: November 23, 1950, 68 y.o.   MRN: [id number]  HPI  12/18/18 Patient is here today requesting something to help him gain weight.  Since we saw him last year, the patient has lost approximately 17 pounds down from 2 24-2 07.  He is not sure when the weight loss began.  He states that nothing is wrong.  He states I am still "a bulldog".  However he has lost his appetite.  He states he just does not have any desire to eat.  He denies any fevers or chills.  He denies any cough or shortness of breath or pleurisy.  He does have a longstanding history of tobacco abuse.  He denies any hemoptysis.  He denies any depression however he states that there has been a lot of "drama" in his personal relationship with his wife.  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  Recently in the last year he was informed that he had a 61 year old son out of wedlock when he was sued for child support.  Obviously this created stress in his marriage.  However he is adamant that he can handle stress.  He denies any depression as a cause of his change in appetite.  He was also diagnosed with syphilis and was treated through the health department within the last year.  He is not sure how long he took the antibiotics.  However he denies any neurologic side effects such as neuropathy or memory loss.  He had a colonoscopy in 2013 which was reportedly normal.  He is overdue for prostate cancer screening but he denies any lower urinary tract symptoms.  He denies any nausea or vomiting.  He denies any melena or hematochezia.  He denies any chest pain.  He denies any night sweats.  At that time, my plan was: 1. Weight loss My suspicion is that the patient's lack of appetite could be due to the underlying stress having recently discovered that he had a 23 year old son out of wedlock and also being treated for syphilis in the last year.  However he is adamant that he is not experiencing  depression.  We discussed options as an appetite stimulant and he would like to try Remeron 30 mg p.o. nightly as an appetite stimulant.  I recommended that he call me in 1 month let me know if this is helping spark his appetite however I have recommended a basic work-up to evaluate for any evidence of underlying medical issues.  I will check a CBC, CMP, TSH, and a sedimentation rate.  Given his smoking I would also recommend a basic chest x-ray.  Also screen the patient for prostate cancer with a PSA. - CBC with Differential/Platelet - COMPLETE METABOLIC PANEL WITH GFR - TSH - Sedimentation rate - DG Chest 2 View; Future  2. Prostate cancer screening - PSA  3. Essential hypertension I refilled the patient's losartan - losartan (COZAAR) 50 MG tablet; Take 1 tablet (50 mg total) by mouth daily.  Dispense: 90 tablet; Refill: 3   02/19/19 Approximately 10 days ago, the patient was shoveling mulch in his yard.  He felt a pain in his right lower back.  The pain was mild however that night while lying in bed his back began to tighten up and become inflamed.  He had a difficult time moving.  Even getting out of bed elicited severe pain.  The pain has been centered in his right  lower back roughly at the level of L3-L5.  The pain is located to the right of the spine just above the right gluteus.  He has palpable muscle spasm in that area.  The pain is located lateral to a brown birthmark on the patient's lower back.  He denies any right-sided sciatica.  He denies any leg numbness or leg weakness.  He denies any bowel or bladder incontinence.  He denies any pain or tenderness to palpation over the spinous processes of the lumbar spine.  He denies any dysuria or hematuria.  However palpation elicits tenderness.  Patient has been unable to work for the last week due to the pain.  He is having a difficult time even getting out of bed. Past Medical History:  Diagnosis Date  . Hypertension   . Syphilis    Past  Surgical History:  Procedure Laterality Date  . BACK SURGERY     4 yrs ago  . COLONOSCOPY  01/22/2012   Procedure: COLONOSCOPY;  Surgeon: Rogene Houston, MD;  Location: [facility];  Service: Endoscopy;  Laterality: N/A;  200   Current Outpatient Medications on File Prior to Visit  Medication Sig Dispense Refill  . amLODipine (NORVASC) 10 MG tablet Take 10 mg by mouth daily.  3  . losartan (COZAAR) 50 MG tablet Take 1 tablet (50 mg total) by mouth daily. 90 tablet 3  . mirtazapine (REMERON) 30 MG tablet Take 1 tablet (30 mg total) by mouth at bedtime. 30 tablet 3   No current facility-administered medications on file prior to visit.    No Known Allergies Social History   Socioeconomic History  . Marital status: Legally Separated    Spouse name: Not on file  . Number of children: 3  . Years of education: 78  . Highest education level: High school graduate  Occupational History  . Occupation: Retired- Haematologist  . Financial resource strain: Not hard at all  . Food insecurity    Worry: Never true    Inability: Never true  . Transportation needs    Medical: No    Non-medical: No  Tobacco Use  . Smoking status: Current Every Day Smoker    Packs/day: 0.50    Years: 45.00    Pack years: 22.50    Types: Cigarettes  . Smokeless tobacco: Never Used  Substance and Sexual Activity  . Alcohol use: No  . Drug use: No  . Sexual activity: Yes    Birth control/protection: None  Lifestyle  . Physical activity    Days per week: 3 days    Minutes per session: 60 min  . Stress: Not at all  Relationships  . Social connections    Talks on phone: More than three times a week    Gets together: More than three times a week    Attends religious service: Never    Active member of club or organization: No    Attends meetings of clubs or organizations: Never    Relationship status: Separated  . Intimate partner violence    Fear of current or ex partner: No    Emotionally  abused: No    Physically abused: No    Forced sexual activity: No  Other Topics Concern  . Not on file  Social History Narrative   Grew up in [place], [place]. 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    Married for 17 years. Has 3 children, 11 grandchildren, 4 great grandchildren.  Wife had breast cancer and she died eight years ago. Remarried, wife is younger.    Wears seatbelt.   Eats all food groups.    Drinks 3-4 sodas per day.   Right-handed.      Review of Systems  All other systems reviewed and are negative.      Objective:   Physical Exam Vitals signs reviewed.  Constitutional:      General: He is not in acute distress.    Appearance: Normal appearance. He is normal weight. He is not ill-appearing, toxic-appearing or diaphoretic.  HENT:     Head: Normocephalic and atraumatic.  Cardiovascular:     Rate and Rhythm: Normal rate and regular rhythm.     Pulses: Normal pulses.     Heart sounds: Normal heart sounds. No murmur. No friction rub. No gallop.   Pulmonary:     Effort: Pulmonary effort is normal. No respiratory distress.     Breath sounds: Normal breath sounds. No stridor. No wheezing or rhonchi.  Musculoskeletal:     Lumbar back: He exhibits decreased range of motion, tenderness, pain and spasm. He exhibits no bony tenderness.       Back:     Right lower leg: No edema.     Left lower leg: No edema.  Skin:    Coloration: Skin is not jaundiced or pale.     Findings: No bruising, erythema, lesion or rash.  Neurological:     General: No focal deficit present.     Mental Status: He is alert and oriented to person, place, and time.     Cranial Nerves: No cranial nerve deficit.     Sensory: No sensory deficit.     Motor: No weakness.     Coordination: Coordination normal.     Gait: Gait normal.     Deep Tendon Reflexes: Reflexes normal.           Assessment & Plan:  Acute low back pain without sciatica, unspecified back pain laterality  I  believe the patient strained a muscle in his lower back.  He has been unable to work for the last week.  Therefore I will give the patient a doctor's note.  The pain is starting to improve now however I will give him Zanaflex 4 mg every 6 hours as needed for muscle spasm.  He is cleared to return to work on Monday.  Recheck next week if no better or sooner if worse.  Blood pressure is low.  I recommended discontinuation of amlodipine

## 2019-03-25 ENCOUNTER — Other Ambulatory Visit: Payer: Self-pay

## 2019-03-25 DIAGNOSIS — I10 Essential (primary) hypertension: Secondary | ICD-10-CM

## 2019-03-25 MED ORDER — LOSARTAN POTASSIUM 50 MG PO TABS
50.0000 mg | ORAL_TABLET | Freq: Every day | ORAL | 1 refills | Status: DC
Start: 2019-03-25 — End: 2021-10-23

## 2019-06-02 ENCOUNTER — Encounter (INDEPENDENT_AMBULATORY_CARE_PROVIDER_SITE_OTHER): Payer: Medicare Other | Admitting: Ophthalmology

## 2019-08-12 ENCOUNTER — Ambulatory Visit: Payer: Medicare Other | Attending: Internal Medicine

## 2019-08-12 DIAGNOSIS — Z23 Encounter for immunization: Secondary | ICD-10-CM

## 2019-08-12 NOTE — Progress Notes (Signed)
   Covid-19 Vaccination Clinic  Name:  Brandon [name]    MRN: [id number] DOB: 03/28/1951  08/12/2019  [name] was observed post Covid-19 immunization for 15 minutes without incident. He was provided with Vaccine Information Sheet and instruction to access the V-Safe system.   [name] was instructed to call 911 with any severe reactions post vaccine: Marland Kitchen Difficulty breathing  . Swelling of face and throat  . A fast heartbeat  . A bad rash all over body  . Dizziness and weakness   Immunizations Administered    Name Date Dose VIS Date Route   Pfizer COVID-19 Vaccine 08/12/2019 11:13 AM 0.3 mL 05/07/2019 Intramuscular   Manufacturer: [employer], [employer]   Lot: [id number]   NDC: [address]-9

## 2019-09-07 ENCOUNTER — Ambulatory Visit: Payer: Medicare Other | Attending: Internal Medicine

## 2019-09-07 DIAGNOSIS — Z23 Encounter for immunization: Secondary | ICD-10-CM

## 2019-09-07 NOTE — Progress Notes (Signed)
   Covid-19 Vaccination Clinic  Name:  Brandon [name]    MRN: [id number] DOB: 11/24/1950  09/07/2019  [name] was observed post Covid-19 immunization for 15 minutes without incident. He was provided with Vaccine Information Sheet and instruction to access the V-Safe system.   [name] was instructed to call 911 with any severe reactions post vaccine: Marland Kitchen Difficulty breathing  . Swelling of face and throat  . A fast heartbeat  . A bad rash all over body  . Dizziness and weakness   Immunizations Administered    Name Date Dose VIS Date Route   Pfizer COVID-19 Vaccine 09/07/2019  8:49 AM 0.3 mL 05/07/2019 Intramuscular   Manufacturer: [employer], [employer]   Lot: [id number]   NDC: [address]-2

## 2020-01-31 IMAGING — DX CHEST - 2 VIEW
2 series · 2 of 2 positions shown · non-contrast
Comparison: 12/12/2011

CLINICAL DATA: Weight loss.  Smoker.

EXAM:
CHEST - 2 VIEW

[chest pa]
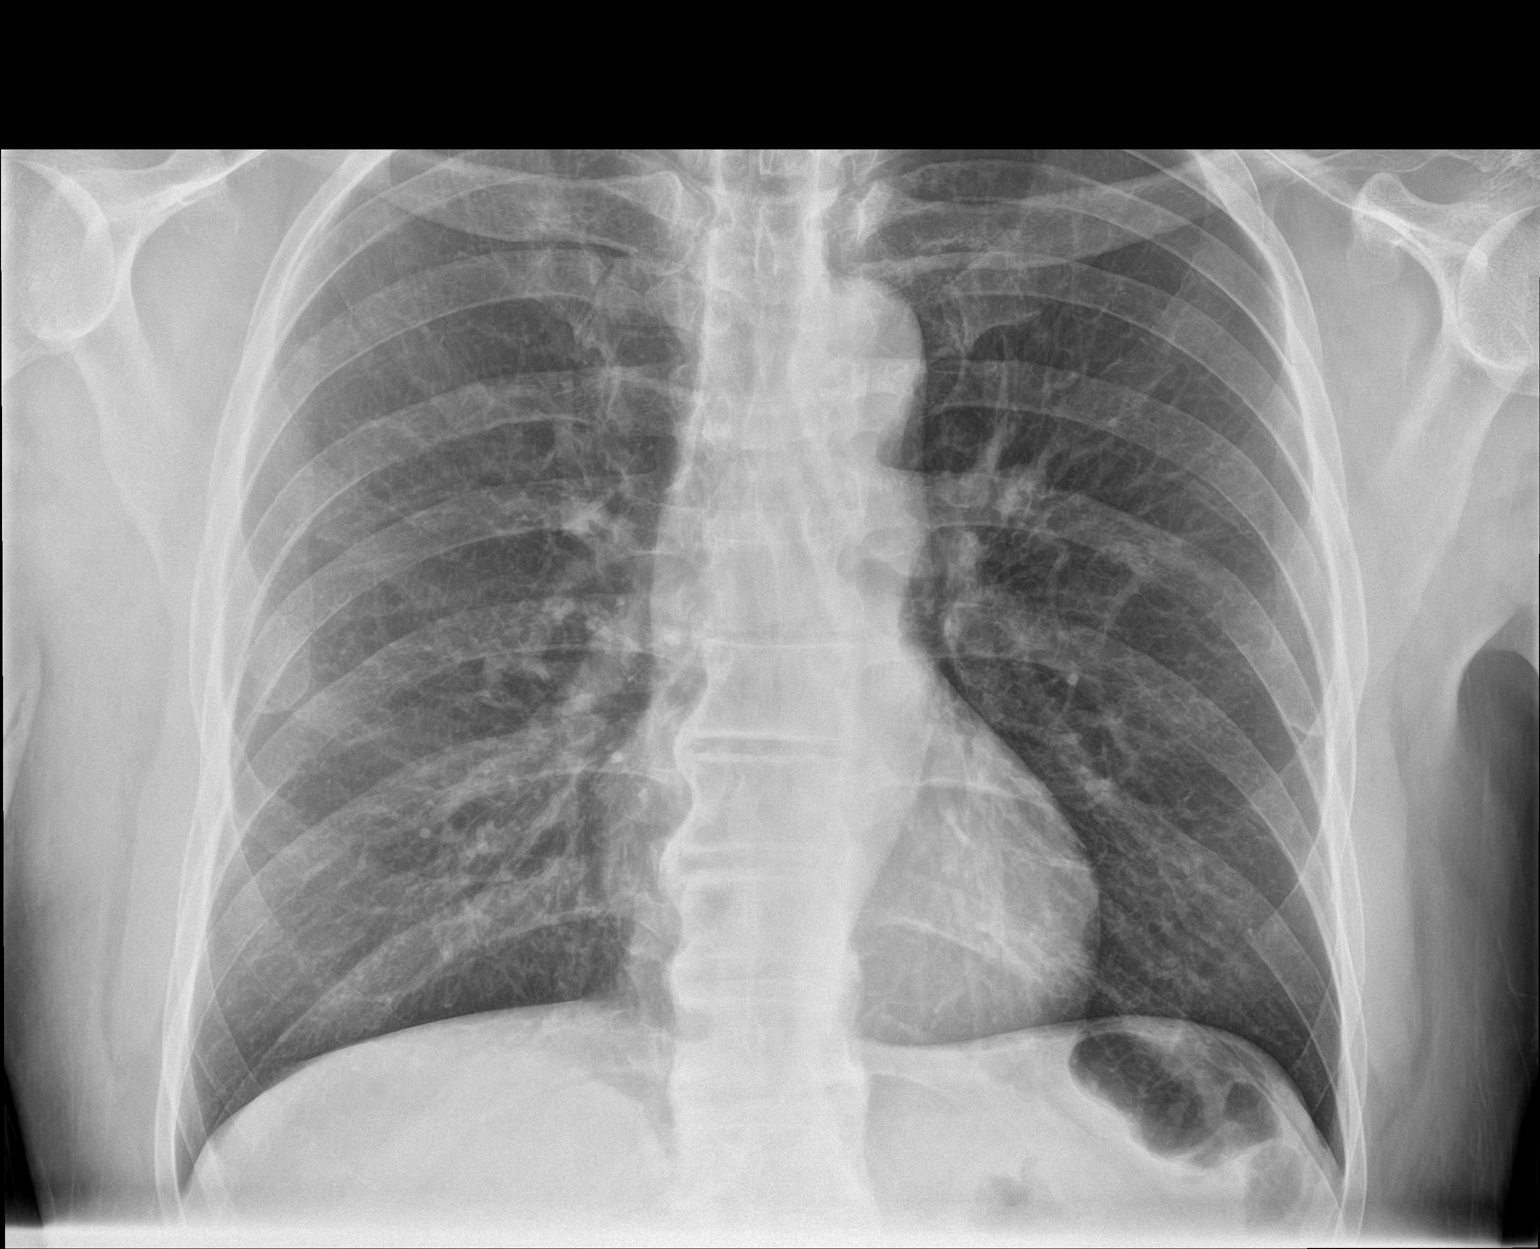

[chest lat]
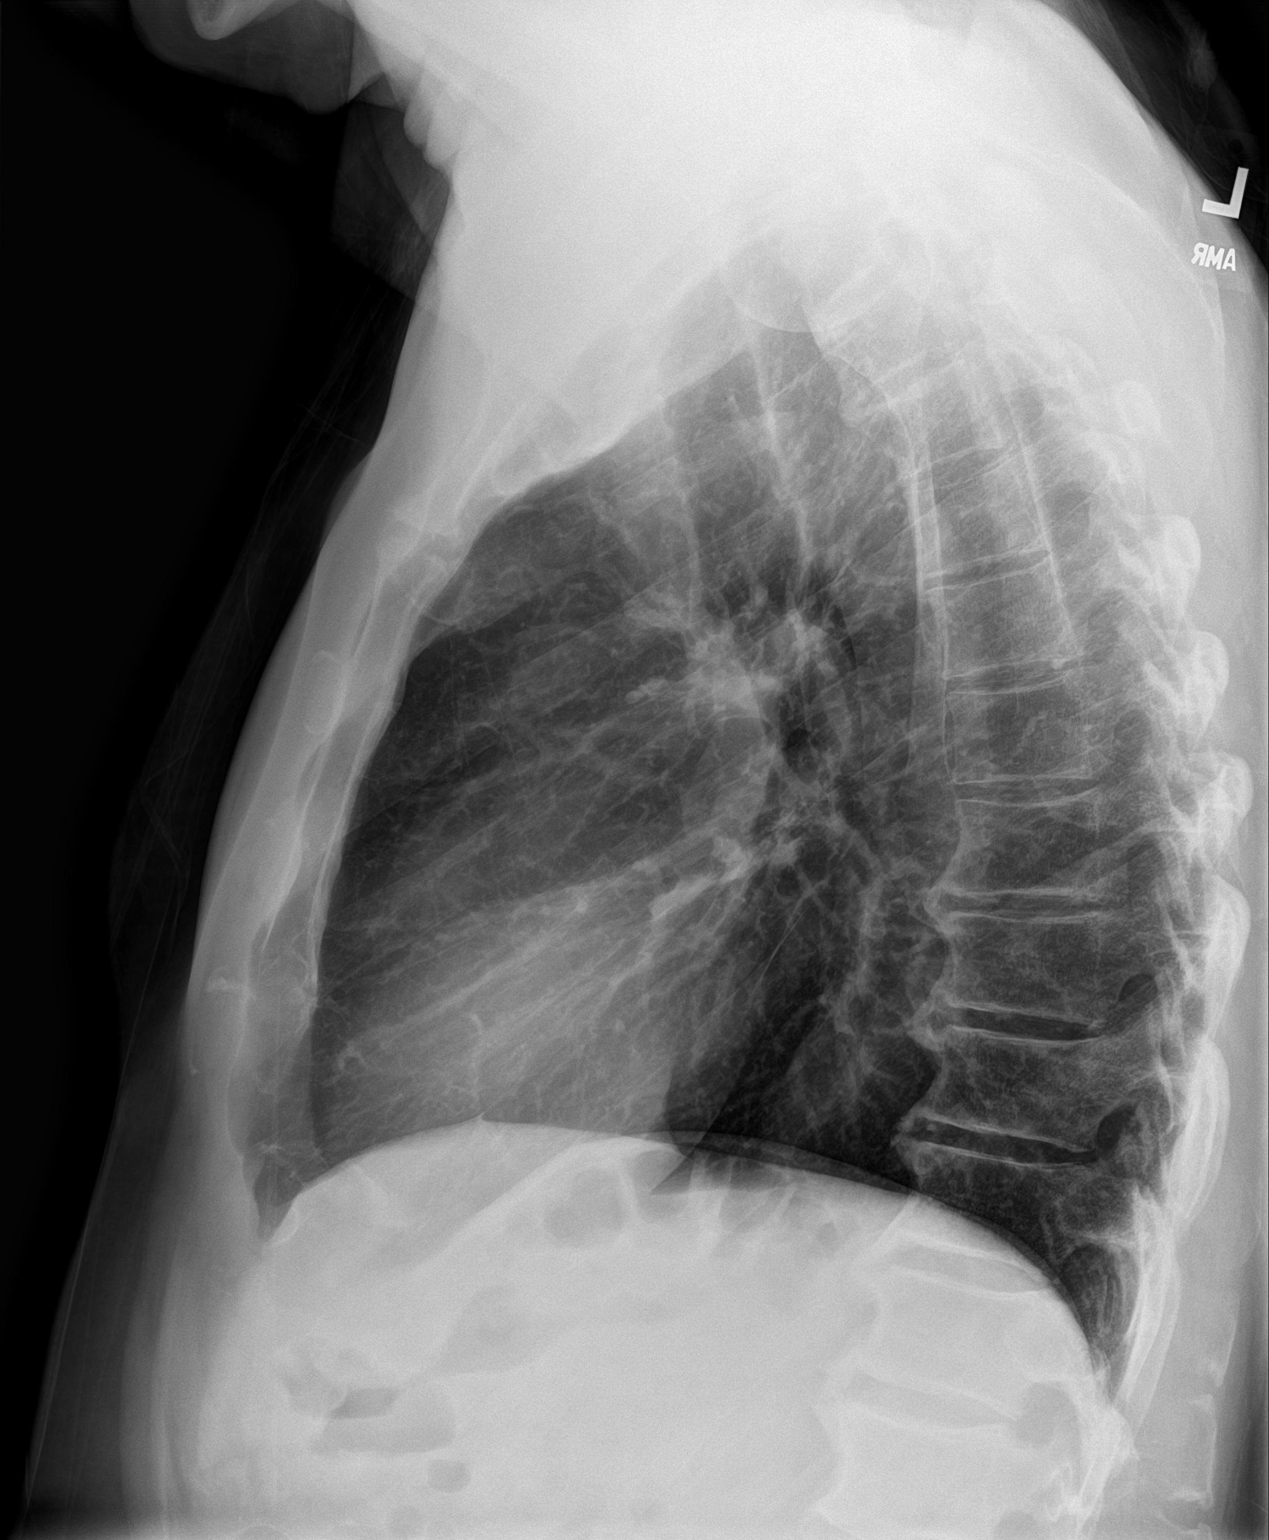

[2 of 2 positions shown; findings below may reference images not displayed]

FINDINGS: The heart size and mediastinal contours are within normal limits.
Both lungs are clear. The visualized skeletal structures are
unremarkable.
IMPRESSION: No active cardiopulmonary disease.

## 2020-03-10 ENCOUNTER — Other Ambulatory Visit: Payer: Self-pay

## 2020-03-10 ENCOUNTER — Ambulatory Visit
Admission: EM | Admit: 2020-03-10 | Discharge: 2020-03-10 | Disposition: A | Discharge status: Home / Self Care | Payer: Medicare HMO | Attending: Emergency Medicine | Admitting: Emergency Medicine

## 2020-03-10 DIAGNOSIS — K047 Periapical abscess without sinus: Secondary | ICD-10-CM

## 2020-03-10 DIAGNOSIS — K0889 Other specified disorders of teeth and supporting structures: Secondary | ICD-10-CM | POA: Diagnosis not present

## 2020-03-10 MED ORDER — MELOXICAM 15 MG PO TABS: 15.0000 mg | ORAL_TABLET | Freq: Every day | ORAL | 0 refills | Status: DC

## 2020-03-10 MED ORDER — AMOXICILLIN-POT CLAVULANATE 875-125 MG PO TABS: 1.0000 | ORAL_TABLET | Freq: Two times a day (BID) | ORAL | 0 refills | Status: AC

## 2020-03-10 MED ORDER — PREDNISONE 20 MG PO TABS
20.0000 mg | ORAL_TABLET | Freq: Two times a day (BID) | ORAL | 0 refills | Status: AC
Start: 2020-03-10 — End: 2020-03-15

## 2020-03-10 MED ORDER — CHLORHEXIDINE GLUCONATE 0.12 % MT SOLN: 15.0000 mL | Freq: Two times a day (BID) | OROMUCOSAL | 0 refills | Status: DC

## 2020-03-10 NOTE — Discharge Instructions (Signed)
Mobic and mouthwash prescribed Prednisone prescribed for swelling  Augmentin for infection Recommend soft diet until evaluated by dentist Maintain oral hygiene care Follow up with dentist as soon as possible for further evaluation and treatment  Return or go to the ED if you have any new or worsening symptoms such as fever, chills, difficulty swallowing, painful swallowing, oral or neck swelling, nausea, vomiting, chest pain, SOB, etc..Marland Kitchen

## 2020-03-10 NOTE — ED Triage Notes (Signed)
Pt presents with left side dental pain and swelling that began yesterday

## 2020-03-10 NOTE — ED Provider Notes (Signed)
[facility]   [id number] 03/10/20 Arrival Time: 1403  CC: DENTAL PAIN  SUBJECTIVE:  Brandon [name] is a 69 y.o. male who presents with dental pain x 1 day.  Denies a precipitating event or trauma.  Localizes pain to upper LT side.  Denies alleviating factors.  Worse with chewing.  Complains of facial swelling.  Denies fever, chills, dysphagia, odynophagia, oral or neck swelling, nausea, vomiting, chest pain, SOB.    ROS: As per HPI.  All other pertinent ROS negative.     Past Medical History:  Diagnosis Date   Hypertension    Syphilis    Past Surgical History:  Procedure Laterality Date   BACK SURGERY     4 yrs ago   COLONOSCOPY  01/22/2012   Procedure: COLONOSCOPY;  Surgeon: Malissa Hippo, MD;  Location: [facility];  Service: Endoscopy;  Laterality: N/A;  200   No Known Allergies No current facility-administered medications on file prior to encounter.   Current Outpatient Medications on File Prior to Encounter  Medication Sig Dispense Refill   losartan (COZAAR) 50 MG tablet Take 1 tablet (50 mg total) by mouth daily. 90 tablet 1   mirtazapine (REMERON) 30 MG tablet Take 1 tablet (30 mg total) by mouth at bedtime. 30 tablet 3   tiZANidine (ZANAFLEX) 4 MG tablet Take 1 tablet (4 mg total) by mouth every 6 (six) hours as needed for muscle spasms. 30 tablet 0   Social History   Socioeconomic History   Marital status: Legally Separated    Spouse name: Not on file   Number of children: 3   Years of education: 12   Highest education level: High school graduate  Occupational History   Occupation: Retired- IT trainer  Tobacco Use   Smoking status: Current Every Day Smoker    Packs/day: 0.50    Years: 45.00    Pack years: 22.50    Types: Cigarettes   Smokeless tobacco: Never Used  Building services engineer Use: Never used  Substance and Sexual Activity   Alcohol use: No   Drug use: No   Sexual activity: Yes    Birth control/protection:  None  Other Topics Concern   Not on file  Social History Narrative   Grew up in [place], [place]. 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    Married for 17 years. Has 3 children, 11 grandchildren, 4 great grandchildren.   Wife had breast cancer and she died eight years ago. Remarried, wife is younger.    Wears seatbelt.   Eats all food groups.    Drinks 3-4 sodas per day.   Right-handed.   Social Determinants of Health   Financial Resource Strain:    Difficulty of Paying Living Expenses: Not on file  Food Insecurity:    Worried About Programme researcher, broadcasting/film/video in the Last Year: Not on file   [employer] of Food in the Last Year: Not on file  Transportation Needs:    Lack of Transportation (Medical): Not on file   Lack of Transportation (Non-Medical): Not on file  Physical Activity:    Days of Exercise per Week: Not on file   Minutes of Exercise per Session: Not on file  Stress:    Feeling of Stress : Not on file  Social Connections:    Frequency of Communication with Friends and Family: Not on file   Frequency of Social Gatherings with Friends and Family: Not on file   Attends  Religious Services: Not on file   Active Member of Clubs or Organizations: Not on file   Attends Club or Organization Meetings: Not on file   Marital Status: Not on file  Intimate Partner Violence:    Fear of Current or Ex-Partner: Not on file   Emotionally Abused: Not on file   Physically Abused: Not on file   Sexually Abused: Not on file   Family History  Problem Relation Age of Onset   Diabetes Mother    Hypertension Mother    Breast cancer Mother    Alcohol abuse Father    Aneurysm Father    Breast cancer Daughter    Colon cancer Neg Hx     OBJECTIVE:  Vitals:   03/10/20 1425  BP: (!) 151/83  Pulse: 62  Resp: 20  Temp: 99.5 F (37.5 C)  SpO2: 98%    General appearance: alert; no distress HENT: normocephalic; atraumatic, LT side cheek swelling; PERRL,EOMI  grossly; nares patent; dentition: fair; dental caries over LT upper gum; no associated erythema or abscess present; oropharynx clear Neck: supple without LAD Lungs: normal respirations; CTAB CV: RRR Skin: warm and dry Psychological: alert and cooperative; normal mood and affect  ASSESSMENT & PLAN:  1. Pain, dental   2. Dental infection     Meds ordered this encounter  Medications   amoxicillin-clavulanate (AUGMENTIN) 875-125 MG tablet    Sig: Take 1 tablet by mouth every 12 (twelve) hours for 10 days.    Dispense:  20 tablet    Refill:  0    Order Specific Question:   Supervising Provider    Answer:   Eustace Moore [[id number]]   meloxicam (MOBIC) 15 MG tablet    Sig: Take 1 tablet (15 mg total) by mouth daily.    Dispense:  30 tablet    Refill:  0    Order Specific Question:   Supervising Provider    Answer:   Eustace Moore [[id number]]   chlorhexidine (PERIDEX) 0.12 % solution    Sig: Use as directed 15 mLs in the mouth or throat 2 (two) times daily.    Dispense:  473 mL    Refill:  0    Order Specific Question:   Supervising Provider    Answer:   Eustace Moore [[id number]]   predniSONE (DELTASONE) 20 MG tablet    Sig: Take 1 tablet (20 mg total) by mouth 2 (two) times daily with a meal for 5 days.    Dispense:  10 tablet    Refill:  0    Order Specific Question:   Supervising Provider    Answer:   Eustace Moore [[id number]]    Mobic and mouthwash prescribed Prednisone prescribed for swelling  Augmentin for infection Recommend soft diet until evaluated by dentist Maintain oral hygiene care Follow up with dentist as soon as possible for further evaluation and treatment  Return or go to the ED if you have any new or worsening symptoms such as fever, chills, difficulty swallowing, painful swallowing, oral or neck swelling, nausea, vomiting, chest pain, SOB, etc...  Reviewed expectations re: course of current medical issues. Questions answered. Outlined  signs and symptoms indicating need for more acute intervention. Patient verbalized understanding. After Visit Summary given.   Rennis Harding, PA-C 03/10/20 1505

## 2020-04-06 ENCOUNTER — Ambulatory Visit (INDEPENDENT_AMBULATORY_CARE_PROVIDER_SITE_OTHER): Payer: Medicare HMO | Admitting: Family Medicine

## 2020-04-06 ENCOUNTER — Other Ambulatory Visit: Payer: Self-pay

## 2020-04-06 VITALS — BP 100/60 | HR 60 | Temp 98.4°F | Ht 76.0 in | Wt 210.0 lb

## 2020-04-06 DIAGNOSIS — K529 Noninfective gastroenteritis and colitis, unspecified: Secondary | ICD-10-CM | POA: Diagnosis not present

## 2020-04-06 DIAGNOSIS — R197 Diarrhea, unspecified: Secondary | ICD-10-CM | POA: Diagnosis not present

## 2020-04-06 DIAGNOSIS — Z0189 Encounter for other specified special examinations: Secondary | ICD-10-CM | POA: Diagnosis not present

## 2020-04-06 NOTE — Progress Notes (Signed)
Subjective:    Patient ID: Brandon [name], male    DOB: [birth year], 69 y.o.   MRN: [id number]  HPI Patient is a 69 year old African-American gentleman who presents today with diarrhea.  I have not seen the patient since September 2020.  He states that shortly after he saw me last year he developed diarrhea.  He states that he has had diarrhea now for more than a year.  It is constant every day.  He states that he goes to the bathroom and his stool comes out with the same consistency as urine.  He states that he goes multiple times a day.  Often the stool is full of blood.  He states that the commode will look like Kool-Aid.  He denies any fevers or chills but he does report fatigue.  He is lost 3 pounds since I saw him last.  He denies any rectal pain.  He denies any pain with defecation.  He denies any nausea or vomiting.  He denies any cough. Past Medical History:  Diagnosis Date  . Hypertension   . Syphilis    Past Surgical History:  Procedure Laterality Date  . BACK SURGERY     4 yrs ago  . COLONOSCOPY  01/22/2012   Procedure: COLONOSCOPY;  Surgeon: Malissa Hippo, MD;  Location: [facility];  Service: Endoscopy;  Laterality: N/A;  200   Current Outpatient Medications on File Prior to Visit  Medication Sig Dispense Refill  . losartan (COZAAR) 50 MG tablet Take 1 tablet (50 mg total) by mouth daily. 90 tablet 1  . chlorhexidine (PERIDEX) 0.12 % solution Use as directed 15 mLs in the mouth or throat 2 (two) times daily. (Patient not taking: Reported on 04/06/2020) 473 mL 0  . meloxicam (MOBIC) 15 MG tablet Take 1 tablet (15 mg total) by mouth daily. (Patient not taking: Reported on 04/06/2020) 30 tablet 0  . mirtazapine (REMERON) 30 MG tablet Take 1 tablet (30 mg total) by mouth at bedtime. (Patient not taking: Reported on 04/06/2020) 30 tablet 3  . tiZANidine (ZANAFLEX) 4 MG tablet Take 1 tablet (4 mg total) by mouth every 6 (six) hours as needed for muscle spasms. (Patient not  taking: Reported on 04/06/2020) 30 tablet 0   No current facility-administered medications on file prior to visit.   No Known Allergies Social History   Socioeconomic History  . Marital status: Legally Separated    Spouse name: Not on file  . Number of children: 3  . Years of education: 34  . Highest education level: High school graduate  Occupational History  . Occupation: RetiredSurveyor, minerals  Tobacco Use  . Smoking status: Current Every Day Smoker    Packs/day: 0.50    Years: 45.00    Pack years: 22.50    Types: Cigarettes  . Smokeless tobacco: Never Used  Vaping Use  . Vaping Use: Never used  Substance and Sexual Activity  . Alcohol use: No  . Drug use: No  . Sexual activity: Yes    Birth control/protection: None  Other Topics Concern  . Not on file  Social History Narrative   Grew up in [place], [place]. 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    Married for 17 years. Has 3 children, 11 grandchildren, 4 great grandchildren.   Wife had breast cancer and she died eight years ago. Remarried, wife is younger.    Wears seatbelt.   Eats all food groups.    Drinks 3-4  sodas per day.   Right-handed.   Social Determinants of Health   Financial Resource Strain:   . Difficulty of Paying Living Expenses: Not on file  Food Insecurity:   . Worried About Programme researcher, broadcasting/film/video in the Last Year: Not on file  . Ran Out of Food in the Last Year: Not on file  Transportation Needs:   . Lack of Transportation (Medical): Not on file  . Lack of Transportation (Non-Medical): Not on file  Physical Activity:   . Days of Exercise per Week: Not on file  . Minutes of Exercise per Session: Not on file  Stress:   . Feeling of Stress : Not on file  Social Connections:   . Frequency of Communication with Friends and Family: Not on file  . Frequency of Social Gatherings with Friends and Family: Not on file  . Attends Religious Services: Not on file  . Active Member of Clubs or Organizations:  Not on file  . Attends Banker Meetings: Not on file  . Marital Status: Not on file  Intimate Partner Violence:   . Fear of Current or Ex-Partner: Not on file  . Emotionally Abused: Not on file  . Physically Abused: Not on file  . Sexually Abused: Not on file      Review of Systems  Gastrointestinal: Positive for diarrhea.  All other systems reviewed and are negative.      Objective:   Physical Exam Vitals reviewed.  Constitutional:      General: He is not in acute distress.    Appearance: Normal appearance. He is normal weight. He is not ill-appearing, toxic-appearing or diaphoretic.  HENT:     Head: Normocephalic and atraumatic.  Cardiovascular:     Rate and Rhythm: Normal rate and regular rhythm.     Pulses: Normal pulses.     Heart sounds: Normal heart sounds. No murmur heard.  No friction rub. No gallop.   Pulmonary:     Effort: Pulmonary effort is normal. No respiratory distress.     Breath sounds: Normal breath sounds. No stridor. No wheezing or rhonchi.  Abdominal:     General: Bowel sounds are normal. There is no distension.     Palpations: Abdomen is soft. There is no mass.     Tenderness: There is no abdominal tenderness. There is no guarding or rebound.     Hernia: No hernia is present.  Musculoskeletal:     Right lower leg: No edema.     Left lower leg: No edema.  Skin:    Coloration: Skin is not jaundiced or pale.     Findings: No bruising, erythema, lesion or rash.  Neurological:     General: No focal deficit present.     Mental Status: He is alert and oriented to person, place, and time.     Cranial Nerves: No cranial nerve deficit.     Sensory: No sensory deficit.     Motor: No weakness.     Coordination: Coordination normal.     Gait: Gait normal.     Deep Tendon Reflexes: Reflexes normal.           Assessment & Plan:  Chronic diarrhea - Plan: CBC with Differential/Platelet, COMPLETE METABOLIC PANEL WITH GFR, Sedimentation  rate, Celiac panel 10, Celiac Disease Panel  Bloody diarrhea  Per the patient's history this is been going on for more than a year.  Therefore I am concerned about inflammatory bowel disease such as Crohn's disease or  ulcerative colitis.  I believe an infection would be unlikely.  I will check a sedimentation rate to evaluate for evidence of an autoimmune disease.  I will check a celiac panel.  Given the frequency of the bloody stool I will also check a CBC to evaluate for severe anemia and check a CMP to evaluate for renal failure.  If lab work is stable, I will schedule the patient for an urgent GI consultation for colonoscopy to evaluate for inflammatory bowel disease and also to rule out malignancy.

## 2020-04-07 LAB — CBC WITH DIFFERENTIAL/PLATELET
Absolute Monocytes: 541 cells/uL (ref 200–950)
Basophils Absolute: 31 cells/uL (ref 0–200)
Basophils Relative: 0.7 %
Eosinophils Absolute: 70 cells/uL (ref 15–500)
Eosinophils Relative: 1.6 %
HCT: 39.4 % (ref 38.5–50.0)
Hemoglobin: 13.4 g/dL (ref 13.2–17.1)
Lymphs Abs: 2350 cells/uL (ref 850–3900)
MCH: 30.1 pg (ref 27.0–33.0)
MCHC: 34 g/dL (ref 32.0–36.0)
MCV: 88.5 fL (ref 80.0–100.0)
MPV: 10.9 fL (ref 7.5–12.5)
Monocytes Relative: 12.3 %
Neutro Abs: 1408 cells/uL — ABNORMAL LOW (ref 1500–7800)
Neutrophils Relative %: 32 %
Platelets: 300 10*3/uL (ref 140–400)
RBC: 4.45 10*6/uL (ref 4.20–5.80)
RDW: 12.2 % (ref 11.0–15.0)
Total Lymphocyte: 53.4 %
WBC: 4.4 10*3/uL (ref 3.8–10.8)

## 2020-04-07 LAB — COMPLETE METABOLIC PANEL WITH GFR
AG Ratio: 1.5 (calc) (ref 1.0–2.5)
ALT: 10 U/L (ref 9–46)
AST: 22 U/L (ref 10–35)
Albumin: 4.2 g/dL (ref 3.6–5.1)
Alkaline phosphatase (APISO): 110 U/L (ref 35–144)
BUN: 13 mg/dL (ref 7–25)
CO2: 27 mmol/L (ref 20–32)
Calcium: 9.9 mg/dL (ref 8.6–10.3)
Chloride: 107 mmol/L (ref 98–110)
Creat: 0.95 mg/dL (ref 0.70–1.25)
GFR, Est African American: 96 mL/min/{1.73_m2} (ref >=60)
GFR, Est Non African American: 82 mL/min/{1.73_m2} (ref >=60)
Globulin: 2.8 g/dL (calc) (ref 1.9–3.7)
Glucose, Bld: 88 mg/dL (ref 65–99)
Potassium: 4.4 mmol/L (ref 3.5–5.3)
Sodium: 142 mmol/L (ref 135–146)
Total Bilirubin: 0.4 mg/dL (ref 0.2–1.2)
Total Protein: 7 g/dL (ref 6.1–8.1)

## 2020-04-07 LAB — CELIAC DISEASE PANEL
(tTG) Ab, IgA: <1 U/mL
(tTG) Ab, IgG: <1 U/mL
Gliadin IgA: 6 U/mL
Gliadin IgG: <1 U/mL
Immunoglobulin A: 432 mg/dL — ABNORMAL HIGH (ref 70–320)

## 2020-04-07 LAB — SEDIMENTATION RATE: Sed Rate: 17 mm/h (ref 0–20)

## 2020-11-10 ENCOUNTER — Telehealth: Payer: Self-pay | Admitting: Family Medicine

## 2020-11-10 NOTE — Telephone Encounter (Signed)
Left message for patient to call back and schedule Medicare Annual Wellness Visit (AWV) in office.   If not able to come in office, please offer to do virtually or by telephone.   No history of  AWV: Per [employer] eligible for AWVI as of  11/24/2012  Please schedule at anytime with [facility]-Nurse Health Advisor.  If any questions, please contact me at [phone]

## 2021-02-09 DIAGNOSIS — R030 Elevated blood-pressure reading, without diagnosis of hypertension: Secondary | ICD-10-CM | POA: Diagnosis not present

## 2021-02-09 DIAGNOSIS — Z720 Tobacco use: Secondary | ICD-10-CM | POA: Diagnosis not present

## 2021-09-10 DIAGNOSIS — Z8249 Family history of ischemic heart disease and other diseases of the circulatory system: Secondary | ICD-10-CM | POA: Diagnosis not present

## 2021-09-10 DIAGNOSIS — Z803 Family history of malignant neoplasm of breast: Secondary | ICD-10-CM | POA: Diagnosis not present

## 2021-09-10 DIAGNOSIS — Z720 Tobacco use: Secondary | ICD-10-CM | POA: Diagnosis not present

## 2021-09-10 DIAGNOSIS — I951 Orthostatic hypotension: Secondary | ICD-10-CM | POA: Diagnosis not present

## 2021-09-10 DIAGNOSIS — Z833 Family history of diabetes mellitus: Secondary | ICD-10-CM | POA: Diagnosis not present

## 2021-09-10 DIAGNOSIS — I739 Peripheral vascular disease, unspecified: Secondary | ICD-10-CM | POA: Diagnosis not present

## 2021-09-10 DIAGNOSIS — R030 Elevated blood-pressure reading, without diagnosis of hypertension: Secondary | ICD-10-CM | POA: Diagnosis not present

## 2021-09-10 DIAGNOSIS — Z008 Encounter for other general examination: Secondary | ICD-10-CM | POA: Diagnosis not present

## 2021-10-23 ENCOUNTER — Ambulatory Visit (INDEPENDENT_AMBULATORY_CARE_PROVIDER_SITE_OTHER): Payer: Medicare HMO | Admitting: Family Medicine

## 2021-10-23 DIAGNOSIS — I10 Essential (primary) hypertension: Secondary | ICD-10-CM | POA: Diagnosis not present

## 2021-10-23 MED ORDER — LOSARTAN POTASSIUM 50 MG PO TABS: 50.0000 mg | ORAL_TABLET | Freq: Every day | ORAL | 1 refills | Status: DC

## 2021-10-23 MED ORDER — LEVOCETIRIZINE DIHYDROCHLORIDE 5 MG PO TABS
5.0000 mg | ORAL_TABLET | Freq: Every evening | ORAL | 0 refills | Status: DC
Start: 2021-10-23 — End: 2022-08-30

## 2021-10-23 NOTE — Progress Notes (Signed)
Subjective:    Patient ID: Brandon [name], male    DOB: 10-05-1950, 71 y.o.   MRN: [id number]  Patient has not been checking his blood pressure.  He has not been taking losartan.  Blood pressure today is elevated at 140/101.  He denies any chest pain or shortness of breath.  However he presented today for cough.  He states he had a cough for a week.  He denies any fevers or chills.  He denies any pleurisy.  He denies any purulent sputum.  He denies any shortness of breath or hemoptysis or wheezing.  The cough began after he started cutting trees around his house.  There is been a lot of pollen around his house due to all of the tree work being done.  He reports coughing and sneezing. Past Medical History:  Diagnosis Date   Hypertension    Syphilis    Past Surgical History:  Procedure Laterality Date   BACK SURGERY     4 yrs ago   COLONOSCOPY  01/22/2012   Procedure: COLONOSCOPY;  Surgeon: Malissa Hippo, MD;  Location: [facility];  Service: Endoscopy;  Laterality: N/A;  200   Current Outpatient Medications on File Prior to Visit  Medication Sig Dispense Refill   chlorhexidine (PERIDEX) 0.12 % solution Use as directed 15 mLs in the mouth or throat 2 (two) times daily. (Patient not taking: Reported on 10/23/2021) 473 mL 0   meloxicam (MOBIC) 15 MG tablet Take 1 tablet (15 mg total) by mouth daily. (Patient not taking: Reported on 10/23/2021) 30 tablet 0   mirtazapine (REMERON) 30 MG tablet Take 1 tablet (30 mg total) by mouth at bedtime. (Patient not taking: Reported on 10/23/2021) 30 tablet 3   tiZANidine (ZANAFLEX) 4 MG tablet Take 1 tablet (4 mg total) by mouth every 6 (six) hours as needed for muscle spasms. (Patient not taking: Reported on 10/23/2021) 30 tablet 0   No current facility-administered medications on file prior to visit.   No Known Allergies Social History   Socioeconomic History   Marital status: Legally Separated    Spouse name: Not on file   Number of children:  3   Years of education: 12   Highest education level: High school graduate  Occupational History   Occupation: Retired- IT trainer  Tobacco Use   Smoking status: Every Day    Packs/day: 0.50    Years: 45.00    Pack years: 22.50    Types: Cigarettes   Smokeless tobacco: Never  Vaping Use   Vaping Use: Never used  Substance and Sexual Activity   Alcohol use: No   Drug use: No   Sexual activity: Yes    Birth control/protection: None  Other Topics Concern   Not on file  Social History Narrative   Grew up in [place], [place]. 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    Married for 17 years. Has 3 children, 11 grandchildren, 4 great grandchildren.   Wife had breast cancer and she died eight years ago. Remarried, wife is younger.    Wears seatbelt.   Eats all food groups.    Drinks 3-4 sodas per day.   Right-handed.   Social Determinants of Health   Financial Resource Strain: Not on file  Food Insecurity: Not on file  Transportation Needs: Not on file  Physical Activity: Not on file  Stress: Not on file  Social Connections: Not on file  Intimate Partner Violence: Not on file  Review of Systems  All other systems reviewed and are negative.     Objective:   Physical Exam Vitals reviewed.  Constitutional:      General: He is not in acute distress.    Appearance: Normal appearance. He is normal weight. He is not ill-appearing, toxic-appearing or diaphoretic.  HENT:     Head: Normocephalic and atraumatic.  Cardiovascular:     Rate and Rhythm: Normal rate and regular rhythm.     Pulses: Normal pulses.     Heart sounds: Normal heart sounds. No murmur heard.   No friction rub. No gallop.  Pulmonary:     Effort: Pulmonary effort is normal. No respiratory distress.     Breath sounds: Normal breath sounds. No stridor. No wheezing or rhonchi.  Abdominal:     General: Bowel sounds are normal. There is no distension.     Palpations: Abdomen is soft. There is no mass.      Tenderness: There is no abdominal tenderness. There is no guarding or rebound.     Hernia: No hernia is present.  Musculoskeletal:     Right lower leg: No edema.     Left lower leg: No edema.  Skin:    Coloration: Skin is not jaundiced or pale.     Findings: No bruising, erythema, lesion or rash.  Neurological:     General: No focal deficit present.     Mental Status: He is alert and oriented to person, place, and time.     Cranial Nerves: No cranial nerve deficit.     Sensory: No sensory deficit.     Motor: No weakness.     Coordination: Coordination normal.     Gait: Gait normal.     Deep Tendon Reflexes: Reflexes normal.          Assessment & Plan:  Essential hypertension - Plan: losartan (COZAAR) 50 MG tablet, DISCONTINUED: losartan (COZAAR) 50 MG tablet I suspect the patient's cough is due to postnasal drip from a virus or perhaps due to allergies.  I recommended trying Xyzal to treat postnasal drip.  Recheck in 1 week if no better or sooner if worse.  Resume losartan 50 mg a day for hypertension recheck blood pressure in 1 month.

## 2021-11-23 ENCOUNTER — Ambulatory Visit (INDEPENDENT_AMBULATORY_CARE_PROVIDER_SITE_OTHER): Payer: Medicare HMO

## 2021-11-23 VITALS — Ht 76.0 in | Wt 222.0 lb

## 2021-11-23 DIAGNOSIS — Z0000 Encounter for general adult medical examination without abnormal findings: Secondary | ICD-10-CM

## 2021-11-23 NOTE — Progress Notes (Signed)
Subjective:   Brandon [name] is a 71 y.o. male who presents for an Initial Medicare Annual Wellness Visit. Virtual Visit via Telephone Note  I connected with  Brandon [name] on 11/23/21 at  2:45 PM EDT by telephone and verified that I am speaking with the correct person using two identifiers.  Location: Patient: HOME Provider: [facility] Persons participating in the virtual visit: patient/Nurse Health Advisor   I discussed the limitations, risks, security and privacy concerns of performing an evaluation and management service by telephone and the availability of in person appointments. The patient expressed understanding and agreed to proceed.  Interactive audio and video telecommunications were attempted between this nurse and patient, however failed, due to patient having technical difficulties OR patient did not have access to video capability.  We continued and completed visit with audio only.  Some vital signs may be absent or patient reported.   Darral Dash, LPN  Review of Systems     Cardiac Risk Factors include: advanced age (>6men, >110 women);hypertension;male gender;smoking/ tobacco exposure;sedentary lifestyle     Objective:    Today's Vitals   11/23/21 1450  Weight: 222 lb (100.7 kg)  Height: 6\' 4"  (1.93 m)   Body mass index is 27.02 kg/m.     11/23/2021    2:53 PM 01/22/2012    1:05 PM  Advanced Directives  Does Patient Have a Medical Advance Directive? Yes Patient has advance directive, copy not in chart  Type of Advance Directive Healthcare Power of [place];Living will Living will;Healthcare Power of Attorney  Copy of Healthcare Power of Attorney in Chart? No - copy requested   Pre-existing out of facility DNR order (yellow form or pink MOST form)  No    Current Medications (verified) Outpatient Encounter Medications as of 11/23/2021  Medication Sig   chlorhexidine (PERIDEX) 0.12 % solution Use as directed 15 mLs in the mouth or throat 2  (two) times daily.   levocetirizine (XYZAL ALLERGY 24HR) 5 MG tablet Take 1 tablet (5 mg total) by mouth every evening.   losartan (COZAAR) 50 MG tablet Take 1 tablet (50 mg total) by mouth daily.   meloxicam (MOBIC) 15 MG tablet Take 1 tablet (15 mg total) by mouth daily.   mirtazapine (REMERON) 30 MG tablet Take 1 tablet (30 mg total) by mouth at bedtime.   tiZANidine (ZANAFLEX) 4 MG tablet Take 1 tablet (4 mg total) by mouth every 6 (six) hours as needed for muscle spasms.   No facility-administered encounter medications on file as of 11/23/2021.    Allergies (verified) Patient has no known allergies.   History: Past Medical History:  Diagnosis Date   Hypertension    Syphilis    Past Surgical History:  Procedure Laterality Date   BACK SURGERY     4 yrs ago   COLONOSCOPY  01/22/2012   Procedure: COLONOSCOPY;  Surgeon: 01/24/2012, MD;  Location: [facility];  Service: Endoscopy;  Laterality: N/A;  200   Family History  Problem Relation Age of Onset   Diabetes Mother    Hypertension Mother    Breast cancer Mother    Alcohol abuse Father    Aneurysm Father    Breast cancer Daughter    Colon cancer Neg Hx    Social History   Socioeconomic History   Marital status: Legally Separated    Spouse name: Not on file   Number of children: 3   Years of education: 12   Highest education  level: High school graduate  Occupational History   Occupation: Retired- IT trainer  Tobacco Use   Smoking status: Every Day    Packs/day: 0.50    Years: 45.00    Total pack years: 22.50    Types: Cigarettes   Smokeless tobacco: Never  Vaping Use   Vaping Use: Never used  Substance and Sexual Activity   Alcohol use: No   Drug use: No   Sexual activity: Yes    Birth control/protection: None  Other Topics Concern   Not on file  Social History Narrative   Grew up in [place], [place]. 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    Married for 17 years. Has 3 children, 11 grandchildren, 4  great grandchildren.   Wife had breast cancer and she died eight years ago. Remarried, wife is younger.    Wears seatbelt.   Eats all food groups.    Drinks 3-4 sodas per day.   Right-handed.   Social Determinants of Health   Financial Resource Strain: Low Risk  (04/08/2018)   Overall Financial Resource Strain (CARDIA)    Difficulty of Paying Living Expenses: Not hard at all  Food Insecurity: No Food Insecurity (11/23/2021)   Hunger Vital Sign    Worried About Running Out of Food in the Last Year: Never true    Ran Out of Food in the Last Year: Never true  Transportation Needs: No Transportation Needs (11/23/2021)   PRAPARE - Administrator, Civil Service (Medical): No    Lack of Transportation (Non-Medical): No  Physical Activity: Insufficiently Active (11/23/2021)   Exercise Vital Sign    Days of Exercise per Week: 2 days    Minutes of Exercise per Session: 60 min  Stress: No Stress Concern Present (11/23/2021)   [employer] of Occupational Health - Occupational Stress Questionnaire    Feeling of Stress : Not at all  Social Connections: Socially Integrated (11/23/2021)   Social Connection and Isolation Panel [NHANES]    Frequency of Communication with Friends and Family: More than three times a week    Frequency of Social Gatherings with Friends and Family: More than three times a week    Attends Religious Services: More than 4 times per year    Active Member of [employer] or Organizations: Yes    Attends Engineer, structural: More than 4 times per year    Marital Status: Married    Tobacco Counseling Ready to quit: Not Answered Counseling given: Not Answered   Clinical Intake:  Pre-visit preparation completed: Yes  Pain : No/denies pain     BMI - recorded: 27.02 Nutritional Status: BMI 25 -29 Overweight Nutritional Risks: None Diabetes: No  How often do you need to have someone help you when you read instructions, pamphlets, or other written  materials from your doctor or pharmacy?: 1 - Never  Diabetic?no  Interpreter Needed?: No  Information entered by :: mj Batoul Limes, lpn   Activities of Daily Living    11/23/2021    2:54 PM  In your present state of health, do you have any difficulty performing the following activities:  Hearing? 0  Vision? 0  Difficulty concentrating or making decisions? 0  Walking or climbing stairs? 0  Dressing or bathing? 0  Doing errands, shopping? 0  Preparing Food and eating ? N  Using the Toilet? N  In the past six months, have you accidently leaked urine? N  Do you have problems with loss of bowel  control? N  Managing your Medications? N  Managing your Finances? N  Housekeeping or managing your Housekeeping? N    Patient Care Team: Donita Brooks, MD as PCP - General (Family Medicine) Rennis Chris, MD as Consulting Physician (Ophthalmology)  Indicate any recent Medical Services you may have received from other than [facility] providers in the past year (date may be approximate).     Assessment:   This is a routine wellness examination for [place].  Hearing/Vision screen Hearing Screening - Comments:: No hearing issues.  Vision Screening - Comments:: Glasses. [facility] in [place]. 2023.  Dietary issues and exercise activities discussed: Current Exercise Habits: Home exercise routine, Type of exercise: walking, Time (Minutes): 60, Frequency (Times/Week): 2, Weekly Exercise (Minutes/Week): 120, Intensity: Mild, Exercise limited by: cardiac condition(s)   Goals Addressed             This Visit's Progress    Exercise 3x per week (30 min per time)       Continue to stay active and healthy.       Depression Screen    11/23/2021    2:52 PM 04/08/2018    2:51 PM 01/27/2018    1:38 PM 01/13/2018    1:13 PM 09/05/2017   11:32 AM 08/14/2017    2:22 PM 08/14/2017    1:46 PM  PHQ 2/9 Scores  PHQ - 2 Score 0 0 0 3 0 3 6  PHQ- 9 Score   2 6 6 9 12     Fall Risk    11/23/2021     2:53 PM 04/08/2018    2:51 PM 01/27/2018    1:38 PM 01/13/2018    1:13 PM 08/14/2017    1:46 PM  Fall Risk   Falls in the past year? 0 0 No No No  Number falls in past yr: 0      Injury with Fall? 0      Risk for fall due to : No Fall Risks      Follow up Falls prevention discussed Falls evaluation completed       FALL RISK PREVENTION PERTAINING TO THE HOME:  Any stairs in or around the home? Yes  If so, are there any without handrails? No  Home free of loose throw rugs in walkways, pet beds, electrical cords, etc? Yes  Adequate lighting in your home to reduce risk of falls? Yes   ASSISTIVE DEVICES UTILIZED TO PREVENT FALLS:  Life alert? No  Use of a cane, walker or w/c? No  Grab bars in the bathroom? No  Shower chair or bench in shower? No  Elevated toilet seat or a handicapped toilet? No   TIMED UP AND GO:  Was the test performed? No .  Phone visit.  Cognitive Function:        11/23/2021    2:54 PM  6CIT Screen  What Year? 0 points  What month? 0 points  What time? 0 points  Count back from 20 0 points  Months in reverse 2 points  Repeat phrase 0 points  Total Score 2 points    Immunizations Immunization History  Administered Date(s) Administered   Influenza,inj,Quad PF,6+ Mos 01/13/2018   PFIZER(Purple Top)SARS-COV-2 Vaccination 08/12/2019, 09/07/2019   Tdap 09/05/2017    TDAP status: Up to date  Flu Vaccine status: Due, Education has been provided regarding the importance of this vaccine. Advised may receive this vaccine at local pharmacy or Health Dept. Aware to provide a copy of the vaccination record if  obtained from local pharmacy or Health Dept. Verbalized acceptance and understanding.  Pneumococcal vaccine status: Due, Education has been provided regarding the importance of this vaccine. Advised may receive this vaccine at local pharmacy or Health Dept. Aware to provide a copy of the vaccination record if obtained from local pharmacy or Health  Dept. Verbalized acceptance and understanding.  Covid-19 vaccine status: Completed vaccines  Qualifies for Shingles Vaccine? Yes   Zostavax completed No   Shingrix Completed?: No.    Education has been provided regarding the importance of this vaccine. Patient has been advised to call insurance company to determine out of pocket expense if they have not yet received this vaccine. Advised may also receive vaccine at local pharmacy or Health Dept. Verbalized acceptance and understanding.  Screening Tests Health Maintenance  Topic Date Due   Pneumonia Vaccine 80+ Years old (1 - PCV) Never done   Zoster Vaccines- Shingrix (1 of 2) Never done   COVID-19 Vaccine (3 - Pfizer series) 11/02/2019   INFLUENZA VACCINE  12/25/2021   COLONOSCOPY (Pts 45-36yrs Insurance coverage will need to be confirmed)  01/21/2022   TETANUS/TDAP  09/06/2027   Hepatitis C Screening  Completed   HPV VACCINES  Aged Out    Health Maintenance  Health Maintenance Due  Topic Date Due   Pneumonia Vaccine 75+ Years old (1 - PCV) Never done   Zoster Vaccines- Shingrix (1 of 2) Never done   COVID-19 Vaccine (3 - Pfizer series) 11/02/2019    Colorectal cancer screening: Type of screening: Colonoscopy. Completed 01/19/2012. Repeat every 10 years  Lung Cancer Screening: (Low Dose CT Chest recommended if Age 33-80 years, 30 pack-year currently smoking OR have quit w/in 15years.) does qualify.   Lung Cancer Screening Referral: Declined at this time.  Additional Screening:  Hepatitis C Screening: does qualify; Completed 08/14/2017.  Vision Screening: Recommended annual ophthalmology exams for early detection of glaucoma and other disorders of the eye. Is the patient up to date with their annual eye exam?  Yes  Who is the provider or what is the name of the office in which the patient attends annual eye exams? [facility] If pt is not established with a provider, would they like to be referred to a provider to establish  care? No .   Dental Screening: Recommended annual dental exams for proper oral hygiene  Community Resource Referral / Chronic Care Management: CRR required this visit?  No   CCM required this visit?  No      Plan:     I have personally reviewed and noted the following in the patient's chart:   Medical and social history Use of alcohol, tobacco or illicit drugs  Current medications and supplements including opioid prescriptions. Patient is not currently taking opioid prescriptions. Functional ability and status Nutritional status Physical activity Advanced directives List of other physicians Hospitalizations, surgeries, and ER visits in previous 12 months Vitals Screenings to include cognitive, depression, and falls Referrals and appointments  In addition, I have reviewed and discussed with patient certain preventive protocols, quality metrics, and best practice recommendations. A written personalized care plan for preventive services as well as general preventive health recommendations were provided to patient.     Darral Dash, LPN   1/63/8466   Nurse Notes: Discussed Colonoscopy and need for repeat in August 2023. Discussed Shingrix and how to obtain.

## 2021-11-23 NOTE — Patient Instructions (Signed)
Brandon [name] , Thank you for taking time to come for your Medicare Wellness Visit. I appreciate your ongoing commitment to your health goals. Please review the following plan we discussed and let me know if I can assist you in the future.   Screening recommendations/referrals: Colonoscopy: Done 01/22/2012 Repeat in 10 years  Recommended yearly ophthalmology/optometry visit for glaucoma screening and checkup Recommended yearly dental visit for hygiene and checkup  Vaccinations: Influenza vaccine: Due Fall 2023. Pneumococcal vaccine: Due after age 59. Tdap vaccine: Done 09/05/2017 Repeat in 10 years  Shingles vaccine: Discussed. Available at your local pharmacy.   Covid-19: Done 08/12/2019 and 09/07/2019.  Advanced directives: Please bring a copy of your health care power of attorney and living will to the office to be added to your chart at your convenience.   Conditions/risks identified: Aim for 30 minutes of exercise or brisk walking, 6-8 glasses of water, and 5 servings of fruits and vegetables each day.    Next appointment: Follow up in one year for your annual wellness visit. 2024.  Preventive Care 9 Years and Older, Male  Preventive care refers to lifestyle choices and visits with your health care provider that can promote health and wellness. What does preventive care include? A yearly physical exam. This is also called an annual well check. Dental exams once or twice a year. Routine eye exams. Ask your health care provider how often you should have your eyes checked. Personal lifestyle choices, including: Daily care of your teeth and gums. Regular physical activity. Eating a healthy diet. Avoiding tobacco and drug use. Limiting alcohol use. Practicing safe sex. Taking low doses of aspirin every day. Taking vitamin and mineral supplements as recommended by your health care provider. What happens during an annual well check? The services and screenings done by your  health care provider during your annual well check will depend on your age, overall health, lifestyle risk factors, and family history of disease. Counseling  Your health care provider may ask you questions about your: Alcohol use. Tobacco use. Drug use. Emotional well-being. Home and relationship well-being. Sexual activity. Eating habits. History of falls. Memory and ability to understand (cognition). Work and work Astronomer. Screening  You may have the following tests or measurements: Height, weight, and BMI. Blood pressure. Lipid and cholesterol levels. These may be checked every 5 years, or more frequently if you are over 27 years old. Skin check. Lung cancer screening. You may have this screening every year starting at age 60 if you have a 30-pack-year history of smoking and currently smoke or have quit within the past 15 years. Fecal occult blood test (FOBT) of the stool. You may have this test every year starting at age 39. Flexible sigmoidoscopy or colonoscopy. You may have a sigmoidoscopy every 5 years or a colonoscopy every 10 years starting at age 23. Prostate cancer screening. Recommendations will vary depending on your family history and other risks. Hepatitis C blood test. Hepatitis B blood test. Sexually transmitted disease (STD) testing. Diabetes screening. This is done by checking your blood sugar (glucose) after you have not eaten for a while (fasting). You may have this done every 1-3 years. Abdominal aortic aneurysm (AAA) screening. You may need this if you are a current or former smoker. Osteoporosis. You may be screened starting at age 38 if you are at high risk. Talk with your health care provider about your test results, treatment options, and if necessary, the need for more tests. Vaccines  Your health care  provider may recommend certain vaccines, such as: Influenza vaccine. This is recommended every year. Tetanus, diphtheria, and acellular pertussis  (Tdap, Td) vaccine. You may need a Td booster every 10 years. Zoster vaccine. You may need this after age 64. Pneumococcal 13-valent conjugate (PCV13) vaccine. One dose is recommended after age 47. Pneumococcal polysaccharide (PPSV23) vaccine. One dose is recommended after age 15. Talk to your health care provider about which screenings and vaccines you need and how often you need them. This information is not intended to replace advice given to you by your health care provider. Make sure you discuss any questions you have with your health care provider. Document Released: 06/09/2015 Document Revised: 01/31/2016 Document Reviewed: 03/14/2015 Elsevier Interactive Patient Education  2017 [place] Prevention in the Home Falls can cause injuries. They can happen to people of all ages. There are many things you can do to make your home safe and to help prevent falls. What can I do on the outside of my home? Regularly fix the edges of walkways and driveways and fix any cracks. Remove anything that might make you trip as you walk through a door, such as a raised step or threshold. Trim any bushes or trees on the path to your home. Use bright outdoor lighting. Clear any walking paths of anything that might make someone trip, such as rocks or tools. Regularly check to see if handrails are loose or broken. Make sure that both sides of any steps have handrails. Any raised decks and porches should have guardrails on the edges. Have any leaves, snow, or ice cleared regularly. Use sand or salt on walking paths during winter. Clean up any spills in your garage right away. This includes oil or grease spills. What can I do in the bathroom? Use night lights. Install grab bars by the toilet and in the tub and shower. Do not use towel bars as grab bars. Use non-skid mats or decals in the tub or shower. If you need to sit down in the shower, use a plastic, non-slip stool. Keep the floor dry. Clean  up any water that spills on the floor as soon as it happens. Remove soap buildup in the tub or shower regularly. Attach bath mats securely with double-sided non-slip rug tape. Do not have throw rugs and other things on the floor that can make you trip. What can I do in the bedroom? Use night lights. Make sure that you have a light by your bed that is easy to reach. Do not use any sheets or blankets that are too big for your bed. They should not hang down onto the floor. Have a firm chair that has side arms. You can use this for support while you get dressed. Do not have throw rugs and other things on the floor that can make you trip. What can I do in the kitchen? Clean up any spills right away. Avoid walking on wet floors. Keep items that you use a lot in easy-to-reach places. If you need to reach something above you, use a strong step stool that has a grab bar. Keep electrical cords out of the way. Do not use floor polish or wax that makes floors slippery. If you must use wax, use non-skid floor wax. Do not have throw rugs and other things on the floor that can make you trip. What can I do with my stairs? Do not leave any items on the stairs. Make sure that there are handrails on both  sides of the stairs and use them. Fix handrails that are broken or loose. Make sure that handrails are as long as the stairways. Check any carpeting to make sure that it is firmly attached to the stairs. Fix any carpet that is loose or worn. Avoid having throw rugs at the top or bottom of the stairs. If you do have throw rugs, attach them to the floor with carpet tape. Make sure that you have a light switch at the top of the stairs and the bottom of the stairs. If you do not have them, ask someone to add them for you. What else can I do to help prevent falls? Wear shoes that: Do not have high heels. Have rubber bottoms. Are comfortable and fit you well. Are closed at the toe. Do not wear sandals. If you  use a stepladder: Make sure that it is fully opened. Do not climb a closed stepladder. Make sure that both sides of the stepladder are locked into place. Ask someone to hold it for you, if possible. Clearly mark and make sure that you can see: Any grab bars or handrails. First and last steps. Where the edge of each step is. Use tools that help you move around (mobility aids) if they are needed. These include: Canes. Walkers. Scooters. Crutches. Turn on the lights when you go into a dark area. Replace any light bulbs as soon as they burn out. Set up your furniture so you have a clear path. Avoid moving your furniture around. If any of your floors are uneven, fix them. If there are any pets around you, be aware of where they are. Review your medicines with your doctor. Some medicines can make you feel dizzy. This can increase your chance of falling. Ask your doctor what other things that you can do to help prevent falls. This information is not intended to replace advice given to you by your health care provider. Make sure you discuss any questions you have with your health care provider. Document Released: 03/09/2009 Document Revised: 10/19/2015 Document Reviewed: 06/17/2014 Elsevier Interactive Patient Education  2017 [employer].

## 2022-01-08 ENCOUNTER — Ambulatory Visit (INDEPENDENT_AMBULATORY_CARE_PROVIDER_SITE_OTHER): Payer: Medicare HMO | Admitting: Family Medicine

## 2022-01-08 VITALS — BP 126/78 | HR 68 | Ht 76.0 in | Wt 220.4 lb

## 2022-01-08 DIAGNOSIS — I10 Essential (primary) hypertension: Secondary | ICD-10-CM

## 2022-01-08 DIAGNOSIS — Z125 Encounter for screening for malignant neoplasm of prostate: Secondary | ICD-10-CM | POA: Diagnosis not present

## 2022-01-08 DIAGNOSIS — G2581 Restless legs syndrome: Secondary | ICD-10-CM | POA: Diagnosis not present

## 2022-01-08 MED ORDER — PRAMIPEXOLE DIHYDROCHLORIDE 0.25 MG PO TABS: 0.2500 mg | ORAL_TABLET | Freq: Every day | ORAL | 1 refills | Status: DC

## 2022-01-08 NOTE — Progress Notes (Signed)
Subjective:    Patient ID: Brandon [name], male    DOB: Jan 15, 1951, 71 y.o.   MRN: [id number]  Patient has a history of hypertension.  His blood pressure today is well controlled at 126/78.  However he has noticed recently that he has developed some swelling in his ankles.  The swelling is trace at best.  He denies any chest pain.  He denies any dyspnea on exertion.  He denies any orthopnea.  He denies any paroxysmal nocturnal dyspnea.  He denies any oliguria.  There is no evidence of jaundice.  He is also reporting restless leg symptoms.  He would like a medication to help with restless leg syndrome.  He is unable to recall the medicine that he took in the past.  He believes it may have been Mirapex.  He reports an uneasy feeling in his legs at night when he tries to sleep.  He reports feeling heaviness in the pain that improves with movement however this interferes with his sleep. Past Medical History:  Diagnosis Date   Hypertension    Syphilis    Past Surgical History:  Procedure Laterality Date   BACK SURGERY     4 yrs ago   COLONOSCOPY  01/22/2012   Procedure: COLONOSCOPY;  Surgeon: Malissa Hippo, MD;  [facility];  Service: Endoscopy;  Laterality: N/A;  200   Current Outpatient Medications on File Prior to Visit  Medication Sig Dispense Refill   losartan (COZAAR) 50 MG tablet Take 1 tablet (50 mg total) by mouth daily. 90 tablet 1   chlorhexidine (PERIDEX) 0.12 % solution Use as directed 15 mLs in the mouth or throat 2 (two) times daily. (Patient not taking: Reported on 01/08/2022) 473 mL 0   levocetirizine (XYZAL ALLERGY 24HR) 5 MG tablet Take 1 tablet (5 mg total) by mouth every evening. (Patient not taking: Reported on 01/08/2022) 30 tablet 0   meloxicam (MOBIC) 15 MG tablet Take 1 tablet (15 mg total) by mouth daily. (Patient not taking: Reported on 01/08/2022) 30 tablet 0   mirtazapine (REMERON) 30 MG tablet Take 1 tablet (30 mg total) by mouth at bedtime. (Patient  not taking: Reported on 01/08/2022) 30 tablet 3   tiZANidine (ZANAFLEX) 4 MG tablet Take 1 tablet (4 mg total) by mouth every 6 (six) hours as needed for muscle spasms. (Patient not taking: Reported on 01/08/2022) 30 tablet 0   No current facility-administered medications on file prior to visit.   No Known Allergies Social History   Socioeconomic History   Marital status: Legally Separated    Spouse name: Not on file   Number of children: 3   Years of education: 12   Highest education level: High school graduate  Occupational History   Occupation: Retired- IT trainer  Tobacco Use   Smoking status: Every Day    Packs/day: 0.50    Years: 45.00    Total pack years: 22.50    Types: Cigarettes   Smokeless tobacco: Never  Vaping Use   Vaping Use: Never used  Substance and Sexual Activity   Alcohol use: No   Drug use: No   Sexual activity: Yes    Birth control/protection: None  Other Topics Concern   Not on file  Social History Narrative   Grew up in [place], [place]. 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    Married for 17 years. Has 3 children, 11 grandchildren, 4 great grandchildren.   Wife had breast cancer and  she died eight years ago. Remarried, wife is younger.    Wears seatbelt.   Eats all food groups.    Drinks 3-4 sodas per day.   Right-handed.   Social Determinants of Health   Financial Resource Strain: Low Risk  (04/08/2018)   Overall Financial Resource Strain (CARDIA)    Difficulty of Paying Living Expenses: Not hard at all  Food Insecurity: No Food Insecurity (11/23/2021)   Hunger Vital Sign    Worried About Running Out of Food in the Last Year: Never true    Ran Out of Food in the Last Year: Never true  Transportation Needs: No Transportation Needs (11/23/2021)   PRAPARE - Administrator, Civil Service (Medical): No    Lack of Transportation (Non-Medical): No  Physical Activity: Insufficiently Active (11/23/2021)   Exercise Vital Sign    Days of  Exercise per Week: 2 days    Minutes of Exercise per Session: 60 min  Stress: No Stress Concern Present (11/23/2021)   [employer] of Occupational Health - Occupational Stress Questionnaire    Feeling of Stress : Not at all  Social Connections: Socially Integrated (11/23/2021)   Social Connection and Isolation Panel [NHANES]    Frequency of Communication with Friends and Family: More than three times a week    Frequency of Social Gatherings with Friends and Family: More than three times a week    Attends Religious Services: More than 4 times per year    Active Member of [employer] or Organizations: Yes    Attends Engineer, structural: More than 4 times per year    Marital Status: Married  Catering manager Violence: Not At Risk (11/23/2021)   Humiliation, Afraid, Rape, and Kick questionnaire    Fear of Current or Ex-Partner: No    Emotionally Abused: No    Physically Abused: No    Sexually Abused: No      Review of Systems  Gastrointestinal:  Positive for diarrhea.  All other systems reviewed and are negative.      Objective:   Physical Exam Vitals reviewed.  Constitutional:      General: He is not in acute distress.    Appearance: Normal appearance. He is normal weight. He is not ill-appearing, toxic-appearing or diaphoretic.  HENT:     Head: Normocephalic and atraumatic.  Cardiovascular:     Rate and Rhythm: Normal rate and regular rhythm.     Pulses: Normal pulses.     Heart sounds: Normal heart sounds. No murmur heard.    No friction rub. No gallop.  Pulmonary:     Effort: Pulmonary effort is normal. No respiratory distress.     Breath sounds: Normal breath sounds. No stridor. No wheezing or rhonchi.  Abdominal:     General: Bowel sounds are normal. There is no distension.     Palpations: Abdomen is soft. There is no mass.     Tenderness: There is no abdominal tenderness. There is no guarding or rebound.     Hernia: No hernia is present.  Musculoskeletal:      Right lower leg: No edema.     Left lower leg: No edema.  Skin:    Coloration: Skin is not jaundiced or pale.     Findings: No bruising, erythema, lesion or rash.  Neurological:     General: No focal deficit present.     Mental Status: He is alert and oriented to person, place, and time.     Cranial Nerves: No  cranial nerve deficit.     Sensory: No sensory deficit.     Motor: No weakness.     Coordination: Coordination normal.     Gait: Gait normal.     Deep Tendon Reflexes: Reflexes normal.           Assessment & Plan:  Benign essential HTN - Plan: COMPLETE METABOLIC PANEL WITH GFR, CBC with Differential/Platelet  Prostate cancer screening - Plan: PSA  RLS (restless legs syndrome) Very happy with his blood pressure.  I will check a CMP and a CBC given the swelling in his ankles.  I do not believe that the patient is dealing with congestive heart failure however I will not rule out any renal dysfunction or liver dysfunction.  I believe this most likely venous insufficiency.  At the present time swelling is minimal so he does not require treatment for it.  I will screen for prostate cancer with a PSA along with checking lab work.  The patient has restless leg syndrome so I will start him on Mirapex 0.25 mg p.o. nightly and we will see how this works.

## 2022-01-09 LAB — COMPLETE METABOLIC PANEL WITH GFR
AG Ratio: 1.6 (calc) (ref 1.0–2.5)
ALT: 12 U/L (ref 9–46)
AST: 19 U/L (ref 10–35)
Albumin: 4.2 g/dL (ref 3.6–5.1)
Alkaline phosphatase (APISO): 105 U/L (ref 35–144)
BUN: 19 mg/dL (ref 7–25)
CO2: 27 mmol/L (ref 20–32)
Calcium: 9.6 mg/dL (ref 8.6–10.3)
Chloride: 110 mmol/L (ref 98–110)
Creat: 1.16 mg/dL (ref 0.70–1.35)
Globulin: 2.6 g/dL (calc) (ref 1.9–3.7)
Glucose, Bld: 77 mg/dL (ref 65–99)
Potassium: 4.2 mmol/L (ref 3.5–5.3)
Sodium: 145 mmol/L (ref 135–146)
Total Bilirubin: 0.3 mg/dL (ref 0.2–1.2)
Total Protein: 6.8 g/dL (ref 6.1–8.1)
eGFR: 69 mL/min/{1.73_m2} (ref >=60)

## 2022-01-09 LAB — CBC WITH DIFFERENTIAL/PLATELET
Absolute Monocytes: 637 cells/uL (ref 200–950)
Basophils Absolute: 21 cells/uL (ref 0–200)
Basophils Relative: 0.3 %
Eosinophils Absolute: 91 cells/uL (ref 15–500)
Eosinophils Relative: 1.3 %
HCT: 37.5 % — ABNORMAL LOW (ref 38.5–50.0)
Hemoglobin: 12.7 g/dL — ABNORMAL LOW (ref 13.2–17.1)
Lymphs Abs: 3080 cells/uL (ref 850–3900)
MCH: 30.3 pg (ref 27.0–33.0)
MCHC: 33.9 g/dL (ref 32.0–36.0)
MCV: 89.5 fL (ref 80.0–100.0)
MPV: 9.5 fL (ref 7.5–12.5)
Monocytes Relative: 9.1 %
Neutro Abs: 3171 cells/uL (ref 1500–7800)
Neutrophils Relative %: 45.3 %
Platelets: 281 10*3/uL (ref 140–400)
RBC: 4.19 10*6/uL — ABNORMAL LOW (ref 4.20–5.80)
RDW: 12.6 % (ref 11.0–15.0)
Total Lymphocyte: 44 %
WBC: 7 10*3/uL (ref 3.8–10.8)

## 2022-01-09 LAB — PSA: PSA: 2.39 ng/mL (ref <=4.00)

## 2022-01-14 ENCOUNTER — Telehealth: Payer: Self-pay

## 2022-01-14 NOTE — Telephone Encounter (Signed)
Pt called stating he needs the Rx sent in to "help keep his legs from falling asleep" sent into [facility]. I think he is referencing the Mirtazapine. Thank you.

## 2022-02-07 DIAGNOSIS — H1013 Acute atopic conjunctivitis, bilateral: Secondary | ICD-10-CM | POA: Diagnosis not present

## 2022-02-07 DIAGNOSIS — H25813 Combined forms of age-related cataract, bilateral: Secondary | ICD-10-CM | POA: Diagnosis not present

## 2022-06-07 ENCOUNTER — Other Ambulatory Visit: Payer: Self-pay | Admitting: Family Medicine

## 2022-06-07 DIAGNOSIS — I10 Essential (primary) hypertension: Secondary | ICD-10-CM

## 2022-06-07 NOTE — Telephone Encounter (Signed)
Requested Prescriptions  Pending Prescriptions Disp Refills   losartan (COZAAR) 50 MG tablet [Pharmacy Med Name: Losartan Potassium 50 MG Oral Tablet] 90 tablet 0    Sig: Take 1 tablet by mouth once daily     Cardiovascular:  Angiotensin Receptor Blockers Failed - 06/07/2022  1:15 PM      Failed - Valid encounter within last 6 months    Recent Outpatient Visits           7 months ago Essential hypertension   [place], Brandon Mcgee, MD   2 years ago Chronic diarrhea   [place] Brandon Frizzle, MD   3 years ago Acute low back pain without sciatica, unspecified back pain laterality   [place] Brandon Frizzle, MD   3 years ago Weight loss   [place] Brandon Frizzle, MD   4 years ago Essential hypertension   [place] Delsa Grana, PA-C              Passed - Cr in normal range and within 180 days    Creat  Date Value Ref Range Status  01/08/2022 1.16 0.70 - 1.35 mg/dL Final         Passed - K in normal range and within 180 days    Potassium  Date Value Ref Range Status  01/08/2022 4.2 3.5 - 5.3 mmol/L Final         Passed - Patient is not pregnant      Passed - Last BP in normal range    BP Readings from Last 1 Encounters:  01/08/22 126/78

## 2022-06-10 ENCOUNTER — Other Ambulatory Visit: Payer: Self-pay | Admitting: Family Medicine

## 2022-06-10 DIAGNOSIS — I10 Essential (primary) hypertension: Secondary | ICD-10-CM

## 2022-07-30 ENCOUNTER — Telehealth: Payer: Self-pay | Admitting: Family Medicine

## 2022-07-30 NOTE — Telephone Encounter (Signed)
Left message to return call to reschedule upcoming Medicare AWV; need to remove it from [facility] schedule and add it to NP's schedule.

## 2022-08-22 DIAGNOSIS — Z8249 Family history of ischemic heart disease and other diseases of the circulatory system: Secondary | ICD-10-CM | POA: Diagnosis not present

## 2022-08-22 DIAGNOSIS — Z008 Encounter for other general examination: Secondary | ICD-10-CM | POA: Diagnosis not present

## 2022-08-22 DIAGNOSIS — I10 Essential (primary) hypertension: Secondary | ICD-10-CM | POA: Diagnosis not present

## 2022-08-22 DIAGNOSIS — Z720 Tobacco use: Secondary | ICD-10-CM | POA: Diagnosis not present

## 2022-08-30 ENCOUNTER — Encounter: Payer: Self-pay | Admitting: Family Medicine

## 2022-08-30 ENCOUNTER — Ambulatory Visit (INDEPENDENT_AMBULATORY_CARE_PROVIDER_SITE_OTHER): Payer: Medicare HMO | Admitting: Family Medicine

## 2022-08-30 VITALS — BP 128/72 | HR 67 | Temp 98.3°F | Ht 76.0 in | Wt 222.6 lb

## 2022-08-30 DIAGNOSIS — K625 Hemorrhage of anus and rectum: Secondary | ICD-10-CM | POA: Diagnosis not present

## 2022-08-30 DIAGNOSIS — R5383 Other fatigue: Secondary | ICD-10-CM

## 2022-08-30 NOTE — Progress Notes (Signed)
Subjective:    Patient ID: Brandon [name], male    DOB: 04-13-1951, 72 y.o.   MRN: [id number]  Patient has a history of hypertension.  His blood pressure today is well controlled at 128/72.  However the patient reports severe fatigue.  He states that he has no energy.  Since I last saw him, he has become the worst.  He admits that he is under stress.  His daughter suggested he get a B12 shot.  He has no history of B12 deficiency.  Recently "he has seen bright red blood per rectum.  He denies any pain with defecation.  Rectal exam was performed today and revealed no rectal mass.  Prostate is normal.  There is no blood on my exam finger.  He is overdue for a colonoscopy. Past Medical History:  Diagnosis Date   Hypertension    Syphilis    Past Surgical History:  Procedure Laterality Date   BACK SURGERY     4 yrs ago   COLONOSCOPY  01/22/2012   Procedure: COLONOSCOPY;  Surgeon: Malissa Hippo, MD;  [facility];  Service: Endoscopy;  Laterality: N/A;  200   Current Outpatient Medications on File Prior to Visit  Medication Sig Dispense Refill   losartan (COZAAR) 50 MG tablet Take 1 tablet by mouth once daily 90 tablet 0   pramipexole (MIRAPEX) 0.25 MG tablet Take 1 tablet (0.25 mg total) by mouth at bedtime. 30 tablet 1   No current facility-administered medications on file prior to visit.   No Known Allergies Social History   Socioeconomic History   Marital status: Legally Separated    Spouse name: Not on file   Number of children: 3   Years of education: 12   Highest education level: High school graduate  Occupational History   Occupation: Retired- IT trainer  Tobacco Use   Smoking status: Every Day    Packs/day: 0.50    Years: 45.00    Additional pack years: 0.00    Total pack years: 22.50    Types: Cigarettes   Smokeless tobacco: Never  Vaping Use   Vaping Use: Never used  Substance and Sexual Activity   Alcohol use: No   Drug use: No   Sexual activity:  Yes    Birth control/protection: None  Other Topics Concern   Not on file  Social History Narrative   Grew up in [place], [place]. 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    Married for 17 years. Has 3 children, 11 grandchildren, 4 great grandchildren.   Wife had breast cancer and she died eight years ago. Remarried, wife is younger.    Wears seatbelt.   Eats all food groups.    Drinks 3-4 sodas per day.   Right-handed.   Social Determinants of Health   Financial Resource Strain: Low Risk  (04/08/2018)   Overall Financial Resource Strain (CARDIA)    Difficulty of Paying Living Expenses: Not hard at all  Food Insecurity: No Food Insecurity (11/23/2021)   Hunger Vital Sign    Worried About Running Out of Food in the Last Year: Never true    Ran Out of Food in the Last Year: Never true  Transportation Needs: No Transportation Needs (11/23/2021)   PRAPARE - Administrator, Civil Service (Medical): No    Lack of Transportation (Non-Medical): No  Physical Activity: Insufficiently Active (11/23/2021)   Exercise Vital Sign    Days of Exercise per Week: 2  days    Minutes of Exercise per Session: 60 min  Stress: No Stress Concern Present (11/23/2021)   [employer]ational [employer] - Occupational Stress Questionnaire    Feeling of Stress : Not at all  Social Connections: Socially Integrated (11/23/2021)   Social Connection and Isolation Panel [NHANES]    Frequency of Communication with Friends and Family: More than three times a week    Frequency of Social Gatherings with Friends and Family: More than three times a week    Attends Religious Services: More than 4 times per year    Active Member of [employer] or Organizations: Yes    Attends Engineer, structuralClub or Organization Meetings: More than 4 times per year    Marital Status: Married  Catering managerntimate Partner Violence: Not At Risk (11/23/2021)   Humiliation, Afraid, Rape, and Kick questionnaire    Fear of Current or Ex-Partner: No     Emotionally Abused: No    Physically Abused: No    Sexually Abused: No      Review of Systems  Gastrointestinal:  Positive for diarrhea.  All other systems reviewed and are negative.      Objective:   Physical Exam Vitals reviewed.  Constitutional:      General: He is not in acute distress.    Appearance: Normal appearance. He is normal weight. He is not ill-appearing, toxic-appearing or diaphoretic.  HENT:     Head: Normocephalic and atraumatic.  Cardiovascular:     Rate and Rhythm: Normal rate and regular rhythm.     Pulses: Normal pulses.     Heart sounds: Normal heart sounds. No murmur heard.    No friction rub. No gallop.  Pulmonary:     Effort: Pulmonary effort is normal. No respiratory distress.     Breath sounds: Normal breath sounds. No stridor. No wheezing or rhonchi.  Abdominal:     General: Bowel sounds are normal. There is no distension.     Palpations: Abdomen is soft. There is no mass.     Tenderness: There is no abdominal tenderness. There is no guarding or rebound.     Hernia: No hernia is present.  Genitourinary:    Prostate: Normal.     Rectum: Normal.  Musculoskeletal:     Right lower leg: No edema.     Left lower leg: No edema.  Skin:    Coloration: Skin is not jaundiced or pale.     Findings: No bruising, erythema, lesion or rash.  Neurological:     General: No focal deficit present.     Mental Status: He is alert and oriented to person, place, and time.     Cranial Nerves: No cranial nerve deficit.     Sensory: No sensory deficit.     Motor: No weakness.     Coordination: Coordination normal.     Gait: Gait normal.     Deep Tendon Reflexes: Reflexes normal.           Assessment & Plan:  Fatigue, unspecified type - Plan: CBC with Differential/Platelet, COMPLETE METABOLIC PANEL WITH GFR, TSH, Vitamin B12  BRBPR (bright red blood per rectum) Patient's blood pressure looks excellent.  However I am concerned by his fatigue.  I will  check a CBC a CMP a TSH and a B12.  If labs are normal I have recommended a colonoscopy to evaluate the source of the bright red blood per rectum.  Physical exam today is normal

## 2022-08-31 LAB — CBC WITH DIFFERENTIAL/PLATELET
Absolute Monocytes: 592 cells/uL (ref 200–950)
Basophils Absolute: 33 cells/uL (ref 0–200)
Basophils Relative: 0.5 %
Eosinophils Absolute: 52 cells/uL (ref 15–500)
Eosinophils Relative: 0.8 %
HCT: 38.9 % (ref 38.5–50.0)
Hemoglobin: 13 g/dL — ABNORMAL LOW (ref 13.2–17.1)
Lymphs Abs: 2483 cells/uL (ref 850–3900)
MCH: 29.5 pg (ref 27.0–33.0)
MCHC: 33.4 g/dL (ref 32.0–36.0)
MCV: 88.2 fL (ref 80.0–100.0)
MPV: 9.7 fL (ref 7.5–12.5)
Monocytes Relative: 9.1 %
Neutro Abs: 3341 cells/uL (ref 1500–7800)
Neutrophils Relative %: 51.4 %
Platelets: 316 10*3/uL (ref 140–400)
RBC: 4.41 10*6/uL (ref 4.20–5.80)
RDW: 12.2 % (ref 11.0–15.0)
Total Lymphocyte: 38.2 %
WBC: 6.5 10*3/uL (ref 3.8–10.8)

## 2022-08-31 LAB — COMPLETE METABOLIC PANEL WITH GFR
AG Ratio: 1.5 (calc) (ref 1.0–2.5)
ALT: 12 U/L (ref 9–46)
AST: 18 U/L (ref 10–35)
Albumin: 4.3 g/dL (ref 3.6–5.1)
Alkaline phosphatase (APISO): 117 U/L (ref 35–144)
BUN: 15 mg/dL (ref 7–25)
CO2: 23 mmol/L (ref 20–32)
Calcium: 10.1 mg/dL (ref 8.6–10.3)
Chloride: 106 mmol/L (ref 98–110)
Creat: 0.96 mg/dL (ref 0.70–1.35)
Globulin: 2.8 g/dL (calc) (ref 1.9–3.7)
Glucose, Bld: 90 mg/dL (ref 65–99)
Potassium: 4.2 mmol/L (ref 3.5–5.3)
Sodium: 141 mmol/L (ref 135–146)
Total Bilirubin: 0.4 mg/dL (ref 0.2–1.2)
Total Protein: 7.1 g/dL (ref 6.1–8.1)
eGFR: 86 mL/min/{1.73_m2} (ref >=60)

## 2022-08-31 LAB — VITAMIN B12: Vitamin B-12: 363 pg/mL (ref 200–1100)

## 2022-08-31 LAB — TSH: TSH: 0.86 mIU/L (ref 0.40–4.50)

## 2022-09-11 ENCOUNTER — Ambulatory Visit: Payer: Medicare HMO | Admitting: Gastroenterology

## 2022-09-11 ENCOUNTER — Encounter: Payer: Self-pay | Admitting: Gastroenterology

## 2022-09-11 NOTE — Progress Notes (Deleted)
GI Office Note    Referring Provider: Donita Brooks, MD Primary Care Physician:  Donita Brooks, MD  Primary Gastroenterologist: formerly Dr. Karilyn Cota  Chief Complaint   No chief complaint on file.    History of Present Illness   Brandon [name] is a 72 y.o. male presenting today at the request of Dr. Tanya Nones for rectal bleeding.       Colonoscopy 12/2011: interna/external hemorrhoids  Medications   Current Outpatient Medications  Medication Sig Dispense Refill   losartan (COZAAR) 50 MG tablet Take 1 tablet by mouth once daily 90 tablet 0   pramipexole (MIRAPEX) 0.25 MG tablet Take 1 tablet (0.25 mg total) by mouth at bedtime. 30 tablet 1   No current facility-administered medications for this visit.    Allergies   Allergies as of 09/11/2022   (No Known Allergies)    Past Medical History   Past Medical History:  Diagnosis Date   Hypertension    Syphilis     Past Surgical History   Past Surgical History:  Procedure Laterality Date   BACK SURGERY     4 yrs ago   COLONOSCOPY  01/22/2012   Procedure: COLONOSCOPY;  Surgeon: Malissa Hippo, MD;  [facility];  Service: Endoscopy;  Laterality: N/A;  200    Past Family History   Family History  Problem Relation Age of Onset   Diabetes Mother    Hypertension Mother    Breast cancer Mother    Alcohol abuse Father    Aneurysm Father    Breast cancer Daughter    Colon cancer Neg Hx     Past Social History   Social History   Socioeconomic History   Marital status: Legally Separated    Spouse name: Not on file   Number of children: 3   Years of education: 12   Highest education level: High school graduate  Occupational History   Occupation: Retired- IT trainer  Tobacco Use   Smoking status: Every Day    Packs/day: 0.50    Years: 45.00    Additional pack years: 0.00    Total pack years: 22.50    Types: Cigarettes   Smokeless tobacco: Never  Vaping Use   Vaping Use:  Never used  Substance and Sexual Activity   Alcohol use: No   Drug use: No   Sexual activity: Yes    Birth control/protection: None  Other Topics Concern   Not on file  Social History Narrative   Grew up in [place], [place]. 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    Married for 17 years. Has 3 children, 11 grandchildren, 4 great grandchildren.   Wife had breast cancer and she died eight years ago. Remarried, wife is younger.    Wears seatbelt.   Eats all food groups.    Drinks 3-4 sodas per day.   Right-handed.   Social Determinants of Health   Financial Resource Strain: Low Risk  (04/08/2018)   Overall Financial Resource Strain (CARDIA)    Difficulty of Paying Living Expenses: Not hard at all  Food Insecurity: No Food Insecurity (11/23/2021)   Hunger Vital Sign    Worried About Running Out of Food in the Last Year: Never true    Ran Out of Food in the Last Year: Never true  Transportation Needs: No Transportation Needs (11/23/2021)   PRAPARE - Administrator, Civil Service (Medical): No    Lack of Transportation (  Non-Medical): No  Physical Activity: Insufficiently Active (11/23/2021)   Exercise Vital Sign    Days of Exercise per Week: 2 days    Minutes of Exercise per Session: 60 min  Stress: No Stress Concern Present (11/23/2021)   [employer] of Occupational Health - Occupational Stress Questionnaire    Feeling of Stress : Not at all  Social Connections: Socially Integrated (11/23/2021)   Social Connection and Isolation Panel [NHANES]    Frequency of Communication with Friends and Family: More than three times a week    Frequency of Social Gatherings with Friends and Family: More than three times a week    Attends Religious Services: More than 4 times per year    Active Member of [employer] or Organizations: Yes    Attends Engineer, structural: More than 4 times per year    Marital Status: Married  Catering manager Violence: Not At Risk (11/23/2021)    Humiliation, Afraid, Rape, and Kick questionnaire    Fear of Current or Ex-Partner: No    Emotionally Abused: No    Physically Abused: No    Sexually Abused: No    Review of Systems   General: Negative for anorexia, weight loss, fever, chills, fatigue, weakness. Eyes: Negative for vision changes.  ENT: Negative for hoarseness, difficulty swallowing , nasal congestion. CV: Negative for chest pain, angina, palpitations, dyspnea on exertion, peripheral edema.  Respiratory: Negative for dyspnea at rest, dyspnea on exertion, cough, sputum, wheezing.  GI: See history of present illness. GU:  Negative for dysuria, hematuria, urinary incontinence, urinary frequency, nocturnal urination.  MS: Negative for joint pain, low back pain.  Derm: Negative for rash or itching.  Neuro: Negative for weakness, abnormal sensation, seizure, frequent headaches, memory loss,  confusion.  Psych: Negative for anxiety, depression, suicidal ideation, hallucinations.  Endo: Negative for unusual weight change.  Heme: Negative for bruising or bleeding. Allergy: Negative for rash or hives.  Physical Exam   There were no vitals taken for this visit.   General: Well-nourished, well-developed in no acute distress.  Head: Normocephalic, atraumatic.   Eyes: Conjunctiva pink, no icterus. Mouth: Oropharyngeal mucosa moist and pink , no lesions erythema or exudate. Neck: Supple without thyromegaly, masses, or lymphadenopathy.  Lungs: Clear to auscultation bilaterally.  Heart: Regular rate and rhythm, no murmurs rubs or gallops.  Abdomen: Bowel sounds are normal, nontender, nondistended, no hepatosplenomegaly or masses,  no abdominal bruits or hernia, no rebound or guarding.   Rectal: *** Extremities: No lower extremity edema. No clubbing or deformities.  Neuro: Alert and oriented x 4 , grossly normal neurologically.  Skin: Warm and dry, no rash or jaundice.   Psych: Alert and cooperative, normal mood and  affect.  Labs   Lab Results  Component Value Date   CREATININE 0.96 08/30/2022   BUN 15 08/30/2022   NA 141 08/30/2022   K 4.2 08/30/2022   CL 106 08/30/2022   CO2 23 08/30/2022   Lab Results  Component Value Date   VITAMINB12 363 08/30/2022   Lab Results  Component Value Date   TSH 0.86 08/30/2022   Lab Results  Component Value Date   WBC 6.5 08/30/2022   HGB 13.0 (L) 08/30/2022   HCT 38.9 08/30/2022   MCV 88.2 08/30/2022   PLT 316 08/30/2022    Imaging Studies   No results found.  Assessment       PLAN   ***   Leanna Battles. Melvyn Neth, MHS, PA-C [facility]

## 2022-10-19 DIAGNOSIS — Z743 Need for continuous supervision: Secondary | ICD-10-CM | POA: Diagnosis not present

## 2022-10-19 DIAGNOSIS — R609 Edema, unspecified: Secondary | ICD-10-CM | POA: Diagnosis not present

## 2022-10-19 DIAGNOSIS — I10 Essential (primary) hypertension: Secondary | ICD-10-CM | POA: Diagnosis not present

## 2022-10-19 DIAGNOSIS — R0689 Other abnormalities of breathing: Secondary | ICD-10-CM | POA: Diagnosis not present

## 2022-10-19 DIAGNOSIS — R1084 Generalized abdominal pain: Secondary | ICD-10-CM | POA: Diagnosis not present

## 2022-10-20 ENCOUNTER — Emergency Department (HOSPITAL_COMMUNITY): Payer: Medicare HMO

## 2022-10-20 ENCOUNTER — Other Ambulatory Visit: Payer: Self-pay

## 2022-10-20 ENCOUNTER — Encounter (HOSPITAL_COMMUNITY): Payer: Self-pay

## 2022-10-20 ENCOUNTER — Emergency Department (HOSPITAL_COMMUNITY)
Admission: EM | Admit: 2022-10-20 | Discharge: 2022-10-20 | Disposition: A | Discharge status: Home / Self Care | Payer: Medicare HMO | Attending: Emergency Medicine | Admitting: Emergency Medicine

## 2022-10-20 DIAGNOSIS — I700 Atherosclerosis of aorta: Secondary | ICD-10-CM | POA: Diagnosis not present

## 2022-10-20 DIAGNOSIS — K068 Other specified disorders of gingiva and edentulous alveolar ridge: Secondary | ICD-10-CM

## 2022-10-20 DIAGNOSIS — K0889 Other specified disorders of teeth and supporting structures: Secondary | ICD-10-CM | POA: Diagnosis not present

## 2022-10-20 DIAGNOSIS — R7401 Elevation of levels of liver transaminase levels: Secondary | ICD-10-CM | POA: Insufficient documentation

## 2022-10-20 DIAGNOSIS — R1032 Left lower quadrant pain: Secondary | ICD-10-CM | POA: Insufficient documentation

## 2022-10-20 DIAGNOSIS — R109 Unspecified abdominal pain: Secondary | ICD-10-CM | POA: Diagnosis not present

## 2022-10-20 DIAGNOSIS — Z79899 Other long term (current) drug therapy: Secondary | ICD-10-CM | POA: Diagnosis not present

## 2022-10-20 DIAGNOSIS — I10 Essential (primary) hypertension: Secondary | ICD-10-CM | POA: Insufficient documentation

## 2022-10-20 DIAGNOSIS — K5900 Constipation, unspecified: Secondary | ICD-10-CM | POA: Diagnosis not present

## 2022-10-20 LAB — COMPREHENSIVE METABOLIC PANEL
ALT: 160 U/L — ABNORMAL HIGH (ref 0–44)
AST: 95 U/L — ABNORMAL HIGH (ref 15–41)
Albumin: 3.9 g/dL (ref 3.5–5.0)
Alkaline Phosphatase: 108 U/L (ref 38–126)
Anion gap: 12 (ref 5–15)
BUN: 29 mg/dL — ABNORMAL HIGH (ref 8–23)
CO2: 25 mmol/L (ref 22–32)
Calcium: 10.4 mg/dL — ABNORMAL HIGH (ref 8.9–10.3)
Chloride: 100 mmol/L (ref 98–111)
Creatinine, Ser: 1.26 mg/dL — ABNORMAL HIGH (ref 0.61–1.24)
GFR, Estimated: >60 mL/min (ref >60)
Glucose, Bld: 92 mg/dL (ref 70–99)
Potassium: 3.8 mmol/L (ref 3.5–5.1)
Sodium: 137 mmol/L (ref 135–145)
Total Bilirubin: 1.4 mg/dL — ABNORMAL HIGH (ref 0.3–1.2)
Total Protein: 7.6 g/dL (ref 6.5–8.1)

## 2022-10-20 LAB — CBC WITH DIFFERENTIAL/PLATELET
Abs Immature Granulocytes: 0.02 10*3/uL (ref 0.00–0.07)
Basophils Absolute: 0 10*3/uL (ref 0.0–0.1)
Basophils Relative: 1 %
Eosinophils Absolute: 0.1 10*3/uL (ref 0.0–0.5)
Eosinophils Relative: 1 %
HCT: 44.1 % (ref 39.0–52.0)
Hemoglobin: 14.6 g/dL (ref 13.0–17.0)
Immature Granulocytes: 0 %
Lymphocytes Relative: 48 %
Lymphs Abs: 2.7 10*3/uL (ref 0.7–4.0)
MCH: 29.4 pg (ref 26.0–34.0)
MCHC: 33.1 g/dL (ref 30.0–36.0)
MCV: 88.9 fL (ref 80.0–100.0)
Monocytes Absolute: 0.6 10*3/uL (ref 0.1–1.0)
Monocytes Relative: 11 %
Neutro Abs: 2.2 10*3/uL (ref 1.7–7.7)
Neutrophils Relative %: 39 %
Platelets: 232 10*3/uL (ref 150–400)
RBC: 4.96 MIL/uL (ref 4.22–5.81)
RDW: 12 % (ref 11.5–15.5)
WBC: 5.7 10*3/uL (ref 4.0–10.5)
nRBC: 0 % (ref 0.0–0.2)

## 2022-10-20 LAB — LIPASE, BLOOD: Lipase: 41 U/L (ref 11–51)

## 2022-10-20 MED ORDER — POLYETHYLENE GLYCOL 3350 17 G PO PACK: 17.0000 g | PACK | Freq: Every day | ORAL | 0 refills | Status: DC

## 2022-10-20 MED ORDER — SODIUM CHLORIDE 0.9 % IV BOLUS
1000.0000 mL | Freq: Once | INTRAVENOUS | Status: AC
  Administered 2022-10-20: 1000 mL via INTRAVENOUS

## 2022-10-20 MED ORDER — IOHEXOL 300 MG/ML  SOLN
100.0000 mL | Freq: Once | INTRAMUSCULAR | Status: AC | PRN
  Administered 2022-10-20: 100 mL via INTRAVENOUS

## 2022-10-20 MED ORDER — ONDANSETRON 8 MG PO TBDP: 8.0000 mg | ORAL_TABLET | Freq: Three times a day (TID) | ORAL | 0 refills | Status: DC | PRN

## 2022-10-20 MED ORDER — ONDANSETRON HCL 4 MG/2ML IJ SOLN
4.0000 mg | Freq: Once | INTRAMUSCULAR | Status: AC
  Administered 2022-10-20: 4 mg via INTRAVENOUS
  Filled 2022-10-20: qty 2

## 2022-10-20 MED ORDER — MORPHINE SULFATE (PF) 4 MG/ML IV SOLN
4.0000 mg | Freq: Once | INTRAVENOUS | Status: AC
  Administered 2022-10-20: 4 mg via INTRAVENOUS
  Filled 2022-10-20: qty 1

## 2022-10-20 NOTE — ED Provider Notes (Signed)
[place] EMERGENCY DEPARTMENT AT [facility] Provider Note   CSN: [id number] Arrival date & time: 10/20/22  0000     History  Chief Complaint  Patient presents with   Abdominal Pain    Brandon [name] is a 72 y.o. male.  The history is provided by the patient.  Abdominal Pain He has history of hypertension, syphilis and comes in complaining of abdominal pain and several days later he started having pain in the left lower abdomen.  He also relates that he has not been able to eat and drink because of his gums being very painful.  He denies fever, chills.  He denies nausea or vomiting.  He endorses decreased urine output and decreased stool output.   Home Medications Prior to Admission medications   Medication Sig Start Date End Date Taking? Authorizing Provider  losartan (COZAAR) 50 MG tablet Take 1 tablet by mouth once daily 06/07/22   Donita Brooks, MD  pramipexole (MIRAPEX) 0.25 MG tablet Take 1 tablet (0.25 mg total) by mouth at bedtime. 01/08/22   Donita Brooks, MD      Allergies    Patient has no known allergies.    Review of Systems   Review of Systems  Gastrointestinal:  Positive for abdominal pain.  All other systems reviewed and are negative.   Physical Exam Updated Vital Signs BP (!) 155/102   Pulse 62   Temp 98.5 F (36.9 C) (Oral)   Resp 18   Ht 6\' 4"  (1.93 m)   Wt 89.3 kg   SpO2 99%   BMI 23.96 kg/m  Physical Exam Vitals and nursing note reviewed.   72 year old male, resting comfortably and in no acute distress. Vital signs are significant for elevated blood pressure. Oxygen saturation is 99%, which is normal. Head is normocephalic and atraumatic. PERRLA, EOMI. Upper teeth have been extracted, and multiple sockets continue to show some erythema without swelling and without drainage or bleeding. Neck is nontender and supple without adenopathy or JVD. Back is nontender and there is no CVA tenderness. Lungs are clear without  rales, wheezes, or rhonchi. Chest is nontender. Heart has regular rate and rhythm without murmur. Abdomen is soft, flat, with tenderness fairly well localized to the left lower quadrant.  There is no rebound or guarding. Extremities have no cyanosis or edema, full range of motion is present. Skin is warm and dry without rash. Neurologic: Mental status is normal, cranial nerves are intact, moves all extremities equally.  ED Results / Procedures / Treatments   Labs (all labs ordered are listed, but only abnormal results are displayed) Labs Reviewed  COMPREHENSIVE METABOLIC PANEL - Abnormal; Notable for the following components:      Result Value   BUN 29 (*)    Creatinine, Ser 1.26 (*)    Calcium 10.4 (*)    AST 95 (*)    ALT 160 (*)    Total Bilirubin 1.4 (*)    All other components within normal limits  LIPASE, BLOOD  CBC WITH DIFFERENTIAL/PLATELET   Radiology CT ABDOMEN PELVIS W CONTRAST  Result Date: 10/20/2022 CLINICAL DATA:  Left lower quadrant pain. EXAM: CT ABDOMEN AND PELVIS WITH CONTRAST TECHNIQUE: Multidetector CT imaging of the abdomen and pelvis was performed using the standard protocol following bolus administration of intravenous contrast. RADIATION DOSE REDUCTION: This exam was performed according to the departmental dose-optimization program which includes automated exposure control, adjustment of the mA and/or kV according to patient size and/or  use of iterative reconstruction technique. CONTRAST:  OMNIPAQUE IOHEXOL 300 MG/ML  SOLN COMPARISON:  None Available. FINDINGS: Lower chest: No acute abnormality. Hepatobiliary: No focal liver abnormality is seen. The gallbladder is moderately distended without evidence of gallstones, gallbladder wall thickening or biliary dilatation. Pancreas: Several punctate calcifications are seen within an otherwise normal appearing pancreas. No pancreatic ductal dilatation or surrounding inflammatory changes. Spleen: Normal in size  without focal abnormality. Adrenals/Urinary Tract: Adrenal glands are unremarkable. Kidneys are normal, without renal calculi, focal lesion, or hydronephrosis. Bladder is unremarkable. Stomach/Bowel: Stomach is within normal limits. The appendix is not clearly identified. No evidence of bowel wall thickening, distention, or inflammatory changes. A moderate to marked amount of stool is seen within the ascending colon, proximal to mid transverse colon and mid to distal sigmoid colon. Vascular/Lymphatic: Aortic atherosclerosis. No enlarged abdominal or pelvic lymph nodes. Reproductive: Moderate to marked severity prostate gland enlargement is noted. Other: No abdominal wall hernia or abnormality. No abdominopelvic ascites. Musculoskeletal: Postoperative changes are seen within the mid and lower lumbar spine. IMPRESSION: 1. Moderate to marked severity constipation. 2. Prostatomegaly.  Correlation with PSA levels is recommended. 3. Postoperative changes within the mid and lower lumbar spine. 4. Aortic atherosclerosis. Aortic Atherosclerosis (ICD10-I70.0). Electronically Signed   By: Aram Candela M.D.   On: 10/20/2022 02:13    Procedures Procedures    Medications Ordered in ED Medications  sodium chloride 0.9 % bolus 1,000 mL (1,000 mLs Intravenous New Bag/Given 10/20/22 0209)  ondansetron (ZOFRAN) injection 4 mg (4 mg Intravenous Given 10/20/22 0210)  morphine (PF) 4 MG/ML injection 4 mg (4 mg Intravenous Given 10/20/22 0211)  iohexol (OMNIPAQUE) 300 MG/ML solution 100 mL (100 mLs Intravenous Contrast Given 10/20/22 0155)    ED Course/ Medical Decision Making/ A&P                             Medical Decision Making Amount and/or Complexity of Data Reviewed Labs: ordered. Radiology: ordered.  Risk Prescription drug management.   Left lower quadrant pain.  Differential diagnosis includes, but is not limited to, urolithiasis, pyelonephritis, diverticulitis.  I have reviewed and interpreted his  laboratory test, and my interpretation is renal insufficiency which has worsened slightly compared with 08/31/2022, mild elevation of transaminases which is also new compared with 08/31/2022, normal lipase, normal CBC.  I have ordered IV fluids and morphine for pain, ondansetron for nausea.  I have ordered CT of abdomen and pelvis to evaluate for possible diverticulitis.  CT scan shows evidence of constipation but no acute process.  Have independently viewed these images, and agree with radiologist interpretation.  He feels significantly better following above-noted treatment.  I am giving him prescriptions for ondansetron and polyethylene glycol.  He will need to follow-up with his dentist regarding his gingival pain.  Return to emergency department for new or concerning symptoms.  Final Clinical Impression(s) / ED Diagnoses Final diagnoses:  LLQ abdominal pain  Pain of gingiva  Elevated transaminase level    Rx / DC Orders ED Discharge Orders          Ordered    ondansetron (ZOFRAN-ODT) 8 MG disintegrating tablet  Every 8 hours PRN        10/20/22 0307    polyethylene glycol (MIRALAX / GLYCOLAX) 17 g packet  Daily        10/20/22 0307              Dione Booze, MD  10/20/22 0314  

## 2022-10-20 NOTE — ED Triage Notes (Signed)
Pt arrived from home via [facility] c/o LLQ Abdominal pain relieved when pressure. Pt reports irregular bowels movements and decreased appetite. Pt reports it has "been a month" since having last BM. Pt reports recent oral surgery and has had some emesis with streaks of blood. Pt started on oral antibiotics for dental infection.

## 2022-10-20 NOTE — Discharge Instructions (Addendum)
Use topical painkillers like Orajel as needed.  Take ibuprofen and/or acetaminophen as needed for pain.  Your CT scan suggest that you are constipated.  Take MiraLAX as needed to help treat constipation.  Follow-up with your dentist regarding your teeth and gums.  Return to the emergency department if you have any new or concerning symptoms.

## 2022-10-25 ENCOUNTER — Telehealth: Payer: Self-pay

## 2022-10-25 NOTE — Telephone Encounter (Signed)
Transition Care Management Unsuccessful Follow-up Telephone Call  Date of discharge and from where:  [facility] 5/26  Attempts:  1st Attempt  Reason for unsuccessful TCM follow-up call:  Left voice message   [name] Pop [facility] Care Guide, [place] [phone] [address], [place], [place] [address] Phone: [phone] Email: [email or web address]       

## 2022-10-27 ENCOUNTER — Other Ambulatory Visit: Payer: Self-pay

## 2022-10-27 ENCOUNTER — Inpatient Hospital Stay (HOSPITAL_COMMUNITY)
Admission: EM | Admit: 2022-10-27 | Discharge: 2022-11-03 | DRG: 684 | Disposition: A | Discharge status: Home Health | Payer: Medicare HMO | Attending: Internal Medicine | Admitting: Internal Medicine

## 2022-10-27 ENCOUNTER — Encounter (HOSPITAL_COMMUNITY): Payer: Self-pay

## 2022-10-27 DIAGNOSIS — Z79899 Other long term (current) drug therapy: Secondary | ICD-10-CM

## 2022-10-27 DIAGNOSIS — Z803 Family history of malignant neoplasm of breast: Secondary | ICD-10-CM

## 2022-10-27 DIAGNOSIS — E860 Dehydration: Secondary | ICD-10-CM

## 2022-10-27 DIAGNOSIS — R42 Dizziness and giddiness: Secondary | ICD-10-CM

## 2022-10-27 DIAGNOSIS — I951 Orthostatic hypotension: Secondary | ICD-10-CM

## 2022-10-27 DIAGNOSIS — R531 Weakness: Secondary | ICD-10-CM | POA: Diagnosis not present

## 2022-10-27 DIAGNOSIS — I959 Hypotension, unspecified: Secondary | ICD-10-CM | POA: Diagnosis not present

## 2022-10-27 DIAGNOSIS — Z743 Need for continuous supervision: Secondary | ICD-10-CM | POA: Diagnosis not present

## 2022-10-27 DIAGNOSIS — N179 Acute kidney failure, unspecified: Secondary | ICD-10-CM | POA: Diagnosis not present

## 2022-10-27 DIAGNOSIS — D649 Anemia, unspecified: Secondary | ICD-10-CM

## 2022-10-27 DIAGNOSIS — G2581 Restless legs syndrome: Secondary | ICD-10-CM | POA: Diagnosis present

## 2022-10-27 DIAGNOSIS — R7989 Other specified abnormal findings of blood chemistry: Secondary | ICD-10-CM | POA: Diagnosis present

## 2022-10-27 DIAGNOSIS — D638 Anemia in other chronic diseases classified elsewhere: Secondary | ICD-10-CM | POA: Diagnosis present

## 2022-10-27 DIAGNOSIS — E876 Hypokalemia: Secondary | ICD-10-CM

## 2022-10-27 DIAGNOSIS — I10 Essential (primary) hypertension: Secondary | ICD-10-CM | POA: Diagnosis present

## 2022-10-27 DIAGNOSIS — M5136 Other intervertebral disc degeneration, lumbar region: Secondary | ICD-10-CM | POA: Diagnosis present

## 2022-10-27 DIAGNOSIS — F17210 Nicotine dependence, cigarettes, uncomplicated: Secondary | ICD-10-CM | POA: Diagnosis present

## 2022-10-27 DIAGNOSIS — R000 Tachycardia, unspecified: Secondary | ICD-10-CM | POA: Diagnosis not present

## 2022-10-27 DIAGNOSIS — Z720 Tobacco use: Secondary | ICD-10-CM

## 2022-10-27 DIAGNOSIS — Z8249 Family history of ischemic heart disease and other diseases of the circulatory system: Secondary | ICD-10-CM

## 2022-10-27 DIAGNOSIS — R55 Syncope and collapse: Secondary | ICD-10-CM | POA: Diagnosis present

## 2022-10-27 DIAGNOSIS — Z833 Family history of diabetes mellitus: Secondary | ICD-10-CM

## 2022-10-27 DIAGNOSIS — Z8619 Personal history of other infectious and parasitic diseases: Secondary | ICD-10-CM

## 2022-10-27 MED ORDER — SODIUM CHLORIDE 0.9 % IV BOLUS
1000.0000 mL | Freq: Once | INTRAVENOUS | Status: AC
  Administered 2022-10-27: 1000 mL via INTRAVENOUS

## 2022-10-27 NOTE — ED Triage Notes (Signed)
PT BIB [facility] with a c/o of syncope.PT states that he had his teeth pulled 5 weeks ago and has had no appetite since.PT was found on bathroom floor, had been laying there for approximately 24 hrs and had defecated on himself.PT doesn't remember hitting his head but did have LOC.Family arrived to try and get him up, he passed out again, EMS stated the same happened when they tried to get him up. 470 Liters of fluid were given.

## 2022-10-28 ENCOUNTER — Inpatient Hospital Stay (HOSPITAL_COMMUNITY): Payer: Medicare HMO

## 2022-10-28 ENCOUNTER — Encounter (HOSPITAL_COMMUNITY): Payer: Self-pay | Admitting: Family Medicine

## 2022-10-28 ENCOUNTER — Emergency Department (HOSPITAL_COMMUNITY): Payer: Medicare HMO

## 2022-10-28 DIAGNOSIS — G2581 Restless legs syndrome: Secondary | ICD-10-CM | POA: Diagnosis not present

## 2022-10-28 DIAGNOSIS — Z8249 Family history of ischemic heart disease and other diseases of the circulatory system: Secondary | ICD-10-CM | POA: Diagnosis not present

## 2022-10-28 DIAGNOSIS — D649 Anemia, unspecified: Secondary | ICD-10-CM | POA: Diagnosis not present

## 2022-10-28 DIAGNOSIS — R1011 Right upper quadrant pain: Secondary | ICD-10-CM | POA: Diagnosis not present

## 2022-10-28 DIAGNOSIS — D509 Iron deficiency anemia, unspecified: Secondary | ICD-10-CM | POA: Diagnosis not present

## 2022-10-28 DIAGNOSIS — R945 Abnormal results of liver function studies: Secondary | ICD-10-CM | POA: Diagnosis not present

## 2022-10-28 DIAGNOSIS — R42 Dizziness and giddiness: Secondary | ICD-10-CM | POA: Diagnosis not present

## 2022-10-28 DIAGNOSIS — Z8619 Personal history of other infectious and parasitic diseases: Secondary | ICD-10-CM | POA: Diagnosis not present

## 2022-10-28 DIAGNOSIS — Z833 Family history of diabetes mellitus: Secondary | ICD-10-CM | POA: Diagnosis not present

## 2022-10-28 DIAGNOSIS — M5136 Other intervertebral disc degeneration, lumbar region: Secondary | ICD-10-CM | POA: Diagnosis not present

## 2022-10-28 DIAGNOSIS — E860 Dehydration: Secondary | ICD-10-CM | POA: Diagnosis not present

## 2022-10-28 DIAGNOSIS — K760 Fatty (change of) liver, not elsewhere classified: Secondary | ICD-10-CM | POA: Diagnosis not present

## 2022-10-28 DIAGNOSIS — F17210 Nicotine dependence, cigarettes, uncomplicated: Secondary | ICD-10-CM | POA: Diagnosis not present

## 2022-10-28 DIAGNOSIS — I10 Essential (primary) hypertension: Secondary | ICD-10-CM

## 2022-10-28 DIAGNOSIS — I700 Atherosclerosis of aorta: Secondary | ICD-10-CM | POA: Diagnosis not present

## 2022-10-28 DIAGNOSIS — R55 Syncope and collapse: Secondary | ICD-10-CM

## 2022-10-28 DIAGNOSIS — N179 Acute kidney failure, unspecified: Secondary | ICD-10-CM | POA: Diagnosis not present

## 2022-10-28 DIAGNOSIS — Z803 Family history of malignant neoplasm of breast: Secondary | ICD-10-CM | POA: Diagnosis not present

## 2022-10-28 DIAGNOSIS — R7989 Other specified abnormal findings of blood chemistry: Secondary | ICD-10-CM | POA: Diagnosis present

## 2022-10-28 DIAGNOSIS — Z79899 Other long term (current) drug therapy: Secondary | ICD-10-CM | POA: Diagnosis not present

## 2022-10-28 DIAGNOSIS — I951 Orthostatic hypotension: Secondary | ICD-10-CM | POA: Diagnosis not present

## 2022-10-28 DIAGNOSIS — R109 Unspecified abdominal pain: Secondary | ICD-10-CM | POA: Diagnosis not present

## 2022-10-28 DIAGNOSIS — E876 Hypokalemia: Secondary | ICD-10-CM | POA: Diagnosis not present

## 2022-10-28 DIAGNOSIS — D638 Anemia in other chronic diseases classified elsewhere: Secondary | ICD-10-CM | POA: Diagnosis not present

## 2022-10-28 LAB — CBC
HCT: 32.7 % — ABNORMAL LOW (ref 39.0–52.0)
Hemoglobin: 10.9 g/dL — ABNORMAL LOW (ref 13.0–17.0)
MCH: 30 pg (ref 26.0–34.0)
MCHC: 33.3 g/dL (ref 30.0–36.0)
MCV: 90.1 fL (ref 80.0–100.0)
Platelets: 251 10*3/uL (ref 150–400)
RBC: 3.63 MIL/uL — ABNORMAL LOW (ref 4.22–5.81)
RDW: 12.6 % (ref 11.5–15.5)
WBC: 11.5 10*3/uL — ABNORMAL HIGH (ref 4.0–10.5)
nRBC: 0 % (ref 0.0–0.2)

## 2022-10-28 LAB — CBC WITH DIFFERENTIAL/PLATELET
Abs Immature Granulocytes: 0.04 10*3/uL (ref 0.00–0.07)
Basophils Absolute: 0 10*3/uL (ref 0.0–0.1)
Basophils Relative: 0 %
Eosinophils Absolute: 0 10*3/uL (ref 0.0–0.5)
Eosinophils Relative: 0 %
HCT: 35.9 % — ABNORMAL LOW (ref 39.0–52.0)
Hemoglobin: 11.6 g/dL — ABNORMAL LOW (ref 13.0–17.0)
Immature Granulocytes: 0 %
Lymphocytes Relative: 18 %
Lymphs Abs: 2 10*3/uL (ref 0.7–4.0)
MCH: 28.9 pg (ref 26.0–34.0)
MCHC: 32.3 g/dL (ref 30.0–36.0)
MCV: 89.5 fL (ref 80.0–100.0)
Monocytes Absolute: 1 10*3/uL (ref 0.1–1.0)
Monocytes Relative: 8 %
Neutro Abs: 8.6 10*3/uL — ABNORMAL HIGH (ref 1.7–7.7)
Neutrophils Relative %: 74 %
Platelets: 260 10*3/uL (ref 150–400)
RBC: 4.01 MIL/uL — ABNORMAL LOW (ref 4.22–5.81)
RDW: 12.4 % (ref 11.5–15.5)
WBC: 11.6 10*3/uL — ABNORMAL HIGH (ref 4.0–10.5)
nRBC: 0 % (ref 0.0–0.2)

## 2022-10-28 LAB — COMPREHENSIVE METABOLIC PANEL
ALT: 142 U/L — ABNORMAL HIGH (ref 0–44)
ALT: 135 U/L — ABNORMAL HIGH (ref 0–44)
AST: 92 U/L — ABNORMAL HIGH (ref 15–41)
AST: 87 U/L — ABNORMAL HIGH (ref 15–41)
Albumin: 3.2 g/dL — ABNORMAL LOW (ref 3.5–5.0)
Albumin: 3 g/dL — ABNORMAL LOW (ref 3.5–5.0)
Alkaline Phosphatase: 108 U/L (ref 38–126)
Alkaline Phosphatase: 104 U/L (ref 38–126)
Anion gap: 15 (ref 5–15)
Anion gap: 11 (ref 5–15)
BUN: 51 mg/dL — ABNORMAL HIGH (ref 8–23)
BUN: 50 mg/dL — ABNORMAL HIGH (ref 8–23)
CO2: 22 mmol/L (ref 22–32)
CO2: 25 mmol/L (ref 22–32)
Calcium: 10 mg/dL (ref 8.9–10.3)
Calcium: 9.5 mg/dL (ref 8.9–10.3)
Chloride: 95 mmol/L — ABNORMAL LOW (ref 98–111)
Chloride: 98 mmol/L (ref 98–111)
Creatinine, Ser: 2.46 mg/dL — ABNORMAL HIGH (ref 0.61–1.24)
Creatinine, Ser: 2.05 mg/dL — ABNORMAL HIGH (ref 0.61–1.24)
GFR, Estimated: 28 mL/min — ABNORMAL LOW (ref >60)
GFR, Estimated: 34 mL/min — ABNORMAL LOW (ref >60)
Glucose, Bld: 128 mg/dL — ABNORMAL HIGH (ref 70–99)
Glucose, Bld: 107 mg/dL — ABNORMAL HIGH (ref 70–99)
Potassium: 4 mmol/L (ref 3.5–5.1)
Potassium: 4 mmol/L (ref 3.5–5.1)
Sodium: 132 mmol/L — ABNORMAL LOW (ref 135–145)
Sodium: 134 mmol/L — ABNORMAL LOW (ref 135–145)
Total Bilirubin: 1.4 mg/dL — ABNORMAL HIGH (ref 0.3–1.2)
Total Bilirubin: 1.4 mg/dL — ABNORMAL HIGH (ref 0.3–1.2)
Total Protein: 6.3 g/dL — ABNORMAL LOW (ref 6.5–8.1)
Total Protein: 6.1 g/dL — ABNORMAL LOW (ref 6.5–8.1)

## 2022-10-28 LAB — URINALYSIS, ROUTINE W REFLEX MICROSCOPIC
Bacteria, UA: NONE SEEN
Glucose, UA: NEGATIVE mg/dL
Hgb urine dipstick: NEGATIVE
Ketones, ur: 5 mg/dL — AB
Leukocytes,Ua: NEGATIVE
Nitrite: NEGATIVE
Protein, ur: 30 mg/dL — AB
Specific Gravity, Urine: 1.024 (ref 1.005–1.030)
pH: 5 (ref 5.0–8.0)

## 2022-10-28 LAB — LACTIC ACID, PLASMA
Lactic Acid, Venous: 2.4 mmol/L (ref 0.5–1.9)
Lactic Acid, Venous: 1.3 mmol/L (ref 0.5–1.9)

## 2022-10-28 LAB — ECHOCARDIOGRAM COMPLETE
AV Mean grad: 3 mmHg
AV Peak grad: 5.7 mmHg
Ao pk vel: 1.19 m/s
Area-P 1/2: 2.13 cm2
Height: 76 in
S' Lateral: 2.7 cm
Weight: 3149.93 oz

## 2022-10-28 LAB — HIV ANTIBODY (ROUTINE TESTING W REFLEX): HIV Screen 4th Generation wRfx: NONREACTIVE

## 2022-10-28 LAB — PROTIME-INR
INR: 1.4 — ABNORMAL HIGH (ref 0.8–1.2)
Prothrombin Time: 17.3 seconds — ABNORMAL HIGH (ref 11.4–15.2)

## 2022-10-28 LAB — CK: Total CK: 294 U/L (ref 49–397)

## 2022-10-28 LAB — TROPONIN I (HIGH SENSITIVITY): Troponin I (High Sensitivity): 20 ng/L — ABNORMAL HIGH (ref <18)

## 2022-10-28 LAB — MAGNESIUM: Magnesium: 2.1 mg/dL (ref 1.7–2.4)

## 2022-10-28 MED ORDER — ONDANSETRON HCL 4 MG/2ML IJ SOLN
4.0000 mg | Freq: Four times a day (QID) | INTRAMUSCULAR | Status: DC | PRN
  Administered 2022-10-28 – 2022-10-30 (×3): 4 mg via INTRAVENOUS
  Filled 2022-10-28 (×3): qty 2

## 2022-10-28 MED ORDER — SODIUM CHLORIDE 0.9% FLUSH
3.0000 mL | Freq: Two times a day (BID) | INTRAVENOUS | Status: DC
  Administered 2022-10-29 – 2022-11-03 (×8): 3 mL via INTRAVENOUS

## 2022-10-28 MED ORDER — HEPARIN SODIUM (PORCINE) 5000 UNIT/ML IJ SOLN
5000.0000 [IU] | Freq: Three times a day (TID) | INTRAMUSCULAR | Status: DC
  Administered 2022-10-28 – 2022-10-30 (×6): 5000 [IU] via SUBCUTANEOUS
  Filled 2022-10-28 (×6): qty 1

## 2022-10-28 MED ORDER — SODIUM CHLORIDE 0.9 % IV SOLN: INTRAVENOUS | Status: AC

## 2022-10-28 MED ORDER — ONDANSETRON HCL 4 MG PO TABS: 4.0000 mg | ORAL_TABLET | Freq: Four times a day (QID) | ORAL | Status: DC | PRN

## 2022-10-28 NOTE — ED Notes (Signed)
ED TO INPATIENT HANDOFF REPORT  ED Nurse Name and Phone #: Nicholos Johns [phone]  S Name/Age/Gender Brandon [name] 72 y.o. male Room/Bed: 042C/042C  Code Status   Code Status: Full Code  Home/SNF/Other Home Patient oriented to: self, place, time, and situation Is this baseline? Yes   Triage Complete: Triage complete  Chief Complaint AKI (acute kidney injury) (HCC) [N17.9]  Triage Note PT BIB [facility] with a c/o of syncope.PT states that he had his teeth pulled 5 weeks ago and has had no appetite since.PT was found on bathroom floor, had been laying there for approximately 24 hrs and had defecated on himself.PT doesn't remember hitting his head but did have LOC.Family arrived to try and get him up, he passed out again, EMS stated the same happened when they tried to get him up. 470 Liters of fluid were given.   Allergies No Known Allergies  Level of Care/Admitting Diagnosis ED Disposition     ED Disposition  Admit   Condition  --   Comment  Hospital Area: [facility] [[id number]]  Level of Care: Telemetry Medical [104]  May admit patient to [facility] or [facility] if equivalent level of care is available:: Yes  Covid Evaluation: Asymptomatic - no recent exposure (last 10 days) testing not required  Diagnosis: AKI (acute kidney injury) [facility]) [[id number]]  Admitting Physician: Briscoe Deutscher [[id number]]  Attending Physician: Briscoe Deutscher [[id number]]  Certification:: I certify this patient will need inpatient services for at least 2 midnights  Estimated Length of Stay: 4          B Medical/Surgery History Past Medical History:  Diagnosis Date   Hypertension    Syphilis    Past Surgical History:  Procedure Laterality Date   BACK SURGERY     4 yrs ago   COLONOSCOPY  01/22/2012   Procedure: COLONOSCOPY;  Surgeon: Malissa Hippo, MD;  Location: [facility];  Service: Endoscopy;  Laterality: N/A;  200     A IV Location/Drains/Wounds Patient  Lines/Drains/Airways Status     Active Line/Drains/Airways     Name Placement date Placement time Site Days   Peripheral IV 10/27/22 18 G Right Antecubital 10/27/22  2317  Antecubital  1            Intake/Output Last 24 hours  Intake/Output Summary (Last 24 hours) at 10/28/2022 0729 Last data filed at 10/28/2022 0218 Gross per 24 hour  Intake 999 ml  Output --  Net 999 ml    Labs/Imaging Results for orders placed or performed during the hospital encounter of 10/27/22 (from the past 48 hour(s))  CBC with Differential/Platelet     Status: Abnormal   Collection Time: 10/27/22 10:26 PM  Result Value Ref Range   WBC 11.6 (H) 4.0 - 10.5 K/uL   RBC 4.01 (L) 4.22 - 5.81 MIL/uL   Hemoglobin 11.6 (L) 13.0 - 17.0 g/dL   HCT 62.9 (L) 52.8 - 41.3 %   MCV 89.5 80.0 - 100.0 fL   MCH 28.9 26.0 - 34.0 pg   MCHC 32.3 30.0 - 36.0 g/dL   RDW 24.4 01.0 - 27.2 %   Platelets 260 150 - 400 K/uL   nRBC 0.0 0.0 - 0.2 %   Neutrophils Relative % 74 %   Neutro Abs 8.6 (H) 1.7 - 7.7 K/uL   Lymphocytes Relative 18 %   Lymphs Abs 2.0 0.7 - 4.0 K/uL   Monocytes Relative 8 %   Monocytes Absolute 1.0 0.1 -  1.0 K/uL   Eosinophils Relative 0 %   Eosinophils Absolute 0.0 0.0 - 0.5 K/uL   Basophils Relative 0 %   Basophils Absolute 0.0 0.0 - 0.1 K/uL   Immature Granulocytes 0 %   Abs Immature Granulocytes 0.04 0.00 - 0.07 K/uL    Comment: Performed at [facility], [address], [place], [place] [address]  Comprehensive metabolic panel     Status: Abnormal   Collection Time: 10/27/22 10:26 PM  Result Value Ref Range   Sodium 132 (L) 135 - 145 mmol/L   Potassium 4.0 3.5 - 5.1 mmol/L   Chloride 95 (L) 98 - 111 mmol/L   CO2 22 22 - 32 mmol/L   Glucose, Bld 128 (H) 70 - 99 mg/dL    Comment: Glucose reference range applies only to samples taken after fasting for at least 8 hours.   BUN 51 (H) 8 - 23 mg/dL   Creatinine, Ser 6.04 (H) 0.61 - 1.24 mg/dL   Calcium 54.0 8.9 - 98.1 mg/dL   Total  Protein 6.3 (L) 6.5 - 8.1 g/dL   Albumin 3.2 (L) 3.5 - 5.0 g/dL   AST 92 (H) 15 - 41 U/L   ALT 142 (H) 0 - 44 U/L   Alkaline Phosphatase 108 38 - 126 U/L   Total Bilirubin 1.4 (H) 0.3 - 1.2 mg/dL   GFR, Estimated 28 (L) >60 mL/min    Comment: (NOTE) Calculated using the CKD-EPI Creatinine Equation (2021)    Anion gap 15 5 - 15    Comment: Performed at [facility], [address] [place], [place] [address]  Lactic acid, plasma     Status: Abnormal   Collection Time: 10/27/22 10:26 PM  Result Value Ref Range   Lactic Acid, Venous 2.4 (HH) 0.5 - 1.9 mmol/L    Comment: CRITICAL RESULT CALLED TO, READ BACK BY AND VERIFIED WITH T. MORRISON, RN. 0200 10/28/22. LPAIT Performed at [facility], [address] [place], [place] [address]   CK     Status: None   Collection Time: 10/27/22 10:26 PM  Result Value Ref Range   Total CK 294 49 - 397 U/L    Comment: Performed at [facility], [address], [place], [place] [address]  Troponin I (High Sensitivity)     Status: Abnormal   Collection Time: 10/27/22 10:26 PM  Result Value Ref Range   Troponin I (High Sensitivity) 20 (H) <18 ng/L    Comment: (NOTE) Elevated high sensitivity troponin I (hsTnI) values and significant  changes across serial measurements may suggest ACS but many other  chronic and acute conditions are known to elevate hsTnI results.  Refer to the "Links" section for chest pain algorithms and additional  guidance. Performed at [facility], [address] [place], [place] [address]   Magnesium     Status: None   Collection Time: 10/27/22 10:26 PM  Result Value Ref Range   Magnesium 2.1 1.7 - 2.4 mg/dL    Comment: Performed at [facility], [address], [place], [place] [address]  Urinalysis, Routine w reflex microscopic -Urine, Clean Catch     Status: Abnormal   Collection Time: 10/28/22  3:18 AM  Result Value Ref Range   Color, Urine AMBER (A) YELLOW    Comment: BIOCHEMICALS MAY  BE AFFECTED BY COLOR   APPearance CLEAR CLEAR   Specific Gravity, Urine 1.024 1.005 - 1.030   pH 5.0 5.0 -  8.0   Glucose, UA NEGATIVE NEGATIVE mg/dL   Hgb urine dipstick NEGATIVE NEGATIVE   Bilirubin Urine SMALL (A) NEGATIVE   Ketones, ur 5 (A) NEGATIVE mg/dL   Protein, ur 30 (A) NEGATIVE mg/dL   Nitrite NEGATIVE NEGATIVE   Leukocytes,Ua NEGATIVE NEGATIVE   RBC / HPF 0-5 0 - 5 RBC/hpf   WBC, UA 6-10 0 - 5 WBC/hpf   Bacteria, UA NONE SEEN NONE SEEN   Squamous Epithelial / HPF 0-5 0 - 5 /HPF   Mucus PRESENT    Hyaline Casts, UA PRESENT     Comment: Performed at [facility], [address], [place], [place] [address]  Comprehensive metabolic panel     Status: Abnormal   Collection Time: 10/28/22  4:25 AM  Result Value Ref Range   Sodium 134 (L) 135 - 145 mmol/L   Potassium 4.0 3.5 - 5.1 mmol/L   Chloride 98 98 - 111 mmol/L   CO2 25 22 - 32 mmol/L   Glucose, Bld 107 (H) 70 - 99 mg/dL    Comment: Glucose reference range applies only to samples taken after fasting for at least 8 hours.   BUN 50 (H) 8 - 23 mg/dL   Creatinine, Ser 7.42 (H) 0.61 - 1.24 mg/dL   Calcium 9.5 8.9 - 59.5 mg/dL   Total Protein 6.1 (L) 6.5 - 8.1 g/dL   Albumin 3.0 (L) 3.5 - 5.0 g/dL   AST 87 (H) 15 - 41 U/L   ALT 135 (H) 0 - 44 U/L   Alkaline Phosphatase 104 38 - 126 U/L   Total Bilirubin 1.4 (H) 0.3 - 1.2 mg/dL   GFR, Estimated 34 (L) >60 mL/min    Comment: (NOTE) Calculated using the CKD-EPI Creatinine Equation (2021)    Anion gap 11 5 - 15    Comment: Performed at [facility], [address], [place], [place] [address]  CBC     Status: Abnormal   Collection Time: 10/28/22  4:25 AM  Result Value Ref Range   WBC 11.5 (H) 4.0 - 10.5 K/uL   RBC 3.63 (L) 4.22 - 5.81 MIL/uL   Hemoglobin 10.9 (L) 13.0 - 17.0 g/dL   HCT 64.3 (L) 32.9 - 51.8 %   MCV 90.1 80.0 - 100.0 fL   MCH 30.0 26.0 - 34.0 pg   MCHC 33.3 30.0 - 36.0 g/dL   RDW 84.1 66.0 - 63.0 %   Platelets 251 150 - 400 K/uL   nRBC  0.0 0.0 - 0.2 %    Comment: Performed at [facility], [address] [place], [place] [address]  Protime-INR     Status: Abnormal   Collection Time: 10/28/22  4:25 AM  Result Value Ref Range   Prothrombin Time 17.3 (H) 11.4 - 15.2 seconds   INR 1.4 (H) 0.8 - 1.2    Comment: (NOTE) INR goal varies based on device and disease states. Performed at [facility], [address] [place], [place] [address]   Lactic acid, plasma     Status: None   Collection Time: 10/28/22  4:25 AM  Result Value Ref Range   Lactic Acid, Venous 1.3 0.5 - 1.9 mmol/L    Comment: Performed at [facility], [address] [place], [place] [address]   US Abdomen Limited RUQ (LIVER/GB)  Result Date: 10/28/2022 CLINICAL DATA:  221910 with elevated liver enzymes. [id number] with right upper quadrant pain. EXAM: ULTRASOUND ABDOMEN LIMITED RIGHT UPPER  QUADRANT COMPARISON:  CT without contrast earlier today. FINDINGS: Gallbladder: There is intermediate density sludge almost completely filling the gallbladder lumen. No shadowing stones are seen and no free wall thickening. There was no positive sonographic Murphy's sign or pericholecystic fluid. Common bile duct: Diameter: Within normal limits measuring 2.7 mm with no intrahepatic biliary prominence. Liver: No focal lesion identified. There is mild generalized increased parenchymal echogenicity consistent with steatosis. Portal vein is patent on color Doppler imaging with normal direction of blood flow towards the liver. Other: None. IMPRESSION: 1. Intermediate density sludge almost completely filling the gallbladder lumen. No shadowing stones are seen and there is no sonographic evidence of acute cholecystitis. 2. Normal caliber bile ducts. 3. Mild hepatic steatosis. Electronically Signed   By: Almira Bar M.D.   On: 10/28/2022 06:35   CT ABDOMEN PELVIS WO CONTRAST  Result Date: 10/28/2022 CLINICAL DATA:  Acute abdominal pain, found down EXAM: CT ABDOMEN AND  PELVIS WITHOUT CONTRAST TECHNIQUE: Multidetector CT imaging of the abdomen and pelvis was performed following the standard protocol without IV contrast. Unenhanced CT was performed per clinician order. Lack of IV contrast limits sensitivity and specificity, especially for evaluation of abdominal/pelvic solid viscera. RADIATION DOSE REDUCTION: This exam was performed according to the departmental dose-optimization program which includes automated exposure control, adjustment of the mA and/or kV according to patient size and/or use of iterative reconstruction technique. COMPARISON:  10/20/2022 FINDINGS: Lower chest: No acute pleural or parenchymal lung disease. Hepatobiliary: High attenuation material within the gallbladder may reflect vicarious excretion of previously administered contrast versus gallbladder sludge. No calcified gallstones or cholecystitis. Unremarkable unenhanced appearance of the liver. Pancreas: Unremarkable unenhanced appearance. Spleen: Unremarkable unenhanced appearance. Adrenals/Urinary Tract: No urinary tract calculi or obstructive uropathy within either kidney. The adrenals and bladder are unremarkable. Stomach/Bowel: No bowel obstruction or ileus. Normal appendix right lower quadrant. No bowel wall thickening or inflammatory change. Vascular/Lymphatic: Aortic atherosclerosis. No enlarged abdominal or pelvic lymph nodes. Reproductive: Stable enlargement of the prostate. No focal abnormality. Other: No free fluid or free intraperitoneal gas. No abdominal wall hernia. Musculoskeletal: No acute or destructive bony abnormalities. Postsurgical changes lower lumbar spine. Reconstructed images demonstrate no additional findings. IMPRESSION: 1. High attenuation material filling the gallbladder lumen, likely vicarious excretion of previously administered contrast. Gallbladder sludge could also give this appearance. No evidence of cholelithiasis or cholecystitis. 2. Otherwise unremarkable unenhanced  CT of the abdomen and pelvis. Electronically Signed   By: Sharlet Salina M.D.   On: 10/28/2022 03:22   CT HEAD WO CONTRAST ( )  Result Date: 10/28/2022 CLINICAL DATA:  Syncope EXAM: CT HEAD WITHOUT CONTRAST TECHNIQUE: Contiguous axial images were obtained from the base of the skull through the vertex without intravenous contrast. RADIATION DOSE REDUCTION: This exam was performed according to the departmental dose-optimization program which includes automated exposure control, adjustment of the mA and/or kV according to patient size and/or use of iterative reconstruction technique. COMPARISON:  No prior CT head available, correlation is made with MRI head 10/01/2017 FINDINGS: Brain: No evidence of acute infarction, hemorrhage, mass, mass effect, or midline shift. No hydrocephalus or extra-axial fluid collection. Gray-white differentiation is preserved. The basilar cisterns are patent. Normal cerebral volume for age. Vascular: No hyperdense vessel. Skull: Negative for fracture or focal lesion. Sinuses/Orbits: No acute finding. Other: The mastoid air cells are well aerated. IMPRESSION: No acute intracranial process. Electronically Signed   By: Wiliam Ke M.D.   On: 10/28/2022 03:18    Pending Labs Unresulted Labs (From admission, onward)  Start     Ordered   10/28/22 0500  Comprehensive metabolic panel  Daily,   R      10/28/22 0418   10/28/22 0500  CBC  Daily,   R      10/28/22 0418   10/28/22 0417  Sodium, urine, random  Add-on,   AD        10/28/22 0418   10/28/22 0417  Creatinine, urine, random  Add-on,   AD        10/28/22 0418   10/28/22 0416  HIV Antibody (routine testing w rflx)  (HIV Antibody (Routine testing w reflex) panel)  Once,   R        10/28/22 0418   10/27/22 2306  C Difficile Quick Screen w PCR reflex  (C Difficile quick screen w PCR reflex panel )  Once, for 24 hours,   URGENT       References:    CDiff Information Tool   10/27/22 2305             Vitals/Pain Today's Vitals   10/28/22 0530 10/28/22 0600 10/28/22 0617 10/28/22 0630  BP: 107/76 130/80  117/68  Pulse: 71 73  71  Resp: 18 (!) 22  13  Temp:   97.9 F (36.6 C)   TempSrc:   Oral   SpO2: 100% 100%  100%  Weight:      Height:      PainSc:        Isolation Precautions Enteric precautions (UV disinfection)  Medications Medications  heparin injection 5,000 Units (has no administration in time range)  sodium chloride flush (NS) 0.9 % injection 3 mL (has no administration in time range)  0.9 %  sodium chloride infusion ( Intravenous New Bag/Given 10/28/22 0452)  ondansetron (ZOFRAN) tablet 4 mg ( Oral See Alternative 10/28/22 0453)    Or  ondansetron (ZOFRAN) injection 4 mg (4 mg Intravenous Given 10/28/22 0453)  sodium chloride 0.9 % bolus 1,000 mL (0 mLs Intravenous Stopped 10/28/22 0218)    Mobility walks     Focused Assessments Cardiac Assessment Handoff:  Cardiac Rhythm: Normal sinus rhythm Lab Results  Component Value Date   CKTOTAL 294 10/27/2022   No results found for: "DDIMER" Does the Patient currently have chest pain? No    R Recommendations: See Admitting Provider Note  Report given to:   Additional Notes: .

## 2022-10-28 NOTE — Progress Notes (Signed)
Patient received to unit alert and oriented x4. Per patient, he is independent at baseline. Nurse x2 cleaned patient as he was found to have had an incontinent episode of stool. Patient reports no pain and does not voice any questions or concerns at this time. Patient made aware of how and when to call for assistance-verbalized understanding. Bed alarm initiated, bed lowered, and call bell within reach.

## 2022-10-28 NOTE — ED Provider Notes (Signed)
[place] EMERGENCY DEPARTMENT AT [facility] Provider Note   CSN: [id number] Arrival date & time: 10/27/22  2218     History  Chief Complaint  Patient presents with   SYNCOPE    Brandon [name] is a 72 y.o. male.  Patient presents to the emergency department for evaluation of generalized weakness, abdominal pain, syncope.  Patient reports that he had dental surgery 5 weeks ago.  He has been on antibiotics on and off.  He has been able to eat or drink because he reports that he has had a bad taste in his mouth ever since the surgery.  He apparently fell yesterday and has been on the floor, unable to get up ever since.  Family member found him on the floor, tried to stand him up but he would become diaphoretic, dizzy and blacked out when he stood up.  Patient has not had any vomiting.  He was told by one of the dentist that the infection may be "in his stomach" and that he "needed to get that out".  He has been taking MiraLAX and having loose stools.       Home Medications Prior to Admission medications   Medication Brandon Start Date End Date Taking? Authorizing Provider  losartan (COZAAR) 50 MG tablet Take 1 tablet by mouth once daily 06/07/22   Donita Brooks, MD  ondansetron (ZOFRAN-ODT) 8 MG disintegrating tablet Take 1 tablet (8 mg total) by mouth every 8 (eight) hours as needed for nausea or vomiting. 10/20/22   Dione Booze, MD  polyethylene glycol (MIRALAX / GLYCOLAX) 17 g packet Take 17 g by mouth daily. 10/20/22   Dione Booze, MD  pramipexole (MIRAPEX) 0.25 MG tablet Take 1 tablet (0.25 mg total) by mouth at bedtime. 01/08/22   Donita Brooks, MD      Allergies    Patient has no known allergies.    Review of Systems   Review of Systems  Physical Exam Updated Vital Signs BP 117/68   Pulse 71   Temp 97.9 F (36.6 C) (Oral)   Resp 13   Ht 6\' 4"  (1.93 m)   Wt 89.3 kg   SpO2 100%   BMI 23.96 kg/m  Physical Exam Vitals and nursing note reviewed.   Constitutional:      General: He is not in acute distress.    Appearance: He is well-developed.  HENT:     Head: Normocephalic and atraumatic.     Mouth/Throat:     Mouth: Mucous membranes are moist.  Eyes:     General: Vision grossly intact. Gaze aligned appropriately.     Extraocular Movements: Extraocular movements intact.     Conjunctiva/sclera: Conjunctivae normal.  Cardiovascular:     Rate and Rhythm: Regular rhythm. Tachycardia present.     Pulses: Normal pulses.     Heart sounds: Normal heart sounds, S1 normal and S2 normal. No murmur heard.    No friction rub. No gallop.  Pulmonary:     Effort: Pulmonary effort is normal. No respiratory distress.     Breath sounds: Normal breath sounds.  Abdominal:     Palpations: Abdomen is soft.     Tenderness: There is generalized abdominal tenderness. There is no guarding or rebound.     Hernia: No hernia is present.  Musculoskeletal:        General: No swelling.     Cervical back: Full passive range of motion without pain, normal range of motion and neck supple. No  pain with movement, spinous process tenderness or muscular tenderness. Normal range of motion.     Right lower leg: No edema.     Left lower leg: No edema.  Skin:    General: Skin is warm and dry.     Capillary Refill: Capillary refill takes less than 2 seconds.     Findings: No ecchymosis, erythema, lesion or wound.  Neurological:     Mental Status: He is alert and oriented to person, place, and time.     GCS: GCS eye subscore is 4. GCS verbal subscore is 5. GCS motor subscore is 6.     Cranial Nerves: Cranial nerves 2-12 are intact.     Sensory: Sensation is intact.     Motor: Motor function is intact. No weakness or abnormal muscle tone.     Coordination: Coordination is intact.  Psychiatric:        Mood and Affect: Mood normal.        Speech: Speech normal.        Behavior: Behavior normal.     ED Results / Procedures / Treatments   Labs (all labs ordered  are listed, but only abnormal results are displayed) Labs Reviewed  CBC WITH DIFFERENTIAL/PLATELET - Abnormal; Notable for the following components:      Result Value   WBC 11.6 (*)    RBC 4.01 (*)    Hemoglobin 11.6 (*)    HCT 35.9 (*)    Neutro Abs 8.6 (*)    All other components within normal limits  COMPREHENSIVE METABOLIC PANEL - Abnormal; Notable for the following components:   Sodium 132 (*)    Chloride 95 (*)    Glucose, Bld 128 (*)    BUN 51 (*)    Creatinine, Ser 2.46 (*)    Total Protein 6.3 (*)    Albumin 3.2 (*)    AST 92 (*)    ALT 142 (*)    Total Bilirubin 1.4 (*)    GFR, Estimated 28 (*)    All other components within normal limits  LACTIC ACID, PLASMA - Abnormal; Notable for the following components:   Lactic Acid, Venous 2.4 (*)    All other components within normal limits  URINALYSIS, ROUTINE W REFLEX MICROSCOPIC - Abnormal; Notable for the following components:   Color, Urine AMBER (*)    Bilirubin Urine SMALL (*)    Ketones, ur 5 (*)    Protein, ur 30 (*)    All other components within normal limits  COMPREHENSIVE METABOLIC PANEL - Abnormal; Notable for the following components:   Sodium 134 (*)    Glucose, Bld 107 (*)    BUN 50 (*)    Creatinine, Ser 2.05 (*)    Total Protein 6.1 (*)    Albumin 3.0 (*)    AST 87 (*)    ALT 135 (*)    Total Bilirubin 1.4 (*)    GFR, Estimated 34 (*)    All other components within normal limits  CBC - Abnormal; Notable for the following components:   WBC 11.5 (*)    RBC 3.63 (*)    Hemoglobin 10.9 (*)    HCT 32.7 (*)    All other components within normal limits  PROTIME-INR - Abnormal; Notable for the following components:   Prothrombin Time 17.3 (*)    INR 1.4 (*)    All other components within normal limits  TROPONIN I (HIGH SENSITIVITY) - Abnormal; Notable for the following components:   Troponin  I (High Sensitivity) 20 (*)    All other components within normal limits  C DIFFICILE QUICK SCREEN W PCR  REFLEX    CK  MAGNESIUM  LACTIC ACID, PLASMA  HIV ANTIBODY (ROUTINE TESTING W REFLEX)  SODIUM, URINE, RANDOM  CREATININE, URINE, RANDOM    EKG EKG Interpretation  Date/Time:  Sunday October 27 2022 22:30:06 EDT Ventricular Rate:  73 PR Interval:  154 QRS Duration: 94 QT Interval:  427 QTC Calculation: 471 R Axis:   -17 Text Interpretation: Sinus rhythm Borderline left axis deviation Confirmed by Eber Hong ([id number]) on 10/27/2022 10:38:52 PM  Radiology CT ABDOMEN PELVIS WO CONTRAST  Result Date: 10/28/2022 CLINICAL DATA:  Acute abdominal pain, found down EXAM: CT ABDOMEN AND PELVIS WITHOUT CONTRAST TECHNIQUE: Multidetector CT imaging of the abdomen and pelvis was performed following the standard protocol without IV contrast. Unenhanced CT was performed per clinician order. Lack of IV contrast limits sensitivity and specificity, especially for evaluation of abdominal/pelvic solid viscera. RADIATION DOSE REDUCTION: This exam was performed according to the departmental dose-optimization program which includes automated exposure control, adjustment of the mA and/or kV according to patient size and/or use of iterative reconstruction technique. COMPARISON:  10/20/2022 FINDINGS: Lower chest: No acute pleural or parenchymal lung disease. Hepatobiliary: High attenuation material within the gallbladder may reflect vicarious excretion of previously administered contrast versus gallbladder sludge. No calcified gallstones or cholecystitis. Unremarkable unenhanced appearance of the liver. Pancreas: Unremarkable unenhanced appearance. Spleen: Unremarkable unenhanced appearance. Adrenals/Urinary Tract: No urinary tract calculi or obstructive uropathy within either kidney. The adrenals and bladder are unremarkable. Stomach/Bowel: No bowel obstruction or ileus. Normal appendix right lower quadrant. No bowel wall thickening or inflammatory change. Vascular/Lymphatic: Aortic atherosclerosis. No enlarged abdominal or  pelvic lymph nodes. Reproductive: Stable enlargement of the prostate. No focal abnormality. Other: No free fluid or free intraperitoneal gas. No abdominal wall hernia. Musculoskeletal: No acute or destructive bony abnormalities. Postsurgical changes lower lumbar spine. Reconstructed images demonstrate no additional findings. IMPRESSION: 1. High attenuation material filling the gallbladder lumen, likely vicarious excretion of previously administered contrast. Gallbladder sludge could also give this appearance. No evidence of cholelithiasis or cholecystitis. 2. Otherwise unremarkable unenhanced CT of the abdomen and pelvis. Electronically Signed   By: Sharlet Salina M.D.   On: 10/28/2022 03:22   CT HEAD WO CONTRAST ( )  Result Date: 10/28/2022 CLINICAL DATA:  Syncope EXAM: CT HEAD WITHOUT CONTRAST TECHNIQUE: Contiguous axial images were obtained from the base of the skull through the vertex without intravenous contrast. RADIATION DOSE REDUCTION: This exam was performed according to the departmental dose-optimization program which includes automated exposure control, adjustment of the mA and/or kV according to patient size and/or use of iterative reconstruction technique. COMPARISON:  No prior CT head available, correlation is made with MRI head 10/01/2017 FINDINGS: Brain: No evidence of acute infarction, hemorrhage, mass, mass effect, or midline shift. No hydrocephalus or extra-axial fluid collection. Gray-white differentiation is preserved. The basilar cisterns are patent. Normal cerebral volume for age. Vascular: No hyperdense vessel. Skull: Negative for fracture or focal lesion. Sinuses/Orbits: No acute finding. Other: The mastoid air cells are well aerated. IMPRESSION: No acute intracranial process. Electronically Signed   By: Wiliam Ke M.D.   On: 10/28/2022 03:18    Procedures Procedures    Medications Ordered in ED Medications  heparin injection 5,000 Units (has no administration in time range)   sodium chloride flush (NS) 0.9 % injection 3 mL (has no administration in time range)  0.9 %  sodium  chloride infusion ( Intravenous New Bag/Given 10/28/22 0452)  ondansetron (ZOFRAN) tablet 4 mg ( Oral See Alternative 10/28/22 0453)    Or  ondansetron (ZOFRAN) injection 4 mg (4 mg Intravenous Given 10/28/22 0453)  sodium chloride 0.9 % bolus 1,000 mL (0 mLs Intravenous Stopped 10/28/22 0218)    ED Course/ Medical Decision Making/ A&P                             Medical Decision Making Amount and/or Complexity of Data Reviewed Labs: ordered. Decision-making details documented in ED Course. Radiology: ordered and independent interpretation performed. Decision-making details documented in ED Course. ECG/medicine tests: ordered and independent interpretation performed. Decision-making details documented in ED Course.  Risk Decision regarding hospitalization.   Presents to the emergency department for syncopal episode and generalized weakness.  Patient reportedly fell at home and could not get up, thinks he might have been on the ground for up to 1 day.   Patient found to have an acute kidney injury.  Electrolytes are otherwise unremarkable.  He does appear to be dehydrated.  No evidence of rhabdomyolysis.  Patient complaining of abdominal discomfort.  CT scan does not show any acute abnormality.  CT head unremarkable.  Patient has been on antibiotics secondary to his recent dental surgery.  He has been having loose stools, however has been using MiraLAX for what he has felt was constipation.  He has not been able to give a stool sample for C. difficile at this point.  C. difficile is felt to be unlikely.  Patient will be observed by hospitalist for acute kidney injury.        Final Clinical Impression(s) / ED Diagnoses Final diagnoses:  AKI (acute kidney injury) (HCC)  Dehydration    Rx / DC Orders ED Discharge Orders     None         Iesha Summerhill, Canary Brim, MD 10/28/22  [phone]

## 2022-10-28 NOTE — H&P (Signed)
History and Physical    SANATH SANE ZOX:[id number] DOB: 1951-04-18 DOA: 10/27/2022  PCP: Donita Brooks, MD   Patient coming from: Home   Chief Complaint: General weakness, syncope   HPI: Brandon [name] is a pleasant 72 y.o. male with medical history significant for hypertension and syphilis, now presenting to the emergency department with generalized weakness and multiple syncopal episodes.  Patient reports that he had dental work performed several weeks ago, was not eating or drinking due to oral pain, and has since gone on to develop loss of appetite and states that food does not taste right.  He has become progressively weak in general and has been experiencing lightheadedness upon standing.  He notes that his usual bilateral lower extremity edema is no longer present.    Patient was found on his bathroom floor at home, suffered a brief loss of consciousness while family was trying to help him up, and then had another syncopal episode when EMS was assisting him up.  He denies any recent chest pain or palpitations, denies focal numbness or weakness, and denies vomiting or diarrhea.  He was given IV fluids prior to arrival in the ED.  ED Course: Upon arrival to the ED, patient is found to be afebrile and saturating well on room air with normal heart rate and systolic blood pressure of 95 and greater.  ED workup is most notable for BUN 51, creatinine 2.46, AST 92, ALT 142, lactic acid 2.4, negative head CT, and no obstructive uropathy on CT abdomen and pelvis.  He was given a liter of normal saline in the ED.  Review of Systems:  All other systems reviewed and apart from HPI, are negative.  Past Medical History:  Diagnosis Date   Hypertension    Syphilis     Past Surgical History:  Procedure Laterality Date   BACK SURGERY     4 yrs ago   COLONOSCOPY  01/22/2012   Procedure: COLONOSCOPY;  Surgeon: Malissa Hippo, MD;  Location: [facility];  Service: Endoscopy;   Laterality: N/A;  200    Social History:   reports that he has been smoking cigarettes. He has a 22.50 pack-year smoking history. He has never used smokeless tobacco. He reports that he does not drink alcohol and does not use drugs.  No Known Allergies  Family History  Problem Relation Age of Onset   Diabetes Mother    Hypertension Mother    Breast cancer Mother    Alcohol abuse Father    Aneurysm Father    Breast cancer Daughter    Colon cancer Neg Hx      Prior to Admission medications   Medication Sig Start Date End Date Taking? Authorizing Provider  losartan (COZAAR) 50 MG tablet Take 1 tablet by mouth once daily 06/07/22   Donita Brooks, MD  ondansetron (ZOFRAN-ODT) 8 MG disintegrating tablet Take 1 tablet (8 mg total) by mouth every 8 (eight) hours as needed for nausea or vomiting. 10/20/22   Dione Booze, MD  polyethylene glycol (MIRALAX / GLYCOLAX) 17 g packet Take 17 g by mouth daily. 10/20/22   Dione Booze, MD  pramipexole (MIRAPEX) 0.25 MG tablet Take 1 tablet (0.25 mg total) by mouth at bedtime. 01/08/22   Donita Brooks, MD    Physical Exam: Vitals:   10/28/22 0320 10/28/22 0330 10/28/22 0400 10/28/22 0430  BP: 122/76 (!) 133/98 120/74 128/69  Pulse: 85 63 74 69  Resp: (!) 21 (!) 28 20  18  Temp:      TempSrc:      SpO2: 100% 100% 100% 100%  Weight:      Height:        Constitutional: NAD, calm  Eyes: PERTLA, lids and conjunctivae normal ENMT: Mucous membranes are dry. Posterior pharynx clear of any exudate or lesions.   Neck: supple, no masses  Respiratory: no wheezing, no crackles. No accessory muscle use.  Cardiovascular: S1 & S2 heard, regular rate and rhythm. No extremity edema.   Abdomen: Soft, no distension, tender in RUQ without rebound pain or guarding. Bowel sounds active.  Musculoskeletal: no clubbing / cyanosis. No joint deformity upper and lower extremities.   Skin: no significant rashes, lesions, ulcers. Warm, dry,  well-perfused. Neurologic: CN 2-12 grossly intact. Moving all extremities. Alert and oriented.  Psychiatric: Pleasant. Cooperative.    Labs and Imaging on Admission: I have personally reviewed following labs and imaging studies  CBC: Recent Labs  Lab 10/27/22 2226 10/28/22 0425  WBC 11.6* 11.5*  NEUTROABS 8.6*  --   HGB 11.6* 10.9*  HCT 35.9* 32.7*  MCV 89.5 90.1  PLT 260 251   Basic Metabolic Panel: Recent Labs  Lab 10/27/22 2226  NA 132*  K 4.0  CL 95*  CO2 22  GLUCOSE 128*  BUN 51*  CREATININE 2.46*  CALCIUM 10.0  MG 2.1   GFR: Estimated Creatinine Clearance: 34.8 mL/min (A) (by C-G formula based on SCr of 2.46 mg/dL (H)). Liver Function Tests: Recent Labs  Lab 10/27/22 2226  AST 92*  ALT 142*  ALKPHOS 108  BILITOT 1.4*  PROT 6.3*  ALBUMIN 3.2*   No results for input(s): "LIPASE", "AMYLASE" in the last 168 hours. No results for input(s): "AMMONIA" in the last 168 hours. Coagulation Profile: Recent Labs  Lab 10/28/22 0425  INR 1.4*   Cardiac Enzymes: Recent Labs  Lab 10/27/22 2226  CKTOTAL 294   BNP (last 3 results) No results for input(s): "PROBNP" in the last 8760 hours. HbA1C: No results for input(s): "HGBA1C" in the last 72 hours. CBG: No results for input(s): "GLUCAP" in the last 168 hours. Lipid Profile: No results for input(s): "CHOL", "HDL", "LDLCALC", "TRIG", "CHOLHDL", "LDLDIRECT" in the last 72 hours. Thyroid Function Tests: No results for input(s): "TSH", "T4TOTAL", "FREET4", "T3FREE", "THYROIDAB" in the last 72 hours. Anemia Panel: No results for input(s): "VITAMINB12", "FOLATE", "FERRITIN", "TIBC", "IRON", "RETICCTPCT" in the last 72 hours. Urine analysis:    Component Value Date/Time   COLORURINE AMBER (A) 10/28/2022 0318   APPEARANCEUR CLEAR 10/28/2022 0318   LABSPEC 1.024 10/28/2022 0318   PHURINE 5.0 10/28/2022 0318   GLUCOSEU NEGATIVE 10/28/2022 0318   HGBUR NEGATIVE 10/28/2022 0318   BILIRUBINUR SMALL (A)  10/28/2022 0318   KETONESUR 5 (A) 10/28/2022 0318   PROTEINUR 30 (A) 10/28/2022 0318   NITRITE NEGATIVE 10/28/2022 0318   LEUKOCYTESUR NEGATIVE 10/28/2022 0318   Sepsis Labs: @LABRCNTIP (procalcitonin:4,lacticidven:4) )No results found for this or any previous visit (from the past 240 hour(s)).   Radiological Exams on Admission: CT ABDOMEN PELVIS WO CONTRAST  Result Date: 10/28/2022 CLINICAL DATA:  Acute abdominal pain, found down EXAM: CT ABDOMEN AND PELVIS WITHOUT CONTRAST TECHNIQUE: Multidetector CT imaging of the abdomen and pelvis was performed following the standard protocol without IV contrast. Unenhanced CT was performed per clinician order. Lack of IV contrast limits sensitivity and specificity, especially for evaluation of abdominal/pelvic solid viscera. RADIATION DOSE REDUCTION: This exam was performed according to the departmental dose-optimization program which includes automated  exposure control, adjustment of the mA and/or kV according to patient size and/or use of iterative reconstruction technique. COMPARISON:  10/20/2022 FINDINGS: Lower chest: No acute pleural or parenchymal lung disease. Hepatobiliary: High attenuation material within the gallbladder may reflect vicarious excretion of previously administered contrast versus gallbladder sludge. No calcified gallstones or cholecystitis. Unremarkable unenhanced appearance of the liver. Pancreas: Unremarkable unenhanced appearance. Spleen: Unremarkable unenhanced appearance. Adrenals/Urinary Tract: No urinary tract calculi or obstructive uropathy within either kidney. The adrenals and bladder are unremarkable. Stomach/Bowel: No bowel obstruction or ileus. Normal appendix right lower quadrant. No bowel wall thickening or inflammatory change. Vascular/Lymphatic: Aortic atherosclerosis. No enlarged abdominal or pelvic lymph nodes. Reproductive: Stable enlargement of the prostate. No focal abnormality. Other: No free fluid or free  intraperitoneal gas. No abdominal wall hernia. Musculoskeletal: No acute or destructive bony abnormalities. Postsurgical changes lower lumbar spine. Reconstructed images demonstrate no additional findings. IMPRESSION: 1. High attenuation material filling the gallbladder lumen, likely vicarious excretion of previously administered contrast. Gallbladder sludge could also give this appearance. No evidence of cholelithiasis or cholecystitis. 2. Otherwise unremarkable unenhanced CT of the abdomen and pelvis. Electronically Signed   By: Sharlet Salina M.D.   On: 10/28/2022 03:22   CT HEAD WO CONTRAST ( )  Result Date: 10/28/2022 CLINICAL DATA:  Syncope EXAM: CT HEAD WITHOUT CONTRAST TECHNIQUE: Contiguous axial images were obtained from the base of the skull through the vertex without intravenous contrast. RADIATION DOSE REDUCTION: This exam was performed according to the departmental dose-optimization program which includes automated exposure control, adjustment of the mA and/or kV according to patient size and/or use of iterative reconstruction technique. COMPARISON:  No prior CT head available, correlation is made with MRI head 10/01/2017 FINDINGS: Brain: No evidence of acute infarction, hemorrhage, mass, mass effect, or midline shift. No hydrocephalus or extra-axial fluid collection. Gray-white differentiation is preserved. The basilar cisterns are patent. Normal cerebral volume for age. Vascular: No hyperdense vessel. Skull: Negative for fracture or focal lesion. Sinuses/Orbits: No acute finding. Other: The mastoid air cells are well aerated. IMPRESSION: No acute intracranial process. Electronically Signed   By: Wiliam Ke M.D.   On: 10/28/2022 03:18    EKG: Independently reviewed. Sinus rhythm.   Assessment/Plan  1. AKI  - SCr is 2.46 on admission, up from baseline of ~1 - No obstructive uropathy on CT in ED  - Likely prerenal in setting of recent anorexia, in addition to continued ARB use   - He  was given 1 liter NS in ED  - Continue IVF hydration, hold losartan, renally-dose medications, repeat chem panel   2. Syncope  - He has been lightheaded on standing and had multiple syncopal episodes last night while trying to get up  - Most likely d/t intravascular volume depletion and orthostatic hypotension  - Continue cardiac monitoring, check orthostatic vitals, check echocardiogram    3. Elevated LFTs  - AST is 92 and ALT 142 on admission with t bili 1.4 and normal alkaline phosphatase  - Check RUQ [place], trend LFTs    4. Hypertension  - Hold losartan in light of AKI, treat as-needed only for now     DVT prophylaxis: sq heparin  Code Status: Full  Level of Care: Level of care: Telemetry Medical Family Communication: none present  Disposition Plan:  Patient is from: home  Anticipated d/c is to: TBD Anticipated d/c date is: 10/30/24  Patient currently: Pending orthostatic vitals, echocardiogram, cardiac monitoring, improved/stable renal function, may need PT eval   Consults called:  None  Admission status: Inpatient     Briscoe Deutscher, MD [facility]  10/28/2022, 5:03 AM

## 2022-10-28 NOTE — Plan of Care (Signed)
Pt seen and examined, resting comfortably.   Admitted for dehydration, AKI, syncope. - fluids - echo - recheck labs in AM

## 2022-10-28 NOTE — ED Notes (Signed)
Urine collected for UA, noted to be red and to have a strong odor.

## 2022-10-29 DIAGNOSIS — N179 Acute kidney failure, unspecified: Secondary | ICD-10-CM | POA: Diagnosis not present

## 2022-10-29 LAB — COMPREHENSIVE METABOLIC PANEL
ALT: 119 U/L — ABNORMAL HIGH (ref 0–44)
AST: 79 U/L — ABNORMAL HIGH (ref 15–41)
Albumin: 2.8 g/dL — ABNORMAL LOW (ref 3.5–5.0)
Alkaline Phosphatase: 91 U/L (ref 38–126)
Anion gap: 7 (ref 5–15)
BUN: 29 mg/dL — ABNORMAL HIGH (ref 8–23)
CO2: 20 mmol/L — ABNORMAL LOW (ref 22–32)
Calcium: 9.1 mg/dL (ref 8.9–10.3)
Chloride: 105 mmol/L (ref 98–111)
Creatinine, Ser: 1.09 mg/dL (ref 0.61–1.24)
GFR, Estimated: >60 mL/min (ref >60)
Glucose, Bld: 87 mg/dL (ref 70–99)
Potassium: 3.6 mmol/L (ref 3.5–5.1)
Sodium: 132 mmol/L — ABNORMAL LOW (ref 135–145)
Total Bilirubin: 1.4 mg/dL — ABNORMAL HIGH (ref 0.3–1.2)
Total Protein: 5.5 g/dL — ABNORMAL LOW (ref 6.5–8.1)

## 2022-10-29 LAB — CBC
HCT: 29.4 % — ABNORMAL LOW (ref 39.0–52.0)
Hemoglobin: 9.7 g/dL — ABNORMAL LOW (ref 13.0–17.0)
MCH: 29.1 pg (ref 26.0–34.0)
MCHC: 33 g/dL (ref 30.0–36.0)
MCV: 88.3 fL (ref 80.0–100.0)
Platelets: 222 10*3/uL (ref 150–400)
RBC: 3.33 MIL/uL — ABNORMAL LOW (ref 4.22–5.81)
RDW: 12.6 % (ref 11.5–15.5)
WBC: 6.5 10*3/uL (ref 4.0–10.5)
nRBC: 0 % (ref 0.0–0.2)

## 2022-10-29 LAB — LIPASE, BLOOD: Lipase: 42 U/L (ref 11–51)

## 2022-10-29 LAB — CK: Total CK: 155 U/L (ref 49–397)

## 2022-10-29 MED ORDER — SODIUM CHLORIDE 0.9 % IV SOLN: INTRAVENOUS | Status: DC

## 2022-10-29 NOTE — Progress Notes (Signed)
Per radiology, patient's scan will be delayed until 6/5 at 11am. Provider made aware-confirmed understanding.

## 2022-10-29 NOTE — Progress Notes (Signed)
Initial Nutrition Assessment  DOCUMENTATION CODES:   Not applicable  INTERVENTION:  - Add Ensure Enlive po BID, each supplement provides 350 kcal and 20 grams of protein.  - Add renal MVI q day.   NUTRITION DIAGNOSIS:   Inadequate oral intake related to poor appetite as evidenced by per patient/family report.  GOAL:   Patient will meet greater than or equal to 90% of their needs  MONITOR:   PO intake, Supplement acceptance  REASON FOR ASSESSMENT:   Malnutrition Screening Tool    ASSESSMENT:   72 y.o. male admits related to general weakness and syncope. PMH includes: HTN and syphilis. Pt is currently receiving medical management related to AKI.  Meds reviewed. Labs reviewed: Na low, BUN high.   The pt was out of room at time of assessment. Per record, pt has experienced an 11% wt loss in the past 2 months, which is significant. RD will add Ensure BID for now. Will continue to closely monitor PO intakes.   NUTRITION - FOCUSED PHYSICAL EXAM:  Unable to assess, attempt at follow up.   Diet Order:   Diet Order             DIET DYS 3 Room service appropriate? Yes; Fluid consistency: Thin  Diet effective now                   EDUCATION NEEDS:   Not appropriate for education at this time  Skin:  Skin Assessment: Reviewed RN Assessment  Last BM:  6/3 - type 7  Height:   Ht Readings from Last 1 Encounters:  10/27/22 6\' 4"  (1.93 m)    Weight:   Wt Readings from Last 1 Encounters:  10/29/22 89.3 kg    Ideal Body Weight:     BMI:  Body mass index is 23.96 kg/m.  Estimated Nutritional Needs:   Kcal:  6962-9528 kcals  Protein:  110-130 gm  Fluid:  >/= 2.2 L  Bethann Humble, RD, LDN, CNSC.

## 2022-10-29 NOTE — Progress Notes (Signed)
PROGRESS NOTE   Brandon [name]  ZOX:[id number] DOB: 1950-07-29 DOA: 10/27/2022 PCP: Donita Brooks, MD  Brief Narrative:  72 year old black male-recent dental surgery 4 weeks ago Prior history of syphilis Known underlying lumbar degenerative disc disease L3-4, L4-L5, L5-S1 by Dr. Dutch Quint back in 2009 Known hypertension, restless leg syndrome  Presented to [facility] emergency room 10/20/2022 with lower abdominal pain unable to eat drink because his gums were swollen and painful denied nausea vomiting endorsed decreased urine/stool output--at that visit DDx was?  Concerning for constipation no acute process LFTs were elevated (new since 09/20/2022) lipase was normal-patient was given fluids morphine Zofran and CT scan showed no acute process  he returned to the emergency room 6/3 unable to eat "bad taste in mouth" had been found on the floor for 2 days was diaphoretic dizzy and "blacked out"   BUNs/creatinine 51/2.4 AST/ALT 92/142 lactic acid 2.4 CT head negative no obstructive uropathy CT however some mention of exuberant contrast in gallbladder   HIDA scan ordered 6/4   Hospital-Problem based course   volume depletion AKI on admission Interrelated-unclear if he is also having an "GI bleed"--- it appears like his kidney function is rapidly improving CK level is negative, however continue fluid hydration--continue NS 100 cc/H  Hemoccult pending-nursing aware Discontinue forever Losartan---stop laxatives   acute GI bleed? Patient tells me he has had a colonoscopy--- review of records = external and internal hemorrhoids 01/22/2012 Dr. Karilyn Cota If this is a GI bleed and is probably very slow 1 but we will recheck labs in the morning Had Hemoccult recently 08/30/2022 in office which was negative If he continues to have black stool, if Hemoccult is positive, then we will consult unassigned for GI if this turns out to be positive   elevated LFTs  on exam he does not have right upper quadrant  tenderness rather he has lower quadrant tenderness specifically in the left lower quadrant HIDA scan cannot be accomplished today--await this being done  Previous lumbar degenerative disc disease  Previously treated for syphilis  DVT prophylaxis: Heparin Code Status: Full Family Communication: None present Disposition:  Status is: Inpatient Remains inpatient appropriate because:   Requires further workup  Subjective: Awake coherent no distress tells me he was having "pus" coming out of his his mouth He looks well now does not have any pain fever He still feels pretty weak did not want to drink or eat with nursing staff earlier this morning  Objective: Vitals:   10/28/22 1934 10/29/22 0453 10/29/22 0500 10/29/22 0807  BP: 107/68 (!) 158/84  126/66  Pulse: 80 64  64  Resp: 18 18    Temp: 97.6 F (36.4 C) 98.2 F (36.8 C)    TempSrc:  Oral    SpO2: 100% 100%  100%  Weight:   89.3 kg   Height:        Intake/Output Summary (Last 24 hours) at 10/29/2022 1351 Last data filed at 10/29/2022 0730 Gross per 24 hour  Intake --  Output 575 ml  Net -575 ml   Filed Weights   10/27/22 2231 10/29/22 0500  Weight: 89.3 kg 89.3 kg    Examination:  EOMI NCAT I examined his upper jaw I do not notice any bruising or anything along those lines and he does have several removed teeth Neck is soft supple Chest is clear no wheeze Abdomen is soft no rebound  Data Reviewed: personally reviewed   CBC    Component Value Date/Time   WBC  6.5 10/29/2022 0727   RBC 3.33 (L) 10/29/2022 0727   HGB 9.7 (L) 10/29/2022 0727   HCT 29.4 (L) 10/29/2022 0727   PLT 222 10/29/2022 0727   MCV 88.3 10/29/2022 0727   MCH 29.1 10/29/2022 0727   MCHC 33.0 10/29/2022 0727   RDW 12.6 10/29/2022 0727   LYMPHSABS 2.0 10/27/2022 2226   MONOABS 1.0 10/27/2022 2226   EOSABS 0.0 10/27/2022 2226   BASOSABS 0.0 10/27/2022 2226      Latest Ref Rng & Units 10/29/2022    7:27 AM 10/28/2022    4:25 AM 10/27/2022    10:26 PM  CMP  Glucose 70 - 99 mg/dL 87  213  086   BUN 8 - 23 mg/dL 29  50  51   Creatinine 0.61 - 1.24 mg/dL 5.78  4.69  6.29   Sodium 135 - 145 mmol/L 132  134  132   Potassium 3.5 - 5.1 mmol/L 3.6  4.0  4.0   Chloride 98 - 111 mmol/L 105  98  95   CO2 22 - 32 mmol/L 20  25  22    Calcium 8.9 - 10.3 mg/dL 9.1  9.5  52.8   Total Protein 6.5 - 8.1 g/dL 5.5  6.1  6.3   Total Bilirubin 0.3 - 1.2 mg/dL 1.4  1.4  1.4   Alkaline Phos 38 - 126 U/L 91  104  108   AST 15 - 41 U/L 79  87  92   ALT 0 - 44 U/L 119  135  142      Radiology Studies: ECHOCARDIOGRAM COMPLETE  Result Date: 10/28/2022    ECHOCARDIOGRAM REPORT   Patient Name:   Brandon [name] Date of Exam: 10/28/2022 Medical Rec #:  [id number]           Height:       76.0 in Accession #:    [id number]          Weight:       196.9 lb Date of Birth:  [birth year]           BSA:          2.200 m Patient Age:    71 years            BP:           104/63 mmHg Patient Gender: M                   HR:           68 bpm. Exam Location:  Inpatient Procedure: 2D Echo, Cardiac Doppler and Color Doppler Indications:    Syncope  History:        Patient has no prior history of Echocardiogram examinations.                 Signs/Symptoms:Syncope; Risk Factors:Hypertension.  Sonographer:    Darlys Gales Referring Phys: [id number] TIMOTHY S OPYD IMPRESSIONS  1. Left ventricular ejection fraction, by estimation, is 65 to 70%. The left ventricle has normal function. The left ventricle has no regional wall motion abnormalities. There is mild concentric left ventricular hypertrophy. Left ventricular diastolic parameters are consistent with Grade I diastolic dysfunction (impaired relaxation).  2. Right ventricular systolic function is normal. The right ventricular size is normal.  3. The mitral valve is normal in structure. Trivial mitral valve regurgitation. No evidence of mitral stenosis.  4. The aortic valve is normal in structure. Aortic valve regurgitation is not  visualized. No  aortic stenosis is present.  5. The inferior vena cava is normal in size with greater than 50% respiratory variability, suggesting right atrial pressure of 3 mmHg.  6. Technically limited study due to poor sound wave transmission. FINDINGS  Left Ventricle: Left ventricular ejection fraction, by estimation, is 65 to 70%. The left ventricle has normal function. The left ventricle has no regional wall motion abnormalities. The left ventricular internal cavity size was normal in size. There is  mild concentric left ventricular hypertrophy. Left ventricular diastolic parameters are consistent with Grade I diastolic dysfunction (impaired relaxation). Right Ventricle: The right ventricular size is normal. No increase in right ventricular wall thickness. Right ventricular systolic function is normal. Left Atrium: Left atrial size was normal in size. Right Atrium: Right atrial size was normal in size. Pericardium: There is no evidence of pericardial effusion. Mitral Valve: The mitral valve is normal in structure. Trivial mitral valve regurgitation. No evidence of mitral valve stenosis. Tricuspid Valve: The tricuspid valve is normal in structure. Tricuspid valve regurgitation is trivial. No evidence of tricuspid stenosis. Aortic Valve: The aortic valve is normal in structure. Aortic valve regurgitation is not visualized. No aortic stenosis is present. Aortic valve mean gradient measures 3.0 mmHg. Aortic valve peak gradient measures 5.7 mmHg. Pulmonic Valve: The pulmonic valve was normal in structure. Pulmonic valve regurgitation is trivial. No evidence of pulmonic stenosis. Aorta: The aortic root is normal in size and structure. Venous: The inferior vena cava is normal in size with greater than 50% respiratory variability, suggesting right atrial pressure of 3 mmHg. IAS/Shunts: No atrial level shunt detected by color flow Doppler.  LEFT VENTRICLE PLAX 2D LVIDd:         3.80 cm Diastology LVIDs:         2.70 cm  LV e' medial:    6.53 cm/s LV PW:         1.30 cm LV E/e' medial:  8.6 LV IVS:        1.20 cm LV e' lateral:   10.48 cm/s                        LV E/e' lateral: 5.4  RIGHT VENTRICLE RV S prime:     12.70 cm/s TAPSE (M-mode): 1.6 cm LEFT ATRIUM           Index        RIGHT ATRIUM           Index LA Vol (A4C): 43.4 ml 19.72 ml/m  RA Area:     11.70 cm                                    RA Volume:   23.90 ml  10.86 ml/m  AORTIC VALVE AV Vmax:           119.00 cm/s AV Vmean:          82.700 cm/s AV VTI:            0.176 m AV Peak Grad:      5.7 mmHg AV Mean Grad:      3.0 mmHg LVOT Vmax:         91.00 cm/s LVOT Vmean:        69.300 cm/s LVOT VTI:          0.182 m LVOT/AV VTI ratio: 1.03  AORTA Ao Root diam: 3.30 cm Ao Asc  diam:  3.70 cm MITRAL VALVE               TRICUSPID VALVE MV Area (PHT): 2.13 cm    TR Peak grad:   28.1 mmHg MV Decel Time: 356 msec    TR Vmax:        265.00 cm/s MV E velocity: 56.10 cm/s MV A velocity: 77.60 cm/s  SHUNTS MV E/A ratio:  0.72        Systemic VTI: 0.18 m Arvilla Meres MD Electronically signed by Arvilla Meres MD Signature Date/Time: 10/28/2022/1:00:12 PM    Final    US Abdomen Limited RUQ (LIVER/GB)  Result Date: 10/28/2022 CLINICAL DATA:  [id number] with elevated liver enzymes. [id number] with right upper quadrant pain. EXAM: ULTRASOUND ABDOMEN LIMITED RIGHT UPPER QUADRANT COMPARISON:  CT without contrast earlier today. FINDINGS: Gallbladder: There is intermediate density sludge almost completely filling the gallbladder lumen. No shadowing stones are seen and no free wall thickening. There was no positive sonographic Murphy's sign or pericholecystic fluid. Common bile duct: Diameter: Within normal limits measuring 2.7 mm with no intrahepatic biliary prominence. Liver: No focal lesion identified. There is mild generalized increased parenchymal echogenicity consistent with steatosis. Portal vein is patent on color Doppler imaging with normal direction of blood flow towards the  liver. Other: None. IMPRESSION: 1. Intermediate density sludge almost completely filling the gallbladder lumen. No shadowing stones are seen and there is no sonographic evidence of acute cholecystitis. 2. Normal caliber bile ducts. 3. Mild hepatic steatosis. Electronically Signed   By: Almira Bar M.D.   On: 10/28/2022 06:35   CT ABDOMEN PELVIS WO CONTRAST  Result Date: 10/28/2022 CLINICAL DATA:  Acute abdominal pain, found down EXAM: CT ABDOMEN AND PELVIS WITHOUT CONTRAST TECHNIQUE: Multidetector CT imaging of the abdomen and pelvis was performed following the standard protocol without IV contrast. Unenhanced CT was performed per clinician order. Lack of IV contrast limits sensitivity and specificity, especially for evaluation of abdominal/pelvic solid viscera. RADIATION DOSE REDUCTION: This exam was performed according to the departmental dose-optimization program which includes automated exposure control, adjustment of the mA and/or kV according to patient size and/or use of iterative reconstruction technique. COMPARISON:  10/20/2022 FINDINGS: Lower chest: No acute pleural or parenchymal lung disease. Hepatobiliary: High attenuation material within the gallbladder may reflect vicarious excretion of previously administered contrast versus gallbladder sludge. No calcified gallstones or cholecystitis. Unremarkable unenhanced appearance of the liver. Pancreas: Unremarkable unenhanced appearance. Spleen: Unremarkable unenhanced appearance. Adrenals/Urinary Tract: No urinary tract calculi or obstructive uropathy within either kidney. The adrenals and bladder are unremarkable. Stomach/Bowel: No bowel obstruction or ileus. Normal appendix right lower quadrant. No bowel wall thickening or inflammatory change. Vascular/Lymphatic: Aortic atherosclerosis. No enlarged abdominal or pelvic lymph nodes. Reproductive: Stable enlargement of the prostate. No focal abnormality. Other: No free fluid or free intraperitoneal gas.  No abdominal wall hernia. Musculoskeletal: No acute or destructive bony abnormalities. Postsurgical changes lower lumbar spine. Reconstructed images demonstrate no additional findings. IMPRESSION: 1. High attenuation material filling the gallbladder lumen, likely vicarious excretion of previously administered contrast. Gallbladder sludge could also give this appearance. No evidence of cholelithiasis or cholecystitis. 2. Otherwise unremarkable unenhanced CT of the abdomen and pelvis. Electronically Signed   By: Sharlet Salina M.D.   On: 10/28/2022 03:22   CT HEAD WO CONTRAST ( )  Result Date: 10/28/2022 CLINICAL DATA:  Syncope EXAM: CT HEAD WITHOUT CONTRAST TECHNIQUE: Contiguous axial images were obtained from the base of the skull through the vertex without intravenous contrast. RADIATION  DOSE REDUCTION: This exam was performed according to the departmental dose-optimization program which includes automated exposure control, adjustment of the mA and/or kV according to patient size and/or use of iterative reconstruction technique. COMPARISON:  No prior CT head available, correlation is made with MRI head 10/01/2017 FINDINGS: Brain: No evidence of acute infarction, hemorrhage, mass, mass effect, or midline shift. No hydrocephalus or extra-axial fluid collection. Gray-white differentiation is preserved. The basilar cisterns are patent. Normal cerebral volume for age. Vascular: No hyperdense vessel. Skull: Negative for fracture or focal lesion. Sinuses/Orbits: No acute finding. Other: The mastoid air cells are well aerated. IMPRESSION: No acute intracranial process. Electronically Signed   By: Wiliam Ke M.D.   On: 10/28/2022 03:18     Scheduled Meds:  heparin  5,000 Units Subcutaneous Q8H   sodium chloride flush  3 mL Intravenous Q12H   Continuous Infusions:  sodium chloride 100 mL/hr at 10/29/22 1350     LOS: 1 day   Time spent: 45  Rhetta Mura, MD [facility] To contact the  attending provider between 7A-7P or the covering provider during after hours 7P-7A, please log into the web site [email or web address] and access using universal [place] password for that web site. If you do not have the password, please call the hospital operator.  10/29/2022, 1:51 PM

## 2022-10-30 ENCOUNTER — Inpatient Hospital Stay (HOSPITAL_COMMUNITY): Payer: Medicare HMO

## 2022-10-30 DIAGNOSIS — N179 Acute kidney failure, unspecified: Secondary | ICD-10-CM | POA: Diagnosis not present

## 2022-10-30 DIAGNOSIS — I951 Orthostatic hypotension: Secondary | ICD-10-CM

## 2022-10-30 DIAGNOSIS — E876 Hypokalemia: Secondary | ICD-10-CM

## 2022-10-30 DIAGNOSIS — D649 Anemia, unspecified: Secondary | ICD-10-CM

## 2022-10-30 DIAGNOSIS — R7989 Other specified abnormal findings of blood chemistry: Secondary | ICD-10-CM | POA: Diagnosis not present

## 2022-10-30 LAB — CBC
HCT: 25.4 % — ABNORMAL LOW (ref 39.0–52.0)
Hemoglobin: 8.4 g/dL — ABNORMAL LOW (ref 13.0–17.0)
MCH: 29.5 pg (ref 26.0–34.0)
MCHC: 33.1 g/dL (ref 30.0–36.0)
MCV: 89.1 fL (ref 80.0–100.0)
Platelets: 216 10*3/uL (ref 150–400)
RBC: 2.85 MIL/uL — ABNORMAL LOW (ref 4.22–5.81)
RDW: 12.6 % (ref 11.5–15.5)
WBC: 5.8 10*3/uL (ref 4.0–10.5)
nRBC: 0 % (ref 0.0–0.2)

## 2022-10-30 LAB — COMPREHENSIVE METABOLIC PANEL
ALT: 103 U/L — ABNORMAL HIGH (ref 0–44)
AST: 63 U/L — ABNORMAL HIGH (ref 15–41)
Albumin: 2.5 g/dL — ABNORMAL LOW (ref 3.5–5.0)
Alkaline Phosphatase: 84 U/L (ref 38–126)
Anion gap: 10 (ref 5–15)
BUN: 20 mg/dL (ref 8–23)
CO2: 20 mmol/L — ABNORMAL LOW (ref 22–32)
Calcium: 9 mg/dL (ref 8.9–10.3)
Chloride: 102 mmol/L (ref 98–111)
Creatinine, Ser: 1.03 mg/dL (ref 0.61–1.24)
GFR, Estimated: >60 mL/min (ref >60)
Glucose, Bld: 73 mg/dL (ref 70–99)
Potassium: 3.4 mmol/L — ABNORMAL LOW (ref 3.5–5.1)
Sodium: 132 mmol/L — ABNORMAL LOW (ref 135–145)
Total Bilirubin: 0.9 mg/dL (ref 0.3–1.2)
Total Protein: 5.2 g/dL — ABNORMAL LOW (ref 6.5–8.1)

## 2022-10-30 LAB — FOLATE: Folate: 13 ng/mL (ref >5.9)

## 2022-10-30 LAB — IRON AND TIBC
Iron: 37 ug/dL — ABNORMAL LOW (ref 45–182)
Saturation Ratios: 23 % (ref 17.9–39.5)
TIBC: 164 ug/dL — ABNORMAL LOW (ref 250–450)
UIBC: 127 ug/dL

## 2022-10-30 LAB — SODIUM, URINE, RANDOM: Sodium, Ur: 79 mmol/L

## 2022-10-30 LAB — FERRITIN: Ferritin: 444 ng/mL — ABNORMAL HIGH (ref 24–336)

## 2022-10-30 LAB — CREATININE, URINE, RANDOM: Creatinine, Urine: 104 mg/dL

## 2022-10-30 LAB — VITAMIN B12: Vitamin B-12: 467 pg/mL (ref 180–914)

## 2022-10-30 MED ORDER — MORPHINE SULFATE (PF) 4 MG/ML IV SOLN
3.0000 mg | Freq: Once | INTRAVENOUS | Status: AC
  Filled 2022-10-30: qty 1

## 2022-10-30 MED ORDER — TECHNETIUM TC 99M MEBROFENIN IV KIT
5.4000 | PACK | Freq: Once | INTRAVENOUS | Status: AC | PRN
  Administered 2022-10-30: 5.4 via INTRAVENOUS

## 2022-10-30 MED ORDER — POTASSIUM CHLORIDE CRYS ER 20 MEQ PO TBCR
40.0000 meq | EXTENDED_RELEASE_TABLET | Freq: Once | ORAL | Status: AC
  Administered 2022-10-30: 40 meq via ORAL
  Filled 2022-10-30: qty 2

## 2022-10-30 MED ORDER — RENA-VITE PO TABS
1.0000 | ORAL_TABLET | Freq: Every day | ORAL | Status: DC
  Administered 2022-10-30 – 2022-11-02 (×4): 1 via ORAL
  Filled 2022-10-30 (×4): qty 1

## 2022-10-30 MED ORDER — ENOXAPARIN SODIUM 40 MG/0.4ML IJ SOSY
40.0000 mg | PREFILLED_SYRINGE | INTRAMUSCULAR | Status: DC
  Administered 2022-10-30 – 2022-11-02 (×4): 40 mg via SUBCUTANEOUS
  Filled 2022-10-30 (×4): qty 0.4

## 2022-10-30 MED ORDER — ENSURE ENLIVE PO LIQD
237.0000 mL | Freq: Two times a day (BID) | ORAL | Status: DC
  Administered 2022-10-31 – 2022-11-03 (×6): 237 mL via ORAL
  Filled 2022-10-30: qty 237

## 2022-10-30 NOTE — Progress Notes (Addendum)
PROGRESS NOTE    Brandon [name]  ZOX:[id number] DOB: January 23, 1951 DOA: 10/27/2022 PCP: Donita Brooks, MD    Chief Complaint  Patient presents with   SYNCOPE    Brief Narrative:  Patient 72 year old gentleman history of recent surgery dental 4 weeks prior to admission, prior history of syphilis, known underlying lumbar DDD L3-4, L4-5, L5-S1 per Dr. Dutch Quint back in 2009, hypertension, RLS presented to [facility], ED 10/20/2022 with lower abdominal pain unable to eat or drink because his gums were swollen and painful, denied nausea or vomiting endorses decreased urine/stool output.  Concern for constipation no acute process LFTs elevated new since 09/20/2022, lipase was normal patient given IV fluids Zofran CT scan negative for any acute process. -Patient presented back to ED 6/3 unable to eat, bad taste in mouth and being found on the floor for 2 days was diaphoretic dizzy and blacked out.  On admission BUN/creatinine 51/2.4, AST ALT 92/142, lactic acid level of 2.4. -CT head negative no obstructive uropathy, CT abdomen pelvis with mention of exuberant contrast in the gallbladder. -HIDA scan done 10/29/2022 with low EF and gallbladder approximately 11%.   Assessment & Plan:   Principal Problem:   AKI (acute kidney injury) (HCC) Active Problems:   Hypertension   Syncope   Elevated LFTs   Anemia   Hypokalemia   Orthostatic hypotension   #1 acute kidney injury, POA -Likely secondary to a prerenal azotemia secondary to poor oral intake, patient noted to be orthostatic on admission. -Urinalysis done nitrite negative leukocytes negative, 30 protein, specific gravity 1.034. -Urine sodium was 79, urine creatinine of 104. -Renal function improved with hydration. -Hydration with IV fluids.  2.  Anemia -??  Etiology. -??  Dilutional. -Hemoglobin currently at 8.4 from 11.6 on admission. -FOBT pending, will reorder. -Anemia panel consistent with anemia of chronic disease, iron of 37,  TIBC of 164, ferritin of 444, folate of 13.0. -Per Dr. Mahala Menghini patient stated had a colonoscopy, review of records showed external and internal hemorrhoids 01/22/2012 by Dr. Jackelyn Knife. -If further drop in hemoglobin and patient does endorse melanotic stools or Hemoccult is positive we will consult with GI for further evaluation and management.  3.  Transaminitis -??  Etiology. -Patient with no significant right upper quadrant tenderness to palpation, however does complain of decreased appetite with poor oral intake. -LFTs slowly trending down. -Right upper quadrant ultrasound with intermediate density sludge almost completely filling the gallbladder lumen, no shadowing stones are seen no sonographic evidence of acute cholecystitis, normal caliber bile ducts, mild hepatic steatosis. -CT abdomen and pelvis with high attenuation material filling gallbladder lumen, likely very carious excretion of previously administered contrast, gallbladder sludge could also give this appearance, no evidence of cholelithiasis or cholecystitis otherwise unremarkable enhanced CT of abdomen and pelvis. -HIDA scan done with patent cystic duct and common bile duct, delayed filling of gallbladder could indicate chronic cholecystitis, no gallbladder EF 11%. -Will need to discuss with general surgery on further workup inpatient versus outpatient.  4.  Previous lumbar degenerative disc disease -Stable.  5.  Hypokalemia -Replete.  6.  Previously treated syphilis  7.  Orthostatic hypotension -Improving with hydration. -IV fluids.   DVT prophylaxis: Heparin>>>>> Lovenox Code Status: Full Family Communication: Updated patient.  No family at bedside. Disposition: Likely home when clinically improved.  Status is: Inpatient Remains inpatient appropriate because: Severity of illness   Consultants:  None  Procedures:  HIDA scan 10/30/2022 CT head 10/28/2022 CT abdomen and pelvis 10/28/2022 Right upper quadrant  ultrasound  10/28/2022 2D echo 10/28/2022  Antimicrobials:  Anti-infectives (From admission, onward)    None         Subjective: In bed.  No chest pain.  No shortness of breath.  No abdominal pain.  States has no appetite.  States has lost a lot of weight.  Awaiting scan to be done today and currently NPO.  Patient denies any diarrhea.  Objective: Vitals:   10/29/22 1950 10/30/22 0354 10/30/22 0748 10/30/22 1423  BP: 134/77 (!) 152/80 136/75 126/72  Pulse: 60 67 60 60  Resp: 18 18 16 18   Temp: 98.6 F (37 C) 98.7 F (37.1 C) 98.5 F (36.9 C) 97.9 F (36.6 C)  TempSrc:  Oral  Oral  SpO2: 100% 100% 99% 100%  Weight:      Height:        Intake/Output Summary (Last 24 hours) at 10/30/2022 1533 Last data filed at 10/30/2022 1528 Gross per 24 hour  Intake 1337.79 ml  Output 1080 ml  Net 257.79 ml   Filed Weights   10/27/22 2231 10/29/22 0500  Weight: 89.3 kg 89.3 kg    Examination:  General exam: Appears calm and comfortable  Respiratory system: Clear to auscultation.  No wheezes, no crackles, no rhonchi.  Fair air movement.  Speaking in full sentences.  Respiratory effort normal. Cardiovascular system: S1 & S2 heard, RRR. No JVD, murmurs, rubs, gallops or clicks. No pedal edema. Gastrointestinal system: Abdomen is nondistended, soft and nontender. No organomegaly or masses felt. Normal bowel sounds heard. Central nervous system: Alert and oriented. No focal neurological deficits. Extremities: Symmetric 5 x 5 power. Skin: No rashes, lesions or ulcers Psychiatry: Judgement and insight appear normal. Mood & affect appropriate.     Data Reviewed: I have personally reviewed following labs and imaging studies  CBC: Recent Labs  Lab 10/27/22 2226 10/28/22 0425 10/29/22 0727 10/30/22 0806  WBC 11.6* 11.5* 6.5 5.8  NEUTROABS 8.6*  --   --   --   HGB 11.6* 10.9* 9.7* 8.4*  HCT 35.9* 32.7* 29.4* 25.4*  MCV 89.5 90.1 88.3 89.1  PLT 260 251 222 216    Basic Metabolic  Panel: Recent Labs  Lab 10/27/22 2226 10/28/22 0425 10/29/22 0727 10/30/22 0806  NA 132* 134* 132* 132*  K 4.0 4.0 3.6 3.4*  CL 95* 98 105 102  CO2 22 25 20* 20*  GLUCOSE 128* 107* 87 73  BUN 51* 50* 29* 20  CREATININE 2.46* 2.05* 1.09 1.03  CALCIUM 10.0 9.5 9.1 9.0  MG 2.1  --   --   --     GFR: Estimated Creatinine Clearance: 83.1 mL/min (by C-G formula based on SCr of 1.03 mg/dL).  Liver Function Tests: Recent Labs  Lab 10/27/22 2226 10/28/22 0425 10/29/22 0727 10/30/22 0806  AST 92* 87* 79* 63*  ALT 142* 135* 119* 103*  ALKPHOS 108 104 91 84  BILITOT 1.4* 1.4* 1.4* 0.9  PROT 6.3* 6.1* 5.5* 5.2*  ALBUMIN 3.2* 3.0* 2.8* 2.5*    CBG: No results for input(s): "GLUCAP" in the last 168 hours.   No results found for this or any previous visit (from the past 240 hour(s)).       Radiology Studies: NM Hepato W/EF  Result Date: 10/30/2022 CLINICAL DATA:  RIGHT upper quadrant pain. Concern for acalculous cholecystitis. EXAM: NUCLEAR MEDICINE HEPATOBILIARY IMAGING WITH GALLBLADDER EF TECHNIQUE: Sequential images of the abdomen were obtained out to 60 minutes following intravenous administration of radiopharmaceutical. After oral ingestion of  Ensure, gallbladder ejection fraction was determined. At 60 min, normal ejection fraction is greater than 33%. RADIOPHARMACEUTICALS:  5.4 mCi Tc-47m  Choletec IV COMPARISON:  None Available. FINDINGS: Prompt clearance radiotracer from blood pool and homogeneous uptake in liver. Gallbladder not visualized at 60 minutes. Gallbladder begins to fill at 75 minutes. Administration of fatty meal demonstrates poor contraction of the gallbladder. Calculated gallbladder ejection fraction is 11%. (Normal gallbladder ejection fraction with Ensure is greater than 33%.) IMPRESSION: 1. Patent cystic duct and common bile duct. 2. Delayed filling of the gallbladder could indicate chronic cholecystitis. 3. Low gallbladder ejection fraction (11%).  Electronically Signed   By: Genevive Bi M.D.   On: 10/30/2022 14:33        Scheduled Meds:  enoxaparin (LOVENOX) injection  40 mg Subcutaneous Q24H   potassium chloride  40 mEq Oral Once   sodium chloride flush  3 mL Intravenous Q12H   Continuous Infusions:  sodium chloride 100 mL/hr at 10/30/22 0536     LOS: 2 days    Time spent: 35 minutes    Ramiro Harvest, MD [facility]   To contact the attending provider between 7A-7P or the covering provider during after hours 7P-7A, please log into the web site [email or web address] and access using universal [place] password for that web site. If you do not have the password, please call the hospital operator.  10/30/2022, 3:33 PM

## 2022-10-31 DIAGNOSIS — E860 Dehydration: Secondary | ICD-10-CM

## 2022-10-31 DIAGNOSIS — D509 Iron deficiency anemia, unspecified: Secondary | ICD-10-CM | POA: Diagnosis not present

## 2022-10-31 DIAGNOSIS — R7989 Other specified abnormal findings of blood chemistry: Secondary | ICD-10-CM | POA: Diagnosis not present

## 2022-10-31 DIAGNOSIS — N179 Acute kidney failure, unspecified: Secondary | ICD-10-CM | POA: Diagnosis not present

## 2022-10-31 LAB — CBC
HCT: 24.4 % — ABNORMAL LOW (ref 39.0–52.0)
Hemoglobin: 8 g/dL — ABNORMAL LOW (ref 13.0–17.0)
MCH: 29.1 pg (ref 26.0–34.0)
MCHC: 32.8 g/dL (ref 30.0–36.0)
MCV: 88.7 fL (ref 80.0–100.0)
Platelets: 222 10*3/uL (ref 150–400)
RBC: 2.75 MIL/uL — ABNORMAL LOW (ref 4.22–5.81)
RDW: 12.6 % (ref 11.5–15.5)
WBC: 5.8 10*3/uL (ref 4.0–10.5)
nRBC: 0 % (ref 0.0–0.2)

## 2022-10-31 LAB — COMPREHENSIVE METABOLIC PANEL
ALT: 96 U/L — ABNORMAL HIGH (ref 0–44)
AST: 56 U/L — ABNORMAL HIGH (ref 15–41)
Albumin: 2.4 g/dL — ABNORMAL LOW (ref 3.5–5.0)
Alkaline Phosphatase: 86 U/L (ref 38–126)
Anion gap: 6 (ref 5–15)
BUN: 16 mg/dL (ref 8–23)
CO2: 22 mmol/L (ref 22–32)
Calcium: 8.9 mg/dL (ref 8.9–10.3)
Chloride: 107 mmol/L (ref 98–111)
Creatinine, Ser: 0.95 mg/dL (ref 0.61–1.24)
GFR, Estimated: >60 mL/min (ref >60)
Glucose, Bld: 68 mg/dL — ABNORMAL LOW (ref 70–99)
Potassium: 3.4 mmol/L — ABNORMAL LOW (ref 3.5–5.1)
Sodium: 135 mmol/L (ref 135–145)
Total Bilirubin: 0.7 mg/dL (ref 0.3–1.2)
Total Protein: 4.9 g/dL — ABNORMAL LOW (ref 6.5–8.1)

## 2022-10-31 LAB — MAGNESIUM: Magnesium: 1.7 mg/dL (ref 1.7–2.4)

## 2022-10-31 LAB — PHOSPHORUS: Phosphorus: 2.6 mg/dL (ref 2.5–4.6)

## 2022-10-31 MED ORDER — MEGESTROL ACETATE 400 MG/10ML PO SUSP
400.0000 mg | Freq: Every day | ORAL | Status: DC
  Administered 2022-10-31 – 2022-11-03 (×4): 400 mg via ORAL
  Filled 2022-10-31 (×4): qty 10

## 2022-10-31 MED ORDER — MAGNESIUM SULFATE 2 GM/50ML IV SOLN
2.0000 g | Freq: Once | INTRAVENOUS | Status: AC
  Administered 2022-10-31: 2 g via INTRAVENOUS
  Filled 2022-10-31: qty 50

## 2022-10-31 MED ORDER — POTASSIUM CHLORIDE CRYS ER 20 MEQ PO TBCR
40.0000 meq | EXTENDED_RELEASE_TABLET | Freq: Once | ORAL | Status: AC
  Administered 2022-10-31: 40 meq via ORAL
  Filled 2022-10-31: qty 2

## 2022-10-31 NOTE — Evaluation (Addendum)
Occupational Therapy Evaluation Patient Details Name: Brandon [name] MRN: [id number] DOB: [birth year] Today's Date: 10/31/2022   History of Present Illness Pt is a 72 y.o. male admitted 6/2 with dx of AKI. He presented to the ED after multi syncopal episodes at home. 5 weeks PTA pt had infected teeth extracted at dentist followed by progressive loss of appetite, weakness, weight loss, and bad taste in his mouth. 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 Pt presents now with deficits in strength and progressive dizziness in standing requiring up to Min A for basic transfers and LB ADLs to decrease fall risk. Pt expresses that poor appetite, dizziness + syncopal episodes and significant weight loss has occurred since dental procedure to remove infected teeth. Pending improvements in dizziness, anticipate no OT needs at DC but will continue to follow acutely to ensure progress towards ADL/mobility independence.   BP sitting: 130/76 BP standing: 122/80 unable to stand 3 min for additional BP BP once back to sitting: 139/78     Recommendations for follow up therapy are one component of a multi-disciplinary discharge planning process, led by the attending physician.  Recommendations may be updated based on patient status, additional functional criteria and insurance authorization.   Assistance Recommended at Discharge Intermittent Supervision/Assistance  Patient can return home with the following Assistance with cooking/housework;Assist for transportation;Help with stairs or ramp for entrance;A little help with bathing/dressing/bathroom    Functional Status Assessment  Patient has had a recent decline in their functional status and demonstrates the ability to make significant improvements in function in a reasonable and predictable amount of time.  Equipment Recommendations  Other  (comment) (TBD pending progress)    Recommendations for Other Services       Precautions / Restrictions Precautions Precautions: Fall;Other (comment) Precaution Comments: watch BP (+orthostatics in ED) Restrictions Weight Bearing Restrictions: No      Mobility Bed Mobility               General bed mobility comments: in chair    Transfers Overall transfer level: Needs assistance Equipment used: None Transfers: Sit to/from Stand Sit to Stand: Min guard           General transfer comment: for safety; mild sway in standing w/ dizziness      Balance Overall balance assessment: Mild deficits observed, not formally tested                                         ADL either performed or assessed with clinical judgement   ADL Overall ADL's : Needs assistance/impaired Eating/Feeding: Independent;Sitting   Grooming: Set up;Sitting   Upper Body Bathing: Set up;Sitting   Lower Body Bathing: Minimal assistance;Sit to/from stand   Upper Body Dressing : Set up;Sitting   Lower Body Dressing: Minimal assistance;Sit to/from stand   Toilet Transfer: Minimal assistance;Stand-pivot;BSC/3in1   Toileting- Clothing Manipulation and Hygiene: Minimal assistance;Sit to/from stand;Sitting/lateral lean         General ADL Comments: Limited by progressive dizziness in standing (not correlated with changes in BP). Pt reports feeling sick since dental sx, frustrated to have complications and no answers yet     Vision Baseline Vision/History: 0 No visual deficits Ability to See in Adequate Light: 0 Adequate Patient Visual Report: No change from baseline Vision Assessment?:  No apparent visual deficits     Perception     Praxis      Pertinent Vitals/Pain Pain Assessment Pain Assessment: No/denies pain     Hand Dominance Right   Extremity/Trunk Assessment Upper Extremity Assessment Upper Extremity Assessment: Overall WFL for tasks assessed   Lower  Extremity Assessment Lower Extremity Assessment: Defer to PT evaluation   Cervical / Trunk Assessment Cervical / Trunk Assessment: Normal   Communication Communication Communication: No difficulties   Cognition Arousal/Alertness: Awake/alert Behavior During Therapy: WFL for tasks assessed/performed Overall Cognitive Status: Within Functional Limits for tasks assessed                                       General Comments  Orthostatic BP: supine 118/71, sitting 135/69, standing 147/71. Pt very dizzy in stance. Only able to static stand ~ 30 seconds. Seated rest break, then amb 15' bed to recliner.    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          OT Problem List: Decreased strength;Decreased activity tolerance;Impaired balance (sitting and/or standing);Decreased knowledge of use of DME or AE      OT Treatment/Interventions: Self-care/ADL training;Therapeutic exercise;Energy conservation;DME and/or AE instruction;Therapeutic activities    OT Goals(Current goals can be found in the care plan section) Acute Rehab OT Goals Patient Stated Goal: figure out what is wrong; get back to work OT Goal Formulation: With patient Time For Goal Achievement: 11/14/22 Potential to Achieve Goals: Good  OT Frequency: Min 2X/week    Co-evaluation              AM-PAC OT  "6 Clicks" Daily Activity     Outcome Measure Help from another person eating meals?: None Help from another person taking care of personal grooming?: A Little Help from another person toileting, which includes using toliet, bedpan, or urinal?: A Little Help from another person bathing (including washing, rinsing, drying)?: A Little Help from another person to put on and taking off regular upper body clothing?: A Little Help from another person to put on and taking off regular lower body clothing?: A Little 6 Click Score: 19   End of Session Nurse Communication: Mobility status  Activity Tolerance: Treatment limited secondary to medical complications (Comment) Patient left: in chair;with call bell/phone within reach;with chair alarm set  OT Visit Diagnosis: Unsteadiness on feet (R26.81);Other abnormalities of gait and mobility (R26.89)                Time: 0981-1914 OT Time Calculation (min): 24 min Charges:  OT General Charges $OT Visit: 1 Visit OT Evaluation $OT Eval Moderate Complexity: 1 Mod  Bradd Canary, OTR/L Acute Rehab Services Office: [phone]   Lorre Munroe 10/31/2022, 2:31 PM

## 2022-10-31 NOTE — Evaluation (Signed)
Physical Therapy Evaluation Patient Details Name: Brandon [name] MRN: [id number] DOB: 01/02/1951 Today's Date: 10/31/2022  History of Present Illness  Pt is a 72 y.o. male admitted 6/2 with dx of AKI. He presented to the ED after multi syncopal episodes at home. 5 weeks PTA pt underwent dental sx followed by progressive loss of appetite, weakness, weight loss, and bad taste in his mouth. PMH: lumbar DDD, HTN   Clinical Impression  Pt admitted with above diagnosis. PTA pt lived at home with his brother. 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 Pt currently with functional limitations due to the deficits listed below (see PT Problem List). On eval, pt required supervision bed mobility, min guard assist transfers, and min assist amb 15' without AD. Upward systolic BP trend noted with position change, with pt reporting significant dizziness in sitting/standing. BP: supine 118/71, sitting 135/69, standing 147/71. Significant weakness noted limiting pt's ability to tolerate mobility.  Pt will benefit from acute skilled PT to increase their independence and safety with mobility to allow discharge.          Recommendations for follow up therapy are one component of a multi-disciplinary discharge planning process, led by the attending physician.  Recommendations may be updated based on patient status, additional functional criteria and insurance authorization.  Follow Up Recommendations       Assistance Recommended at Discharge PRN  Patient can return home with the following  Assistance with cooking/housework;Assist for transportation;Help with stairs or ramp for entrance    Equipment Recommendations Other (comment) (to further assess)  Recommendations for Other Services       Functional Status Assessment Patient has had a recent decline in their functional status and demonstrates the ability to make significant improvements in function  in a reasonable and predictable amount of time.     Precautions / Restrictions Precautions Precautions: Fall;Other (comment) Precaution Comments: watch BP (+orthostatics in ED) Restrictions Weight Bearing Restrictions: No      Mobility  Bed Mobility Overal bed mobility: Needs Assistance Bed Mobility: Supine to Sit     Supine to sit: Supervision     General bed mobility comments: for safety    Transfers Overall transfer level: Needs assistance Equipment used: None Transfers: Sit to/from Stand Sit to Stand: Min guard           General transfer comment: for safety    Ambulation/Gait Ambulation/Gait assistance: Min assist Gait Distance (Feet): 15 Feet Assistive device: None Gait Pattern/deviations: Step-through pattern, Decreased stride length Gait velocity: decreased     General Gait Details: Pt with c/o dizziness. Min assist provided due to multiple syncopal episodes PTA. BP stable. Notable fatigue, weakness  Stairs            Wheelchair Mobility    Modified Rankin (Stroke Patients Only)       Balance Overall balance assessment: Mild deficits observed, not formally tested                                           Pertinent Vitals/Pain Pain Assessment Pain Assessment: Faces Faces Pain Scale: Hurts little more Pain Location: IV site RUE Pain Descriptors / Indicators: Discomfort, Grimacing Pain Intervention(s): Other (comment) (notified RN)    Home Living Family/patient expects to be discharged to:: Private residence Living Arrangements: Other relatives (brother) Available Help at Discharge: Family;Available  24 hours/day Type of Home: House Home Access: Stairs to enter Entrance Stairs-Rails: Left;Right;Can reach both Entrance Stairs-Number of Steps: 2   Home Layout: One level Home Equipment: None      Prior Function Prior Level of Function : Independent/Modified Independent;Working/employed;Driving              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       Hand Dominance   Dominant Hand: Right    Extremity/Trunk Assessment   Upper Extremity Assessment Upper Extremity Assessment: Defer to OT evaluation    Lower Extremity Assessment Lower Extremity Assessment: Generalized weakness    Cervical / Trunk Assessment Cervical / Trunk Assessment: Normal  Communication   Communication: No difficulties  Cognition Arousal/Alertness: Awake/alert Behavior During Therapy: WFL for tasks assessed/performed Overall Cognitive Status: Within Functional Limits for tasks assessed                                          General Comments General comments (skin integrity, edema, etc.): Orthostatic BP: supine 118/71, sitting 135/69, standing 147/71. Pt very dizzy in stance. Only able to static stand ~ 30 seconds. Seated rest break, then amb 15' bed to recliner.    Exercises     Assessment/Plan    PT Assessment Patient needs continued PT services  PT Problem List Decreased strength;Decreased mobility;Decreased activity tolerance       PT Treatment Interventions DME instruction;Functional mobility training;Patient/family education;Therapeutic activities;Gait training;Stair training;Therapeutic exercise    PT Goals (Current goals can be found in the Care Plan section)  Acute Rehab PT Goals Patient Stated Goal: regain strength PT Goal Formulation: With patient Time For Goal Achievement: 11/14/22 Potential to Achieve Goals: Good    Frequency Min 3X/week     Co-evaluation               AM-PAC PT "6 Clicks" Mobility  Outcome Measure Help needed turning from your back to your side while in a flat bed without using bedrails?: None Help needed moving from lying on your back to sitting on the side of a flat bed without using bedrails?: A Little Help needed moving to and from a bed to a chair (including a wheelchair)?: A Little Help needed standing up  from a chair using your arms (e.g., wheelchair or bedside chair)?: A Little Help needed to walk in hospital room?: A Little Help needed climbing 3-5 steps with a railing? : A Lot 6 Click Score: 18    End of Session Equipment Utilized During Treatment: Gait belt Activity Tolerance: Patient limited by fatigue;Other (comment) (dizziness) Patient left: in chair;with call bell/phone within reach;with chair alarm set Nurse Communication: Mobility status PT Visit Diagnosis: Muscle weakness (generalized) (M62.81);Difficulty in walking, not elsewhere classified (R26.2)    Time: 1129-1201 PT Time Calculation (min) (ACUTE ONLY): 32 min   Charges:   PT Evaluation $PT Eval Moderate Complexity: 1 Mod PT Treatments $Gait Training: 8-22 mins        Ferd Glassing., PT  Office # [phone]   Ilda Foil 10/31/2022, 12:15 PM

## 2022-10-31 NOTE — Progress Notes (Signed)
PROGRESS NOTE    Brandon [name]  WUJ:[id number] DOB: 09/11/1950 DOA: 10/27/2022 PCP: Donita Brooks, MD    Chief Complaint  Patient presents with   SYNCOPE    Brief Narrative:  Patient 72 year old gentleman history of recent surgery dental 4 weeks prior to admission, prior history of syphilis, known underlying lumbar DDD L3-4, L4-5, L5-S1 per Dr. Dutch Quint back in 2009, hypertension, RLS presented to [facility], ED 10/20/2022 with lower abdominal pain unable to eat or drink because his gums were swollen and painful, denied nausea or vomiting endorses decreased urine/stool output.  Concern for constipation no acute process LFTs elevated new since 09/20/2022, lipase was normal patient given IV fluids Zofran CT scan negative for any acute process. -Patient presented back to ED 6/3 unable to eat, bad taste in mouth and being found on the floor for 2 days was diaphoretic dizzy and blacked out.  On admission BUN/creatinine 51/2.4, AST ALT 92/142, lactic acid level of 2.4. -CT head negative no obstructive uropathy, CT abdomen pelvis with mention of exuberant contrast in the gallbladder. -HIDA scan done 10/29/2022 with low EF and gallbladder approximately 11%.   Assessment & Plan:   Principal Problem:   AKI (acute kidney injury) (HCC) Active Problems:   Hypertension   Syncope   Elevated LFTs   Anemia   Hypokalemia   Orthostatic hypotension   #1 acute kidney injury, POA -Likely secondary to a prerenal azotemia secondary to poor oral intake, patient noted to be orthostatic on admission. -Urinalysis done nitrite negative leukocytes negative, 30 protein, specific gravity 1.034. -Urine sodium was 79, urine creatinine of 104. -Renal function improved with hydration. -IV fluids.  2.  Anemia -??  Etiology. -??  Dilutional. -Hemoglobin currently at 8.0 from 8.4 from 9.7 from 11.6 on admission. -FOBT pending. -Anemia panel consistent with anemia of chronic disease, iron of 37, TIBC of 164,  ferritin of 444, folate of 13.0. -Per Dr. Mahala Menghini patient stated had a colonoscopy, review of records showed external and internal hemorrhoids 01/22/2012 by Dr. Kelvin Cellar. -Patient with no melanotic stools and no bowel movement today.   -Follow H&H.   -If further drop in hemoglobin and patient does endorse melanotic stools or Hemoccult is positive we will consult with GI for further evaluation and management.  3.  Transaminitis -??  Etiology. -Patient with no significant right upper quadrant tenderness to palpation, however does complain of decreased appetite with poor oral intake. -LFTs slowly trending down. -Right upper quadrant ultrasound with intermediate density sludge almost completely filling the gallbladder lumen, no shadowing stones are seen no sonographic evidence of acute cholecystitis, normal caliber bile ducts, mild hepatic steatosis. -CT abdomen and pelvis with high attenuation material filling gallbladder lumen, likely very carious excretion of previously administered contrast, gallbladder sludge could also give this appearance, no evidence of cholelithiasis or cholecystitis otherwise unremarkable enhanced CT of abdomen and pelvis. -HIDA scan done with patent cystic duct and common bile duct, delayed filling of gallbladder could indicate chronic cholecystitis, gallbladder EF 11%. -Discussed with general surgery PA on 10/30/2022 and at this time as patient is asymptomatic no further workup needed at this time in reference to HIDA scan with gallbladder EF of 11%.   4.  Previous lumbar degenerative disc disease -Stable.  5.  Hypokalemia -K-Dur 40 mEq p.o. x 1.   -Magnesium at 1.7, will give magnesium sulfate 2 g IV x 1.    6.  Previously treated syphilis  7.  Orthostatic hypotension -Improved with hydration.   -IV  fluids.   DVT prophylaxis: Heparin>>>>> Lovenox Code Status: Full Family Communication: Updated patient.  No family at bedside. Disposition: Likely home when  clinically improved.  Status is: Inpatient Remains inpatient appropriate because: Severity of illness   Consultants:  None  Procedures:  HIDA scan 10/30/2022 CT head 10/28/2022 CT abdomen and pelvis 10/28/2022 Right upper quadrant ultrasound 10/28/2022 2D echo 10/28/2022  Antimicrobials:  Anti-infectives (From admission, onward)    None         Subjective: Sitting up in bed.  Patient with some complaints of poor appetite.  Denies any chest pain.  No shortness of breath.  Some abdominal discomfort which she feels is from not eating.  Denies any diarrhea.    Objective: Vitals:   10/30/22 1423 10/30/22 1613 10/30/22 2102 10/31/22 0553  BP: 126/72 133/74 (!) 140/81 137/76  Pulse: 60 (!) 51 (!) 53 60  Resp: 18 18 18 18   Temp: 97.9 F (36.6 C) 97.8 F (36.6 C) 97.9 F (36.6 C)   TempSrc: Oral Oral Oral   SpO2: 100% 100% 100% 96%  Weight:      Height:        Intake/Output Summary (Last 24 hours) at 10/31/2022 1043 Last data filed at 10/31/2022 0556 Gross per 24 hour  Intake 3 ml  Output 1130 ml  Net -1127 ml    Filed Weights   10/27/22 2231 10/29/22 0500  Weight: 89.3 kg 89.3 kg    Examination:  General exam: NAD Respiratory system: CTAB.  No wheezes, no crackles, no rhonchi.  Fair air movement.  Speaking in full sentences.  Cardiovascular system: Regular rate rhythm no murmurs rubs or gallops.  No JVD.  No lower extremity edema.   Gastrointestinal system: Abdomen is soft, nontender, nondistended, positive bowel sounds.  No rebound.  No guarding.  Central nervous system: Alert and oriented. No focal neurological deficits. Extremities: Symmetric 5 x 5 power. Skin: No rashes, lesions or ulcers Psychiatry: Judgement and insight appear normal. Mood & affect appropriate.     Data Reviewed: I have personally reviewed following labs and imaging studies  CBC: Recent Labs  Lab 10/27/22 2226 10/28/22 0425 10/29/22 0727 10/30/22 0806 10/31/22 0817  WBC 11.6* 11.5* 6.5  5.8 5.8  NEUTROABS 8.6*  --   --   --   --   HGB 11.6* 10.9* 9.7* 8.4* 8.0*  HCT 35.9* 32.7* 29.4* 25.4* 24.4*  MCV 89.5 90.1 88.3 89.1 88.7  PLT 260 251 222 216 222     Basic Metabolic Panel: Recent Labs  Lab 10/27/22 2226 10/28/22 0425 10/29/22 0727 10/30/22 0806 10/31/22 0817  NA 132* 134* 132* 132* 135  K 4.0 4.0 3.6 3.4* 3.4*  CL 95* 98 105 102 107  CO2 22 25 20* 20* 22  GLUCOSE 128* 107* 87 73 68*  BUN 51* 50* 29* 20 16  CREATININE 2.46* 2.05* 1.09 1.03 0.95  CALCIUM 10.0 9.5 9.1 9.0 8.9  MG 2.1  --   --   --  1.7  PHOS  --   --   --   --  2.6     GFR: Estimated Creatinine Clearance: 90.1 mL/min (by C-G formula based on SCr of 0.95 mg/dL).  Liver Function Tests: Recent Labs  Lab 10/27/22 2226 10/28/22 0425 10/29/22 0727 10/30/22 0806 10/31/22 0817  AST 92* 87* 79* 63* 56*  ALT 142* 135* 119* 103* 96*  ALKPHOS 108 104 91 84 86  BILITOT 1.4* 1.4* 1.4* 0.9 0.7  PROT 6.3* 6.1* 5.5*  5.2* 4.9*  ALBUMIN 3.2* 3.0* 2.8* 2.5* 2.4*     CBG: No results for input(s): "GLUCAP" in the last 168 hours.   No results found for this or any previous visit (from the past 240 hour(s)).       Radiology Studies: NM Hepato W/EF  Result Date: 10/30/2022 CLINICAL DATA:  RIGHT upper quadrant pain. Concern for acalculous cholecystitis. EXAM: NUCLEAR MEDICINE HEPATOBILIARY IMAGING WITH GALLBLADDER EF TECHNIQUE: Sequential images of the abdomen were obtained out to 60 minutes following intravenous administration of radiopharmaceutical. After oral ingestion of Ensure, gallbladder ejection fraction was determined. At 60 min, normal ejection fraction is greater than 33%. RADIOPHARMACEUTICALS:  5.4 mCi Tc-96m  Choletec IV COMPARISON:  None Available. FINDINGS: Prompt clearance radiotracer from blood pool and homogeneous uptake in liver. Gallbladder not visualized at 60 minutes. Gallbladder begins to fill at 75 minutes. Administration of fatty meal demonstrates poor contraction of the  gallbladder. Calculated gallbladder ejection fraction is 11%. (Normal gallbladder ejection fraction with Ensure is greater than 33%.) IMPRESSION: 1. Patent cystic duct and common bile duct. 2. Delayed filling of the gallbladder could indicate chronic cholecystitis. 3. Low gallbladder ejection fraction (11%). Electronically Signed   By: Genevive Bi M.D.   On: 10/30/2022 14:33        Scheduled Meds:  enoxaparin (LOVENOX) injection  40 mg Subcutaneous Q24H   feeding supplement  237 mL Oral BID BM   multivitamin  1 tablet Oral QHS   sodium chloride flush  3 mL Intravenous Q12H   Continuous Infusions:  sodium chloride 100 mL/hr at 10/31/22 0121   magnesium sulfate bolus IVPB 2 g (10/31/22 1033)     LOS: 3 days    Time spent: 35 minutes    Ramiro Harvest, MD [facility]   To contact the attending provider between 7A-7P or the covering provider during after hours 7P-7A, please log into the web site [email or web address] and access using universal [place] password for that web site. If you do not have the password, please call the hospital operator.  10/31/2022, 10:43 AM

## 2022-11-01 ENCOUNTER — Inpatient Hospital Stay (HOSPITAL_COMMUNITY): Payer: Medicare HMO

## 2022-11-01 DIAGNOSIS — R42 Dizziness and giddiness: Secondary | ICD-10-CM

## 2022-11-01 LAB — CBC
HCT: 24.3 % — ABNORMAL LOW (ref 39.0–52.0)
Hemoglobin: 8.1 g/dL — ABNORMAL LOW (ref 13.0–17.0)
MCH: 30.2 pg (ref 26.0–34.0)
MCHC: 33.3 g/dL (ref 30.0–36.0)
MCV: 90.7 fL (ref 80.0–100.0)
Platelets: 221 10*3/uL (ref 150–400)
RBC: 2.68 MIL/uL — ABNORMAL LOW (ref 4.22–5.81)
RDW: 12.8 % (ref 11.5–15.5)
WBC: 6.1 10*3/uL (ref 4.0–10.5)
nRBC: 0 % (ref 0.0–0.2)

## 2022-11-01 LAB — COMPREHENSIVE METABOLIC PANEL
ALT: 93 U/L — ABNORMAL HIGH (ref 0–44)
AST: 53 U/L — ABNORMAL HIGH (ref 15–41)
Albumin: 2.4 g/dL — ABNORMAL LOW (ref 3.5–5.0)
Alkaline Phosphatase: 81 U/L (ref 38–126)
Anion gap: 6 (ref 5–15)
BUN: 12 mg/dL (ref 8–23)
CO2: 23 mmol/L (ref 22–32)
Calcium: 9.2 mg/dL (ref 8.9–10.3)
Chloride: 108 mmol/L (ref 98–111)
Creatinine, Ser: 0.84 mg/dL (ref 0.61–1.24)
GFR, Estimated: >60 mL/min (ref >60)
Glucose, Bld: 128 mg/dL — ABNORMAL HIGH (ref 70–99)
Potassium: 3.1 mmol/L — ABNORMAL LOW (ref 3.5–5.1)
Sodium: 137 mmol/L (ref 135–145)
Total Bilirubin: 0.6 mg/dL (ref 0.3–1.2)
Total Protein: 5 g/dL — ABNORMAL LOW (ref 6.5–8.1)

## 2022-11-01 LAB — MAGNESIUM: Magnesium: 1.8 mg/dL (ref 1.7–2.4)

## 2022-11-01 MED ORDER — POTASSIUM CHLORIDE CRYS ER 10 MEQ PO TBCR
40.0000 meq | EXTENDED_RELEASE_TABLET | ORAL | Status: AC
  Administered 2022-11-01 (×2): 40 meq via ORAL
  Filled 2022-11-01 (×2): qty 4

## 2022-11-01 MED ORDER — MAGNESIUM SULFATE 2 GM/50ML IV SOLN
2.0000 g | Freq: Once | INTRAVENOUS | Status: AC
  Administered 2022-11-01: 2 g via INTRAVENOUS
  Filled 2022-11-01: qty 50

## 2022-11-01 NOTE — Care Management Important Message (Signed)
Important Message  Patient Details  Name: Brandon [name] MRN: [id number] Date of Birth: [birth year]   Medicare Important Message Given:  Yes     Dorena Bodo 11/01/2022, 3:45 PM

## 2022-11-01 NOTE — Progress Notes (Signed)
PROGRESS NOTE    Brandon [name]  MVH:[id number] DOB: Nov 15, 1950 DOA: 10/27/2022 PCP: Donita Brooks, MD    Chief Complaint  Patient presents with   SYNCOPE    Brief Narrative:  Patient 72 year old gentleman history of recent surgery dental 4 weeks prior to admission, prior history of syphilis, known underlying lumbar DDD L3-4, L4-5, L5-S1 per Dr. Dutch Quint back in 2009, hypertension, RLS presented to [facility], ED 10/20/2022 with lower abdominal pain unable to eat or drink because his gums were swollen and painful, denied nausea or vomiting endorses decreased urine/stool output.  Concern for constipation no acute process LFTs elevated new since 09/20/2022, lipase was normal patient given IV fluids Zofran CT scan negative for any acute process. -Patient presented back to ED 6/3 unable to eat, bad taste in mouth and being found on the floor for 2 days was diaphoretic dizzy and blacked out.  On admission BUN/creatinine 51/2.4, AST ALT 92/142, lactic acid level of 2.4. -CT head negative no obstructive uropathy, CT abdomen pelvis with mention of exuberant contrast in the gallbladder. -HIDA scan done 10/29/2022 with low EF and gallbladder approximately 11%.   Assessment & Plan:   Principal Problem:   AKI (acute kidney injury) (HCC) Active Problems:   Hypertension   Syncope   Elevated LFTs   Anemia   Hypokalemia   Orthostatic hypotension   #1 acute kidney injury, POA -Likely secondary to a prerenal azotemia secondary to poor oral intake, patient noted to be orthostatic on admission. -Urinalysis done nitrite negative leukocytes negative, 30 protein, specific gravity 1.034. -Urine sodium was 79, urine creatinine of 104. -Renal function improved with hydration. -Saline lock IV fluids.  2.  Anemia -??  Etiology. -??  Dilutional. -Hemoglobin currently at 8.1 from 8.0 from 8.4 from 9.7 from 11.6 on admission. -FOBT pending. -Anemia panel consistent with anemia of chronic disease, iron  of 37, TIBC of 164, ferritin of 444, folate of 13.0. -Per Dr. Mahala Menghini patient stated had a colonoscopy, review of records showed external and internal hemorrhoids 01/22/2012 by Dr. Kelvin Cellar. -Patient with no melanotic stools and no bowel movement today.   -Follow H&H.   -If further drop in hemoglobin and patient does endorse melanotic stools or Hemoccult is positive we will consult with GI for further evaluation and management.  3.  Transaminitis -??  Etiology. -Patient with no significant right upper quadrant tenderness to palpation, however does complain of decreased appetite with poor oral intake. -LFTs slowly trending down. -Right upper quadrant ultrasound with intermediate density sludge almost completely filling the gallbladder lumen, no shadowing stones are seen no sonographic evidence of acute cholecystitis, normal caliber bile ducts, mild hepatic steatosis. -CT abdomen and pelvis with high attenuation material filling gallbladder lumen, likely very carious excretion of previously administered contrast, gallbladder sludge could also give this appearance, no evidence of cholelithiasis or cholecystitis otherwise unremarkable enhanced CT of abdomen and pelvis. -HIDA scan done with patent cystic duct and common bile duct, delayed filling of gallbladder could indicate chronic cholecystitis, gallbladder EF 11%. -Discussed with general surgery PA on 10/30/2022 and at this time as patient is asymptomatic no further workup needed at this time in reference to HIDA scan with gallbladder EF of 11%.  -Will need outpatient follow-up.  4.  Previous lumbar degenerative disc disease -Stable.  5.  Hypokalemia -Potassium at 3.1.  Magnesium at 1.8.   -K-Dur 40 mEq p.o. every 4 hours x 2 doses.   -Magnesium sulfate 2 g IV x 1.  K-Dur 40  mEq p.o. x 1.    6.  Previously treated syphilis  7.  Orthostatic hypotension -Improved with hydration.   -Repeat orthostatics negative. -Saline lock IV fluids.  8.   Dizziness -Patient with some complaints of dizziness from supine to sitting and standing.  PT note and in discussion with patient this morning. -Patient is slowly feeling better. -MRI ordered this morning negative for any acute abnormalities. -PT for vestibular evaluation.   DVT prophylaxis: Heparin>>>>> Lovenox Code Status: Full Family Communication: Updated patient.  No family at bedside. Disposition: Likely home when clinically improved.  Status is: Inpatient Remains inpatient appropriate because: Severity of illness   Consultants:  None  Procedures:  HIDA scan 10/30/2022 CT head 10/28/2022 CT abdomen and pelvis 10/28/2022 Right upper quadrant ultrasound 10/28/2022 2D echo 10/28/2022 MRI brain 11/01/2022   Antimicrobials:  Anti-infectives (From admission, onward)    None         Subjective: Laying in bed.  Overall states he is feeling better.  Feels appetite is improving after being started on appetite stimulant.  Noted to have complaints of dizziness and the feeling that he may pass out when he stood up and working with physical therapy yesterday.  Denies any chest pain.  No shortness of breath.  Denies any diarrhea.    Objective: Vitals:   10/31/22 1547 10/31/22 1929 11/01/22 0424 11/01/22 0826  BP: 138/77 132/63 (!) 156/72 (!) 144/75  Pulse: (!) 56 (!) 58 (!) 58 (!) 53  Resp: 18 18 18 18   Temp: 97.9 F (36.6 C) 98.5 F (36.9 C) 98.5 F (36.9 C) 98.2 F (36.8 C)  TempSrc: Oral   Oral  SpO2: 99% 100% 99% 99%  Weight:      Height:        Intake/Output Summary (Last 24 hours) at 11/01/2022 1128 Last data filed at 11/01/2022 1610 Gross per 24 hour  Intake --  Output 1600 ml  Net -1600 ml    Filed Weights   10/27/22 2231 10/29/22 0500  Weight: 89.3 kg 89.3 kg    Examination:  General exam: NAD Respiratory system: Lungs clear to auscultation bilaterally.  No wheezes, no crackles, no rhonchi.  Fair air movement.  Speaking in full sentences.  Cardiovascular  system: RRR no murmurs rubs or gallops.  No JVD.  No lower extremity edema.  Gastrointestinal system: Abdomen is soft, nontender, nondistended, positive bowel sounds.  No rebound.  No guarding.   Central nervous system: Alert and oriented. No focal neurological deficits. Extremities: Symmetric 5 x 5 power. Skin: No rashes, lesions or ulcers Psychiatry: Judgement and insight appear normal. Mood & affect appropriate.     Data Reviewed: I have personally reviewed following labs and imaging studies  CBC: Recent Labs  Lab 10/27/22 2226 10/28/22 0425 10/29/22 0727 10/30/22 0806 10/31/22 0817  WBC 11.6* 11.5* 6.5 5.8 5.8  NEUTROABS 8.6*  --   --   --   --   HGB 11.6* 10.9* 9.7* 8.4* 8.0*  HCT 35.9* 32.7* 29.4* 25.4* 24.4*  MCV 89.5 90.1 88.3 89.1 88.7  PLT 260 251 222 216 222     Basic Metabolic Panel: Recent Labs  Lab 10/27/22 2226 10/28/22 0425 10/29/22 0727 10/30/22 0806 10/31/22 0817  NA 132* 134* 132* 132* 135  K 4.0 4.0 3.6 3.4* 3.4*  CL 95* 98 105 102 107  CO2 22 25 20* 20* 22  GLUCOSE 128* 107* 87 73 68*  BUN 51* 50* 29* 20 16  CREATININE 2.46* 2.05* 1.09 1.03  0.95  CALCIUM 10.0 9.5 9.1 9.0 8.9  MG 2.1  --   --   --  1.7  PHOS  --   --   --   --  2.6     GFR: Estimated Creatinine Clearance: 90.1 mL/min (by C-G formula based on SCr of 0.95 mg/dL).  Liver Function Tests: Recent Labs  Lab 10/27/22 2226 10/28/22 0425 10/29/22 0727 10/30/22 0806 10/31/22 0817  AST 92* 87* 79* 63* 56*  ALT 142* 135* 119* 103* 96*  ALKPHOS 108 104 91 84 86  BILITOT 1.4* 1.4* 1.4* 0.9 0.7  PROT 6.3* 6.1* 5.5* 5.2* 4.9*  ALBUMIN 3.2* 3.0* 2.8* 2.5* 2.4*     CBG: No results for input(s): "GLUCAP" in the last 168 hours.   No results found for this or any previous visit (from the past 240 hour(s)).       Radiology Studies: NM Hepato W/EF  Result Date: 10/30/2022 CLINICAL DATA:  RIGHT upper quadrant pain. Concern for acalculous cholecystitis. EXAM: NUCLEAR MEDICINE  HEPATOBILIARY IMAGING WITH GALLBLADDER EF TECHNIQUE: Sequential images of the abdomen were obtained out to 60 minutes following intravenous administration of radiopharmaceutical. After oral ingestion of Ensure, gallbladder ejection fraction was determined. At 60 min, normal ejection fraction is greater than 33%. RADIOPHARMACEUTICALS:  5.4 mCi Tc-58m  Choletec IV COMPARISON:  None Available. FINDINGS: Prompt clearance radiotracer from blood pool and homogeneous uptake in liver. Gallbladder not visualized at 60 minutes. Gallbladder begins to fill at 75 minutes. Administration of fatty meal demonstrates poor contraction of the gallbladder. Calculated gallbladder ejection fraction is 11%. (Normal gallbladder ejection fraction with Ensure is greater than 33%.) IMPRESSION: 1. Patent cystic duct and common bile duct. 2. Delayed filling of the gallbladder could indicate chronic cholecystitis. 3. Low gallbladder ejection fraction (11%). Electronically Signed   By: Genevive Bi M.D.   On: 10/30/2022 14:33        Scheduled Meds:  enoxaparin (LOVENOX) injection  40 mg Subcutaneous Q24H   feeding supplement  237 mL Oral BID BM   megestrol  400 mg Oral Daily   multivitamin  1 tablet Oral QHS   sodium chloride flush  3 mL Intravenous Q12H   Continuous Infusions:     LOS: 4 days    Time spent: 35 minutes    Ramiro Harvest, MD [facility]   To contact the attending provider between 7A-7P or the covering provider during after hours 7P-7A, please log into the web site [email or web address] and access using universal [place] password for that web site. If you do not have the password, please call the hospital operator.  11/01/2022, 11:28 AM

## 2022-11-02 DIAGNOSIS — R42 Dizziness and giddiness: Secondary | ICD-10-CM

## 2022-11-02 DIAGNOSIS — E860 Dehydration: Secondary | ICD-10-CM

## 2022-11-02 LAB — BASIC METABOLIC PANEL
Anion gap: 10 (ref 5–15)
BUN: 9 mg/dL (ref 8–23)
CO2: 21 mmol/L — ABNORMAL LOW (ref 22–32)
Calcium: 9.2 mg/dL (ref 8.9–10.3)
Chloride: 105 mmol/L (ref 98–111)
Creatinine, Ser: 0.87 mg/dL (ref 0.61–1.24)
GFR, Estimated: >60 mL/min (ref >60)
Glucose, Bld: 72 mg/dL (ref 70–99)
Potassium: 3.9 mmol/L (ref 3.5–5.1)
Sodium: 136 mmol/L (ref 135–145)

## 2022-11-02 LAB — CBC
HCT: 26.2 % — ABNORMAL LOW (ref 39.0–52.0)
Hemoglobin: 8.7 g/dL — ABNORMAL LOW (ref 13.0–17.0)
MCH: 29.2 pg (ref 26.0–34.0)
MCHC: 33.2 g/dL (ref 30.0–36.0)
MCV: 87.9 fL (ref 80.0–100.0)
Platelets: 255 10*3/uL (ref 150–400)
RBC: 2.98 MIL/uL — ABNORMAL LOW (ref 4.22–5.81)
RDW: 12.5 % (ref 11.5–15.5)
WBC: 6.8 10*3/uL (ref 4.0–10.5)
nRBC: 0 % (ref 0.0–0.2)

## 2022-11-02 LAB — MAGNESIUM: Magnesium: 2.1 mg/dL (ref 1.7–2.4)

## 2022-11-02 MED ORDER — ALBUMIN HUMAN 25 % IV SOLN
25.0000 g | Freq: Two times a day (BID) | INTRAVENOUS | Status: AC
  Administered 2022-11-02 (×2): 25 g via INTRAVENOUS
  Filled 2022-11-02 (×2): qty 100

## 2022-11-02 NOTE — Progress Notes (Signed)
PT/Vestibular Assessment    11/02/22 1016  Vestibular Assessment  General Observation Supine, flat affect, not feeling well in general  Symptom Behavior  Subjective history of current problem Began 5 weeks ago when he had his infected teeth removed. Reports one fall last week due to lightheadedness+slip  Type of Dizziness  Lightheadedness  Frequency of Dizziness daily  Duration of Dizziness while up moving around  Symptom Nature Positional  Aggravating Factors Supine to sit;Sit to stand;Activity in general  Relieving Factors Lying supine  Progression of Symptoms Worse  History of similar episodes none  Oculomotor Exam  Oculomotor Alignment Normal  Spontaneous Absent  Smooth Pursuits Intact  Positional Testing  Dix-Hallpike Dix-Hallpike Right;Dix-Hallpike Left  Horizontal Canal Testing Horizontal Canal Right;Horizontal Canal Left  Dix-Hallpike Right  Dix-Hallpike Right Duration 0  Dix-Hallpike Right Symptoms No nystagmus  Dix-Hallpike Left  Dix-Hallpike Left Duration 0  Dix-Hallpike Left Symptoms No nystagmus  Horizontal Canal Right  Horizontal Canal Right Duration 0  Horizontal Canal Right Symptoms Normal  Horizontal Canal Left  Horizontal Canal Left Duration 0  Horizontal Canal Left Symptoms Normal  Cognition  Cognition Orientation Level Oriented x 4  Orthostatics  Orthostatics Comment see earlier PT note: +symptomatic with drop in SBP     11/02/22 1020  PT Visit Information  Last PT Received On 11/02/22  PT Time Calculation  PT Start Time (ACUTE ONLY) 1003  PT Stop Time (ACUTE ONLY) 1013  PT Time Calculation (min) (ACUTE ONLY) 10 min  PT General Charges  $$ ACUTE PT VISIT 1 Visit  PT Treatments  $Physical Performance Test 8-22 mins     Jerolyn Center, PT Acute Rehabilitation Services  Office [phone]

## 2022-11-02 NOTE — Progress Notes (Signed)
PROGRESS NOTE    Brandon [name]  WUJ:[id number] DOB: 1950-11-14 DOA: 10/27/2022 PCP: Donita Brooks, MD    Chief Complaint  Patient presents with   SYNCOPE    Brief Narrative:  Patient 72 year old gentleman history of recent surgery dental 4 weeks prior to admission, prior history of syphilis, known underlying lumbar DDD L3-4, L4-5, L5-S1 per Dr. Dutch Quint back in 2009, hypertension, RLS presented to [facility], ED 10/20/2022 with lower abdominal pain unable to eat or drink because his gums were swollen and painful, denied nausea or vomiting endorses decreased urine/stool output.  Concern for constipation no acute process LFTs elevated new since 09/20/2022, lipase was normal patient given IV fluids Zofran CT scan negative for any acute process. -Patient presented back to ED 6/3 unable to eat, bad taste in mouth and being found on the floor for 2 days was diaphoretic dizzy and blacked out.  On admission BUN/creatinine 51/2.4, AST ALT 92/142, lactic acid level of 2.4. -CT head negative no obstructive uropathy, CT abdomen pelvis with mention of exuberant contrast in the gallbladder. -HIDA scan done 10/29/2022 with low EF and gallbladder approximately 11%.   Assessment & Plan:   Principal Problem:   AKI (acute kidney injury) (HCC) Active Problems:   Hypertension   Syncope   Elevated LFTs   Anemia   Hypokalemia   Orthostatic hypotension   Dehydration   Dizziness   #1 acute kidney injury, POA -Likely secondary to a prerenal azotemia secondary to poor oral intake, patient noted to be orthostatic on admission. -Urinalysis done nitrite negative leukocytes negative, 30 protein, specific gravity 1.034. -Urine sodium was 79, urine creatinine of 104. -Renal function improved with hydration. -Saline lock IV fluids.  2.  Anemia -??  Etiology. -??  Dilutional. -Hemoglobin currently at 8.7 from 8.1 from 8.0 from 8.4 from 9.7 from 11.6 on admission. -FOBT pending. -Anemia panel consistent  with anemia of chronic disease, iron of 37, TIBC of 164, ferritin of 444, folate of 13.0. -Per Dr. Mahala Menghini patient stated had a colonoscopy, review of records showed external and internal hemorrhoids 01/22/2012 by Dr. Kelvin Cellar. -Patient with no melanotic stools and no bowel movement today.   -Follow H&H.   -If further drop in hemoglobin and patient does endorse melanotic stools or Hemoccult is positive we will consult with GI for further evaluation and management.  3.  Transaminitis -??  Etiology. -Patient with no significant right upper quadrant tenderness to palpation, however does complain of decreased appetite with poor oral intake. -LFTs slowly trending down. -Right upper quadrant ultrasound with intermediate density sludge almost completely filling the gallbladder lumen, no shadowing stones are seen no sonographic evidence of acute cholecystitis, normal caliber bile ducts, mild hepatic steatosis. -CT abdomen and pelvis with high attenuation material filling gallbladder lumen, likely very carious excretion of previously administered contrast, gallbladder sludge could also give this appearance, no evidence of cholelithiasis or cholecystitis otherwise unremarkable enhanced CT of abdomen and pelvis. -HIDA scan done with patent cystic duct and common bile duct, delayed filling of gallbladder could indicate chronic cholecystitis, gallbladder EF 11%. -Discussed with general surgery PA on 10/30/2022 and at this time as patient is asymptomatic no further workup needed at this time in reference to HIDA scan with gallbladder EF of 11%.  -Will need outpatient follow-up.  4.  Previous lumbar degenerative disc disease -Stable.  5.  Hypokalemia -Repleted, potassium at 3.9, magnesium at 2.1.   -Repeat labs in the AM.  6.  Previously treated syphilis  7.  Orthostatic hypotension -Improved with hydration.   -Repeat orthostatics negative on 11/01/2022. -Orthostatics repeated today by PT, patient noted to be  symptomatic with significant lightheadedness with a drop in blood pressure 40 mmHg with positional change from supine to standing. -IV albumin every 12 hours x 1 day. -Thigh-high TED hose. -Repeat orthostatics in the AM.  8.  Dizziness -Patient with some complaints of dizziness from supine to sitting and standing.  PT note and in discussion yesterday and today.   -Slowly feeling better however still with significant lightheadedness.  PT with orthostatics with blood pressure dropping about 14 mmHg.   -MRI head done unremarkable.   -2D echo done with no significant valvular abnormalities.   -Vestibular evaluation negative for BPPV.   -Placed on TED hose.   -IV albumin twice daily x 1 day.   -Repeat orthostatics in the a.m. and assess clinically.    DVT prophylaxis: Heparin>>>>> Lovenox Code Status: Full Family Communication: Updated patient.  No family at bedside. Disposition: Likely home when clinically improved.  Status is: Inpatient Remains inpatient appropriate because: Severity of illness   Consultants:  None  Procedures:  HIDA scan 10/30/2022 CT head 10/28/2022 CT abdomen and pelvis 10/28/2022 Right upper quadrant ultrasound 10/28/2022 2D echo 10/28/2022 MRI brain 11/01/2022   Antimicrobials:  Anti-infectives (From admission, onward)    None         Subjective: Patient with complaints of lightheadedness with positional changes from laying to sitting and standing.  Patient was assessed by PT noted to have a blood pressure drop of about 14 mmHg from supine to standing.  No chest pain.  No shortness of breath.  No abdominal pain.  No watery loose stools.  Patient feels appetite is picking up with appetite stimulant.  Per RN patient with poor oral intake.  Objective: Vitals:   11/01/22 1935 11/02/22 0510 11/02/22 0810 11/02/22 0934  BP: (!) 148/71 (!) 143/88 (!) 149/82 (!) 143/87  Pulse: (!) 55 (!) 58 (!) 58 68  Resp: 16 18 18    Temp: 98.6 F (37 C) 98.7 F (37.1 C) (!)  97.4 F (36.3 C)   TempSrc:      SpO2: 99% 99% 97%   Weight:      Height:        Intake/Output Summary (Last 24 hours) at 11/02/2022 1417 Last data filed at 11/02/2022 1318 Gross per 24 hour  Intake --  Output 1675 ml  Net -1675 ml   Filed Weights   10/27/22 2231 10/29/22 0500  Weight: 89.3 kg 89.3 kg    Examination:  General exam: NAD. Respiratory system: CTAB.  No wheezes, no crackles, no rhonchi.  Fair air movement.  Speaking in full sentences.   Cardiovascular system: Regular rate rhythm no murmurs rubs or gallops.  No JVD.  No lower extremity edema. Gastrointestinal system: Abdomen is soft, nontender, nondistended, positive bowel sounds.  No rebound.  No guarding.   Central nervous system: Alert and oriented.  Moving extremities spontaneously.  No focal neurological deficits. Extremities: Symmetric 5 x 5 power. Skin: No rashes, lesions or ulcers Psychiatry: Judgement and insight appear normal. Mood & affect appropriate.     Data Reviewed: I have personally reviewed following labs and imaging studies  CBC: Recent Labs  Lab 10/27/22 2226 10/28/22 0425 10/29/22 0727 10/30/22 0806 10/31/22 0817 11/01/22 1038 11/02/22 0347  WBC 11.6*   < > 6.5 5.8 5.8 6.1 6.8  NEUTROABS 8.6*  --   --   --   --   --   --  HGB 11.6*   < > 9.7* 8.4* 8.0* 8.1* 8.7*  HCT 35.9*   < > 29.4* 25.4* 24.4* 24.3* 26.2*  MCV 89.5   < > 88.3 89.1 88.7 90.7 87.9  PLT 260   < > 222 216 222 221 255   < > = values in this interval not displayed.    Basic Metabolic Panel: Recent Labs  Lab 10/27/22 2226 10/28/22 0425 10/29/22 0727 10/30/22 0806 10/31/22 0817 11/01/22 1038 11/02/22 0347  NA 132*   < > 132* 132* 135 137 136  K 4.0   < > 3.6 3.4* 3.4* 3.1* 3.9  CL 95*   < > 105 102 107 108 105  CO2 22   < > 20* 20* 22 23 21*  GLUCOSE 128*   < > 87 73 68* 128* 72  BUN 51*   < > 29* 20 16 12 9   CREATININE 2.46*   < > 1.09 1.03 0.95 0.84 0.87  CALCIUM 10.0   < > 9.1 9.0 8.9 9.2 9.2  MG 2.1   --   --   --  1.7 1.8 2.1  PHOS  --   --   --   --  2.6  --   --    < > = values in this interval not displayed.    GFR: Estimated Creatinine Clearance: 98.4 mL/min (by C-G formula based on SCr of 0.87 mg/dL).  Liver Function Tests: Recent Labs  Lab 10/28/22 0425 10/29/22 0727 10/30/22 0806 10/31/22 0817 11/01/22 1038  AST 87* 79* 63* 56* 53*  ALT 135* 119* 103* 96* 93*  ALKPHOS 104 91 84 86 81  BILITOT 1.4* 1.4* 0.9 0.7 0.6  PROT 6.1* 5.5* 5.2* 4.9* 5.0*  ALBUMIN 3.0* 2.8* 2.5* 2.4* 2.4*    CBG: No results for input(s): "GLUCAP" in the last 168 hours.   No results found for this or any previous visit (from the past 240 hour(s)).       Radiology Studies: MR BRAIN WO CONTRAST  Result Date: 11/01/2022 CLINICAL DATA:  Syncope/presyncope, cerebrovascular cause suspected EXAM: MRI HEAD WITHOUT CONTRAST TECHNIQUE: Multiplanar, multiecho pulse sequences of the brain and surrounding structures were obtained without intravenous contrast. COMPARISON:  MRI head Oct 01, 2017 without report. FINDINGS: Brain: No acute infarction, hemorrhage, hydrocephalus, extra-axial collection or mass lesion. Vascular: Major arterial flow voids are maintained. Skull and upper cervical spine: Normal marrow signal. Sinuses/Orbits: Clear sinuses.  No acute findings. Other: No mastoid effusions. IMPRESSION: No evidence of acute intracranial abnormality. Electronically Signed   By: Feliberto Harts M.D.   On: 11/01/2022 16:06        Scheduled Meds:  enoxaparin (LOVENOX) injection  40 mg Subcutaneous Q24H   feeding supplement  237 mL Oral BID BM   megestrol  400 mg Oral Daily   multivitamin  1 tablet Oral QHS   sodium chloride flush  3 mL Intravenous Q12H   Continuous Infusions:  albumin human 25 g (11/02/22 1246)      LOS: 5 days    Time spent: 35 minutes    Ramiro Harvest, MD [facility]   To contact the attending provider between 7A-7P or the covering provider during after  hours 7P-7A, please log into the web site [email or web address] and access using universal [place] password for that web site. If you do not have the password, please call the hospital operator.  11/02/2022, 2:17 PM

## 2022-11-02 NOTE — Progress Notes (Signed)
TED hose applied  

## 2022-11-02 NOTE — Progress Notes (Signed)
Physical Therapy Treatment Patient Details Name: Brandon [name] MRN: [id number] DOB: 10/20/1950 Today's Date: 11/02/2022   History of Present Illness Pt is a 72 y.o. male admitted 6/2 with dx of AKI. He presented to the ED after multi syncopal episodes at home. 5 weeks PTA pt had infected teeth extracted at dentist followed by progressive loss of appetite, weakness, weight loss, and bad taste in his mouth. PMH: lumbar DDD, HTN    PT Comments    Patient continues to be limited in mobility due to lightheadedness with +drop in SBP with changes in position (although not by 20 mmHg--but unable to remain standing for 3 minute assessment due to lightheadedness). Patient did not feel safe ambulating in room due to degree of light headedness. Returned to chair-like position in bed. RN and MD made aware of lack of progress and BP issues.      11/02/22 0933  Orthostatic Lying   BP- Lying 141/80  Pulse- Lying 56  Orthostatic Sitting  BP- Sitting (!) 128/114  Pulse- Sitting 72  Orthostatic Standing at 0 minutes  BP- Standing at 0 minutes 128/85  Pulse- Standing at 0 minutes 102 (unable to stand for 3 minute measurement due to lightheadedness)    Recommendations for follow up therapy are one component of a multi-disciplinary discharge planning process, led by the attending physician.  Recommendations may be updated based on patient status, additional functional criteria and insurance authorization.  Follow Up Recommendations       Assistance Recommended at Discharge PRN  Patient can return home with the following Assistance with cooking/housework;Assist for transportation;Help with stairs or ramp for entrance   Equipment Recommendations  Other (comment) (to further assess)    Recommendations for Other Services       Precautions / Restrictions Precautions Precautions: Fall;Other (comment) Precaution Comments: watch BP (+orthostatics in ED) Restrictions Weight Bearing Restrictions:  No     Mobility  Bed Mobility Overal bed mobility: Needs Assistance Bed Mobility: Supine to Sit, Sit to Supine     Supine to sit: Supervision Sit to supine: Independent   General bed mobility comments: supervision for safety due to h/o syncope    Transfers Overall transfer level: Needs assistance Equipment used: None Transfers: Sit to/from Stand Sit to Stand: Min guard           General transfer comment: for safety; mild sway in standing w/ dizziness    Ambulation/Gait               General Gait Details: unable to ambulate due to lightheadedness   Stairs             Wheelchair Mobility    Modified Rankin (Stroke Patients Only)       Balance Overall balance assessment: Mild deficits observed, not formally tested                                          Cognition Arousal/Alertness: Awake/alert Behavior During Therapy: Flat affect Overall Cognitive Status: Within Functional Limits for tasks assessed                                          Exercises      General Comments General comments (skin integrity, edema, etc.): see orthostatic BPs; educated to try to sit  in upright position to help stimulate BP      Pertinent Vitals/Pain Pain Assessment Pain Assessment: No/denies pain    Home Living                          Prior Function            PT Goals (current goals can now be found in the care plan section) Acute Rehab PT Goals Patient Stated Goal: regain strength Time For Goal Achievement: 11/14/22 Potential to Achieve Goals: Good Progress towards PT goals: Not progressing toward goals - comment (dizziness)    Frequency    Min 3X/week      PT Plan Current plan remains appropriate (although not yet mobile enough to go home)    Co-evaluation              AM-PAC PT "6 Clicks" Mobility   Outcome Measure  Help needed turning from your back to your side while in a flat bed  without using bedrails?: None Help needed moving from lying on your back to sitting on the side of a flat bed without using bedrails?: A Little Help needed moving to and from a bed to a chair (including a wheelchair)?: A Little Help needed standing up from a chair using your arms (e.g., wheelchair or bedside chair)?: A Little Help needed to walk in hospital room?: Total Help needed climbing 3-5 steps with a railing? : Total 6 Click Score: 15    End of Session Equipment Utilized During Treatment: Gait belt Activity Tolerance: Patient limited by fatigue;Other (comment) (dizziness) Patient left: with call bell/phone within reach;in bed (in upright, chair-like position) Nurse Communication: Mobility status PT Visit Diagnosis: Muscle weakness (generalized) (M62.81);Difficulty in walking, not elsewhere classified (R26.2)     Time: 1610-9604 PT Time Calculation (min) (ACUTE ONLY): 17 min  Charges:  $Therapeutic Activity: 8-22 mins                      Brandon [name], PT Acute Rehabilitation Services  Office [phone]    Brandon [name] 11/02/2022, 9:39 AM

## 2022-11-03 LAB — RENAL FUNCTION PANEL
Albumin: 3.3 g/dL — ABNORMAL LOW (ref 3.5–5.0)
Anion gap: 10 (ref 5–15)
BUN: 9 mg/dL (ref 8–23)
CO2: 21 mmol/L — ABNORMAL LOW (ref 22–32)
Calcium: 9.9 mg/dL (ref 8.9–10.3)
Chloride: 104 mmol/L (ref 98–111)
Creatinine, Ser: 1.01 mg/dL (ref 0.61–1.24)
GFR, Estimated: >60 mL/min (ref >60)
Glucose, Bld: 67 mg/dL — ABNORMAL LOW (ref 70–99)
Phosphorus: 2.6 mg/dL (ref 2.5–4.6)
Potassium: 3.8 mmol/L (ref 3.5–5.1)
Sodium: 135 mmol/L (ref 135–145)

## 2022-11-03 LAB — CBC
HCT: 26.7 % — ABNORMAL LOW (ref 39.0–52.0)
Hemoglobin: 8.9 g/dL — ABNORMAL LOW (ref 13.0–17.0)
MCH: 29.5 pg (ref 26.0–34.0)
MCHC: 33.3 g/dL (ref 30.0–36.0)
MCV: 88.4 fL (ref 80.0–100.0)
Platelets: 283 10*3/uL (ref 150–400)
RBC: 3.02 MIL/uL — ABNORMAL LOW (ref 4.22–5.81)
RDW: 12.7 % (ref 11.5–15.5)
WBC: 6.8 10*3/uL (ref 4.0–10.5)
nRBC: 0 % (ref 0.0–0.2)

## 2022-11-03 LAB — MAGNESIUM: Magnesium: 2 mg/dL (ref 1.7–2.4)

## 2022-11-03 MED ORDER — LOSARTAN POTASSIUM 50 MG PO TABS
50.0000 mg | ORAL_TABLET | Freq: Every day | ORAL | 0 refills | Status: DC
Start: 2022-11-08 — End: 2022-11-16

## 2022-11-03 MED ORDER — RENA-VITE PO TABS: 1.0000 | ORAL_TABLET | Freq: Every day | ORAL | 0 refills | Status: DC

## 2022-11-03 MED ORDER — MEGESTROL ACETATE 400 MG/10ML PO SUSP: 400.0000 mg | Freq: Every day | ORAL | 1 refills | Status: DC

## 2022-11-03 MED ORDER — POLYETHYLENE GLYCOL 3350 17 G PO PACK: 17.0000 g | PACK | Freq: Every day | ORAL | Status: DC | PRN

## 2022-11-03 MED ORDER — ENSURE ENLIVE PO LIQD: 237.0000 mL | Freq: Two times a day (BID) | ORAL | 12 refills | Status: DC

## 2022-11-03 NOTE — Progress Notes (Signed)
PT AVS reviewed with pt and he understands and verbalized that he understood all DC teaching and instructions. Pt has all personal belongings and daughter will be transportation home upon her arrival via car.

## 2022-11-03 NOTE — Discharge Summary (Signed)
Physician Discharge Summary  Brandon [name] ZOX:[id number] DOB: July 24, 1950 DOA: 10/27/2022  PCP: Donita Brooks, MD  Admit date: 10/27/2022 Discharge date: 11/03/2022  Time spent: 60 minutes  Recommendations for Outpatient Follow-up:  Follow-up with Donita Brooks, MD in 1 week.  On follow-up patient's orthostatic hypotension will need to be reassessed.  Patient's blood pressure will need to be followed up upon.  Patient will need a comprehensive metabolic profile, CBC done to follow-up on electrolytes, renal function, LFTs, counts.  If patient continues to have further syncopal episodes may benefit from an event monitor for further evaluation.   Discharge Diagnoses:  Principal Problem:   AKI (acute kidney injury) (HCC) Active Problems:   Hypertension   Syncope   Elevated LFTs   Anemia   Hypokalemia   Orthostatic hypotension   Dehydration   Dizziness   Discharge Condition: Stable and improved.  Diet recommendation: Regular  Filed Weights   10/27/22 2231 10/29/22 0500  Weight: 89.3 kg 89.3 kg    History of present illness:  HPI per Dr. Orville Govern is a pleasant 72 y.o. male with medical history significant for hypertension and syphilis, now presenting to the emergency department with generalized weakness and multiple syncopal episodes.   Patient reports that he had dental work performed several weeks ago, was not eating or drinking due to oral pain, and has since gone on to develop loss of appetite and states that food does not taste right.  He has become progressively weak in general and has been experiencing lightheadedness upon standing.  He notes that his usual bilateral lower extremity edema is no longer present.     Patient was found on his bathroom floor at home, suffered a brief loss of consciousness while family was trying to help him up, and then had another syncopal episode when EMS was assisting him up.  He denies any recent chest pain or  palpitations, denies focal numbness or weakness, and denies vomiting or diarrhea.  He was given IV fluids prior to arrival in the ED.   ED Course: Upon arrival to the ED, patient is found to be afebrile and saturating well on room air with normal heart rate and systolic blood pressure of 95 and greater.  ED workup is most notable for BUN 51, creatinine 2.46, AST 92, ALT 142, lactic acid 2.4, negative head CT, and no obstructive uropathy on CT abdomen and pelvis.   He was given a liter of normal saline in the ED.   Hospital Course:  #1 acute kidney injury, POA -Likely secondary to a prerenal azotemia secondary to poor oral intake, patient noted to be orthostatic on admission in the setting of ARB. -Urinalysis done nitrite negative leukocytes negative, 30 protein, specific gravity 1.034. -Urine sodium was 79, urine creatinine of 104. -Renal function improved with hydration and acute kidney injury had resolved by day of discharge. -ARB was held and will be resumed 5 days postdischarge. -Outpatient follow-up with PCP.  2.  Syncope -Patient noted to have presented with some syncopal episodes felt likely secondary to orthostatic hypotension, dizziness in the setting of significant poor oral intake. -2D echo done was negative for any valvular abnormalities with normal EF. -MRI brain done negative for any acute abnormalities. -Patient placed on telemetry with no arrhythmias noted during the hospitalization. -Patient had no further syncopal episodes during the hospitalization. -Patient hydrated with IV fluids, orthostatic hypotension had resolved by day of discharge. -See #8, 9.   3.  Anemia -??  Etiology. -??  Dilutional. -Hemoglobin stabilized at 8.9 on day of discharge from 11.6 on admission.   -Patient had no overt bleeding.  -Anemia panel consistent with anemia of chronic disease, iron of 37, TIBC of 164, ferritin of 444, folate of 13.0. -Per Dr. Mahala Menghini patient stated had a colonoscopy,  review of records showed external and internal hemorrhoids 01/22/2012 by Dr. Kelvin Cellar. -Patient with no melanotic stools during the hospitalization.   -Outpatient follow-up with PCP.    4.  Transaminitis -??  Etiology. -Patient with no significant right upper quadrant tenderness to palpation, however did complain of decreased appetite with poor oral intake. -LFTs slowly trended down. -Right upper quadrant ultrasound with intermediate density sludge almost completely filling the gallbladder lumen, no shadowing stones are seen no sonographic evidence of acute cholecystitis, normal caliber bile ducts, mild hepatic steatosis. -CT abdomen and pelvis with high attenuation material filling gallbladder lumen, likely very carious excretion of previously administered contrast, gallbladder sludge could also give this appearance, no evidence of cholelithiasis or cholecystitis otherwise unremarkable enhanced CT of abdomen and pelvis. -HIDA scan done with patent cystic duct and common bile duct, delayed filling of gallbladder could indicate chronic cholecystitis, gallbladder EF 11%. -Discussed with general surgery PA on 10/30/2022 and at this time as patient is asymptomatic no further workup needed at this time in reference to HIDA scan with gallbladder EF of 11%.  -Will need outpatient follow-up with PCP.   5.  Previous lumbar degenerative disc disease -Stable.   6.  Hypokalemia -Repleted during the hospitalization. -Outpatient follow-up.   7.  Previously treated syphilis   8.  Orthostatic hypotension -Improved with hydration.   -Repeat orthostatics negative on 11/01/2022. -Orthostatics repeated on 10/28/2022 by PT, patient noted to be symptomatic with significant lightheadedness with a drop in blood pressure 40 mmHg with positional change from supine to standing. -Patient given IV albumin every 12 hours x 1 day, thigh-high TED hose placed.   -Patient improved clinically was asymptomatic by day of discharge  with repeat orthostatics with resolution of orthostasis.   -Outpatient follow-up with PCP.     9.  Dizziness -Patient with some complaints of dizziness from supine to sitting and standing per PT note and in discussion with patient..   -Patient noted as of 11/02/2022 to start feeling better in terms of his lightheadedness.  -Patient noted initially on admission with orthostatic hypotension which had improved and resolved by day of discharge with hydration and IV albumin.   -PT with repeat orthostatics on 11/02/2022, with blood pressure dropping about 14 mmHg.   -MRI head done unremarkable.   -2D echo done with no significant valvular abnormalities.   -Vestibular evaluation negative for BPPV.   -Placed on TED hose.   -IV albumin twice daily x 1 day.   -Patient improved clinically, had no further lightheadedness or dizziness by day of discharge and repeat orthostatics showed resolution of orthostatics. -Outpatient follow-up with PCP.    Procedures: HIDA scan 10/30/2022 CT head 10/28/2022 CT abdomen and pelvis 10/28/2022 Right upper quadrant ultrasound 10/28/2022 2D echo 10/28/2022 MRI brain 11/01/2022  Consultations: None  Discharge Exam: Vitals:   11/03/22 0822 11/03/22 0942  BP: (!) 177/84 (!) 158/72  Pulse: 60 65  Resp: 18   Temp: 97.8 F (36.6 C)   SpO2: 99%     General: NAD Cardiovascular: RRR no murmurs rubs or gallops.  No JVD.  No lower extremity edema. Respiratory: Clear to auscultation bilaterally.  No wheezes, no crackles, no rhonchi.  Fair  air movement.  Speaking in full sentences.  Discharge Instructions   Discharge Instructions     Ambulatory Referral for Lung Cancer Scre   Complete by: As directed    Diet general   Complete by: As directed    Increase activity slowly   Complete by: As directed       Allergies as of 11/03/2022   No Known Allergies      Medication List     STOP taking these medications    pramipexole 0.25 MG tablet Commonly known as:  Mirapex       TAKE these medications    feeding supplement Liqd Take 237 mLs by mouth 2 (two) times daily between meals. Start taking on: November 04, 2022   losartan 50 MG tablet Commonly known as: COZAAR Take 1 tablet (50 mg total) by mouth daily. Start taking on: November 08, 2022 What changed: These instructions start on November 08, 2022. If you are unsure what to do until then, ask your doctor or other care provider.   megestrol 400 MG/10ML suspension Commonly known as: MEGACE Take 10 mLs (400 mg total) by mouth daily. Start taking on: November 04, 2022   multivitamin Tabs tablet Take 1 tablet by mouth at bedtime.   ondansetron 8 MG disintegrating tablet Commonly known as: ZOFRAN-ODT Take 1 tablet (8 mg total) by mouth every 8 (eight) hours as needed for nausea or vomiting.   polyethylene glycol 17 g packet Commonly known as: MIRALAX / GLYCOLAX Take 17 g by mouth daily as needed for mild constipation.       No Known Allergies  Follow-up Information     Donita Brooks, MD. Schedule an appointment as soon as possible for a visit in 1 week(s).   Specialty: Family Medicine Contact information: [address] [place] [address] [phone]         Care, [facility] Follow up.   Specialty: Home Health Services Why: [facility] will be providing home health PT services.  They will call you to set up services in the home in the next 24-48 hours. Contact information: [address] [place] [address] [phone]                  The results of significant diagnostics from this hospitalization (including imaging, microbiology, ancillary and laboratory) are listed below for reference.    Significant Diagnostic Studies: MR BRAIN WO CONTRAST  Result Date: 11/01/2022 CLINICAL DATA:  Syncope/presyncope, cerebrovascular cause suspected EXAM: MRI HEAD WITHOUT CONTRAST TECHNIQUE: Multiplanar, multiecho pulse sequences of the brain  and surrounding structures were obtained without intravenous contrast. COMPARISON:  MRI head Oct 01, 2017 without report. FINDINGS: Brain: No acute infarction, hemorrhage, hydrocephalus, extra-axial collection or mass lesion. Vascular: Major arterial flow voids are maintained. Skull and upper cervical spine: Normal marrow signal. Sinuses/Orbits: Clear sinuses.  No acute findings. Other: No mastoid effusions. IMPRESSION: No evidence of acute intracranial abnormality. Electronically Signed   By: Feliberto Harts M.D.   On: 11/01/2022 16:06   NM Hepato W/EF  Result Date: 10/30/2022 CLINICAL DATA:  RIGHT upper quadrant pain. Concern for acalculous cholecystitis. EXAM: NUCLEAR MEDICINE HEPATOBILIARY IMAGING WITH GALLBLADDER EF TECHNIQUE: Sequential images of the abdomen were obtained out to 60 minutes following intravenous administration of radiopharmaceutical. After oral ingestion of Ensure, gallbladder ejection fraction was determined. At 60 min, normal ejection fraction is greater than 33%. RADIOPHARMACEUTICALS:  5.4 mCi Tc-58m  Choletec IV COMPARISON:  None Available.  FINDINGS: Prompt clearance radiotracer from blood pool and homogeneous uptake in liver. Gallbladder not visualized at 60 minutes. Gallbladder begins to fill at 75 minutes. Administration of fatty meal demonstrates poor contraction of the gallbladder. Calculated gallbladder ejection fraction is 11%. (Normal gallbladder ejection fraction with Ensure is greater than 33%.) IMPRESSION: 1. Patent cystic duct and common bile duct. 2. Delayed filling of the gallbladder could indicate chronic cholecystitis. 3. Low gallbladder ejection fraction (11%). Electronically Signed   By: Genevive Bi M.D.   On: 10/30/2022 14:33   ECHOCARDIOGRAM COMPLETE  Result Date: 10/28/2022    ECHOCARDIOGRAM REPORT   Patient Name:   ARIQ VANDUSER Date of Exam: 10/28/2022 Medical Rec #:  [id number]           Height:       76.0 in Accession #:    [id number]          Weight:        196.9 lb Date of Birth:  08-Jun-1950           BSA:          2.200 m Patient Age:    71 years            BP:           104/63 mmHg Patient Gender: M                   HR:           68 bpm. Exam Location:  Inpatient Procedure: 2D Echo, Cardiac Doppler and Color Doppler Indications:    Syncope  History:        Patient has no prior history of Echocardiogram examinations.                 Signs/Symptoms:Syncope; Risk Factors:Hypertension.  Sonographer:    Darlys Gales Referring Phys: [id number] TIMOTHY S OPYD IMPRESSIONS  1. Left ventricular ejection fraction, by estimation, is 65 to 70%. The left ventricle has normal function. The left ventricle has no regional wall motion abnormalities. There is mild concentric left ventricular hypertrophy. Left ventricular diastolic parameters are consistent with Grade I diastolic dysfunction (impaired relaxation).  2. Right ventricular systolic function is normal. The right ventricular size is normal.  3. The mitral valve is normal in structure. Trivial mitral valve regurgitation. No evidence of mitral stenosis.  4. The aortic valve is normal in structure. Aortic valve regurgitation is not visualized. No aortic stenosis is present.  5. The inferior vena cava is normal in size with greater than 50% respiratory variability, suggesting right atrial pressure of 3 mmHg.  6. Technically limited study due to poor sound wave transmission. FINDINGS  Left Ventricle: Left ventricular ejection fraction, by estimation, is 65 to 70%. The left ventricle has normal function. The left ventricle has no regional wall motion abnormalities. The left ventricular internal cavity size was normal in size. There is  mild concentric left ventricular hypertrophy. Left ventricular diastolic parameters are consistent with Grade I diastolic dysfunction (impaired relaxation). Right Ventricle: The right ventricular size is normal. No increase in right ventricular wall thickness. Right ventricular systolic function  is normal. Left Atrium: Left atrial size was normal in size. Right Atrium: Right atrial size was normal in size. Pericardium: There is no evidence of pericardial effusion. Mitral Valve: The mitral valve is normal in structure. Trivial mitral valve regurgitation. No evidence of mitral valve stenosis. Tricuspid Valve: The tricuspid valve is normal in structure. Tricuspid valve regurgitation is trivial. No evidence  of tricuspid stenosis. Aortic Valve: The aortic valve is normal in structure. Aortic valve regurgitation is not visualized. No aortic stenosis is present. Aortic valve mean gradient measures 3.0 mmHg. Aortic valve peak gradient measures 5.7 mmHg. Pulmonic Valve: The pulmonic valve was normal in structure. Pulmonic valve regurgitation is trivial. No evidence of pulmonic stenosis. Aorta: The aortic root is normal in size and structure. Venous: The inferior vena cava is normal in size with greater than 50% respiratory variability, suggesting right atrial pressure of 3 mmHg. IAS/Shunts: No atrial level shunt detected by color flow Doppler.  LEFT VENTRICLE PLAX 2D LVIDd:         3.80 cm Diastology LVIDs:         2.70 cm LV e' medial:    6.53 cm/s LV PW:         1.30 cm LV E/e' medial:  8.6 LV IVS:        1.20 cm LV e' lateral:   10.48 cm/s                        LV E/e' lateral: 5.4  RIGHT VENTRICLE RV S prime:     12.70 cm/s TAPSE (M-mode): 1.6 cm LEFT ATRIUM           Index        RIGHT ATRIUM           Index LA Vol (A4C): 43.4 ml 19.72 ml/m  RA Area:     11.70 cm                                    RA Volume:   23.90 ml  10.86 ml/m  AORTIC VALVE AV Vmax:           119.00 cm/s AV Vmean:          82.700 cm/s AV VTI:            0.176 m AV Peak Grad:      5.7 mmHg AV Mean Grad:      3.0 mmHg LVOT Vmax:         91.00 cm/s LVOT Vmean:        69.300 cm/s LVOT VTI:          0.182 m LVOT/AV VTI ratio: 1.03  AORTA Ao Root diam: 3.30 cm Ao Asc diam:  3.70 cm MITRAL VALVE               TRICUSPID VALVE MV Area (PHT):  2.13 cm    TR Peak grad:   28.1 mmHg MV Decel Time: 356 msec    TR Vmax:        265.00 cm/s MV E velocity: 56.10 cm/s MV A velocity: 77.60 cm/s  SHUNTS MV E/A ratio:  0.72        Systemic VTI: 0.18 m Arvilla Meres MD Electronically signed by Arvilla Meres MD Signature Date/Time: 10/28/2022/1:00:12 PM    Final    US Abdomen Limited RUQ (LIVER/GB)  Result Date: 10/28/2022 CLINICAL DATA:  221910 with elevated liver enzymes. 161096 with right upper quadrant pain. EXAM: ULTRASOUND ABDOMEN LIMITED RIGHT UPPER QUADRANT COMPARISON:  CT without contrast earlier today. FINDINGS: Gallbladder: There is intermediate density sludge almost completely filling the gallbladder lumen. No shadowing stones are seen and no free wall thickening. There was no positive sonographic Murphy's sign or pericholecystic fluid. Common bile duct: Diameter: Within normal  limits measuring 2.7 mm with no intrahepatic biliary prominence. Liver: No focal lesion identified. There is mild generalized increased parenchymal echogenicity consistent with steatosis. Portal vein is patent on color Doppler imaging with normal direction of blood flow towards the liver. Other: None. IMPRESSION: 1. Intermediate density sludge almost completely filling the gallbladder lumen. No shadowing stones are seen and there is no sonographic evidence of acute cholecystitis. 2. Normal caliber bile ducts. 3. Mild hepatic steatosis. Electronically Signed   By: Almira Bar M.D.   On: 10/28/2022 06:35   CT ABDOMEN PELVIS WO CONTRAST  Result Date: 10/28/2022 CLINICAL DATA:  Acute abdominal pain, found down EXAM: CT ABDOMEN AND PELVIS WITHOUT CONTRAST TECHNIQUE: Multidetector CT imaging of the abdomen and pelvis was performed following the standard protocol without IV contrast. Unenhanced CT was performed per clinician order. Lack of IV contrast limits sensitivity and specificity, especially for evaluation of abdominal/pelvic solid viscera. RADIATION DOSE REDUCTION: This  exam was performed according to the departmental dose-optimization program which includes automated exposure control, adjustment of the mA and/or kV according to patient size and/or use of iterative reconstruction technique. COMPARISON:  10/20/2022 FINDINGS: Lower chest: No acute pleural or parenchymal lung disease. Hepatobiliary: High attenuation material within the gallbladder may reflect vicarious excretion of previously administered contrast versus gallbladder sludge. No calcified gallstones or cholecystitis. Unremarkable unenhanced appearance of the liver. Pancreas: Unremarkable unenhanced appearance. Spleen: Unremarkable unenhanced appearance. Adrenals/Urinary Tract: No urinary tract calculi or obstructive uropathy within either kidney. The adrenals and bladder are unremarkable. Stomach/Bowel: No bowel obstruction or ileus. Normal appendix right lower quadrant. No bowel wall thickening or inflammatory change. Vascular/Lymphatic: Aortic atherosclerosis. No enlarged abdominal or pelvic lymph nodes. Reproductive: Stable enlargement of the prostate. No focal abnormality. Other: No free fluid or free intraperitoneal gas. No abdominal wall hernia. Musculoskeletal: No acute or destructive bony abnormalities. Postsurgical changes lower lumbar spine. Reconstructed images demonstrate no additional findings. IMPRESSION: 1. High attenuation material filling the gallbladder lumen, likely vicarious excretion of previously administered contrast. Gallbladder sludge could also give this appearance. No evidence of cholelithiasis or cholecystitis. 2. Otherwise unremarkable unenhanced CT of the abdomen and pelvis. Electronically Signed   By: Sharlet Salina M.D.   On: 10/28/2022 03:22   CT HEAD WO CONTRAST ( )  Result Date: 10/28/2022 CLINICAL DATA:  Syncope EXAM: CT HEAD WITHOUT CONTRAST TECHNIQUE: Contiguous axial images were obtained from the base of the skull through the vertex without intravenous contrast. RADIATION DOSE  REDUCTION: This exam was performed according to the departmental dose-optimization program which includes automated exposure control, adjustment of the mA and/or kV according to patient size and/or use of iterative reconstruction technique. COMPARISON:  No prior CT head available, correlation is made with MRI head 10/01/2017 FINDINGS: Brain: No evidence of acute infarction, hemorrhage, mass, mass effect, or midline shift. No hydrocephalus or extra-axial fluid collection. Gray-white differentiation is preserved. The basilar cisterns are patent. Normal cerebral volume for age. Vascular: No hyperdense vessel. Skull: Negative for fracture or focal lesion. Sinuses/Orbits: No acute finding. Other: The mastoid air cells are well aerated. IMPRESSION: No acute intracranial process. Electronically Signed   By: Wiliam Ke M.D.   On: 10/28/2022 03:18   CT ABDOMEN PELVIS W CONTRAST  Result Date: 10/20/2022 CLINICAL DATA:  Left lower quadrant pain. EXAM: CT ABDOMEN AND PELVIS WITH CONTRAST TECHNIQUE: Multidetector CT imaging of the abdomen and pelvis was performed using the standard protocol following bolus administration of intravenous contrast. RADIATION DOSE REDUCTION: This exam was performed according to the departmental  dose-optimization program which includes automated exposure control, adjustment of the mA and/or kV according to patient size and/or use of iterative reconstruction technique. CONTRAST:  OMNIPAQUE IOHEXOL 300 MG/ML  SOLN COMPARISON:  None Available. FINDINGS: Lower chest: No acute abnormality. Hepatobiliary: No focal liver abnormality is seen. The gallbladder is moderately distended without evidence of gallstones, gallbladder wall thickening or biliary dilatation. Pancreas: Several punctate calcifications are seen within an otherwise normal appearing pancreas. No pancreatic ductal dilatation or surrounding inflammatory changes. Spleen: Normal in size without focal abnormality. Adrenals/Urinary  Tract: Adrenal glands are unremarkable. Kidneys are normal, without renal calculi, focal lesion, or hydronephrosis. Bladder is unremarkable. Stomach/Bowel: Stomach is within normal limits. The appendix is not clearly identified. No evidence of bowel wall thickening, distention, or inflammatory changes. A moderate to marked amount of stool is seen within the ascending colon, proximal to mid transverse colon and mid to distal sigmoid colon. Vascular/Lymphatic: Aortic atherosclerosis. No enlarged abdominal or pelvic lymph nodes. Reproductive: Moderate to marked severity prostate gland enlargement is noted. Other: No abdominal wall hernia or abnormality. No abdominopelvic ascites. Musculoskeletal: Postoperative changes are seen within the mid and lower lumbar spine. IMPRESSION: 1. Moderate to marked severity constipation. 2. Prostatomegaly.  Correlation with PSA levels is recommended. 3. Postoperative changes within the mid and lower lumbar spine. 4. Aortic atherosclerosis. Aortic Atherosclerosis (ICD10-I70.0). Electronically Signed   By: Aram Candela M.D.   On: 10/20/2022 02:13    Microbiology: No results found for this or any previous visit (from the past 240 hour(s)).   Labs: Basic Metabolic Panel: Recent Labs  Lab 10/27/22 2226 10/28/22 0425 10/30/22 0806 10/31/22 0817 11/01/22 1038 11/02/22 0347 11/03/22 0355  NA 132*   < > 132* 135 137 136 135  K 4.0   < > 3.4* 3.4* 3.1* 3.9 3.8  CL 95*   < > 102 107 108 105 104  CO2 22   < > 20* 22 23 21* 21*  GLUCOSE 128*   < > 73 68* 128* 72 67*  BUN 51*   < > 20 16 12 9 9   CREATININE 2.46*   < > 1.03 0.95 0.84 0.87 1.01  CALCIUM 10.0   < > 9.0 8.9 9.2 9.2 9.9  MG 2.1  --   --  1.7 1.8 2.1 2.0  PHOS  --   --   --  2.6  --   --  2.6   < > = values in this interval not displayed.   Liver Function Tests: Recent Labs  Lab 10/28/22 0425 10/29/22 0727 10/30/22 0806 10/31/22 0817 11/01/22 1038 11/03/22 0355  AST 87* 79* 63* 56* 53*  --   ALT  135* 119* 103* 96* 93*  --   ALKPHOS 104 91 84 86 81  --   BILITOT 1.4* 1.4* 0.9 0.7 0.6  --   PROT 6.1* 5.5* 5.2* 4.9* 5.0*  --   ALBUMIN 3.0* 2.8* 2.5* 2.4* 2.4* 3.3*   Recent Labs  Lab 10/29/22 1420  LIPASE 42   No results for input(s): "AMMONIA" in the last 168 hours. CBC: Recent Labs  Lab 10/27/22 2226 10/28/22 0425 10/30/22 0806 10/31/22 0817 11/01/22 1038 11/02/22 0347 11/03/22 0355  WBC 11.6*   < > 5.8 5.8 6.1 6.8 6.8  NEUTROABS 8.6*  --   --   --   --   --   --   HGB 11.6*   < > 8.4* 8.0* 8.1* 8.7* 8.9*  HCT 35.9*   < >  25.4* 24.4* 24.3* 26.2* 26.7*  MCV 89.5   < > 89.1 88.7 90.7 87.9 88.4  PLT 260   < > 216 222 221 255 283   < > = values in this interval not displayed.   Cardiac Enzymes: Recent Labs  Lab 10/27/22 2226 10/29/22 1420  CKTOTAL 294 155   BNP: BNP (last 3 results) No results for input(s): "BNP" in the last 8760 hours.  ProBNP (last 3 results) No results for input(s): "PROBNP" in the last 8760 hours.  CBG: No results for input(s): "GLUCAP" in the last 168 hours.     Signed:  Ramiro Harvest MD.  [facility] 11/03/2022, 2:14 PM

## 2022-11-03 NOTE — TOC Transition Note (Signed)
Transition of Care [facility]) - CM/SW Discharge Note   Patient Details  Name: Brandon [name] MRN: [id number] Date of Birth: May 25, 1951  Transition of Care [facility]) CM/SW Contact:  Ronny Bacon, RN Phone Number: 11/03/2022, 2:39 PM   Clinical Narrative:   Spoke with patient at bedside. Patient lives with brother in 2 story home, 1 story being the basement. Patient confirms able to ambulate without assistance. Confirmed with patient, PCP is Dr. Tanya Nones and uses [facility] in [place]. Family is able to transport patient to medical appointments as needed.  [facility] aware of patient being discharged today and the need for [facility] PT/OT/Aide. Kandee Keen confirms able to supply services to patient.  Patient is in process of calling family for ride home.     Final next level of care: Home w Home Health Services Barriers to Discharge: No Barriers Identified   Patient Goals and CMS Choice CMS Medicare.gov Compare Post Acute Care list provided to:: Patient Choice offered to / list presented to : Patient  Discharge Placement                         Discharge Plan and Services Additional resources added to the After Visit Summary for     Discharge Planning Services: CM Consult Post Acute Care Choice: Home Health                    HH Arranged: PT, OT, Nurse's Aide HH Agency: [facility] Date [facility] Agency Contacted: 11/03/22 Time HH Agency Contacted: 1412 Representative spoke with at [facility] Agency: Kandee Keen  Social Determinants of Health (SDOH) Interventions SDOH Screenings   Food Insecurity: No Food Insecurity (10/28/2022)  Housing: Low Risk  (10/28/2022)  Transportation Needs: No Transportation Needs (10/28/2022)  Utilities: Not At Risk (10/28/2022)  Alcohol Screen: Low Risk  (11/23/2021)  Depression (PHQ2-9): Low Risk  (11/23/2021)  Financial Resource Strain: Low Risk  (04/08/2018)  Physical Activity: Insufficiently Active (11/23/2021)  Social Connections: Socially  Integrated (11/23/2021)  Stress: No Stress Concern Present (11/23/2021)  Tobacco Use: High Risk (10/28/2022)     Readmission Risk Interventions     No data to display

## 2022-11-05 ENCOUNTER — Telehealth: Payer: Self-pay

## 2022-11-05 NOTE — Transitions of Care (Post Inpatient/ED Visit) (Signed)
   11/05/2022  Name: Brandon [name] MRN: [id number] DOB: 08-03-1950  Today's TOC FU Call Status: Today's TOC FU Call Status:: Unsuccessul Call (1st Attempt) Unsuccessful Call (1st Attempt) Date: 11/05/22  Attempted to reach the patient regarding the most recent Inpatient/ED visit.  Follow Up Plan: Additional outreach attempts will be made to reach the patient to complete the Transitions of Care (Post Inpatient/ED visit) call.   Jodelle Gross, RN, BSN, CCM Care Management Coordinator [place]/[facility]

## 2022-11-06 ENCOUNTER — Telehealth: Payer: Self-pay

## 2022-11-06 NOTE — Transitions of Care (Post Inpatient/ED Visit) (Signed)
   11/06/2022  Name: MELBOURNE JAKUBIAK MRN: [id number] DOB: 1950-06-13  Today's TOC FU Call Status: Today's TOC FU Call Status:: Unsuccessful Call (2nd Attempt) Unsuccessful Call (2nd Attempt) Date: 11/06/22  Attempted to reach the patient regarding the most recent Inpatient/ED visit.  Follow Up Plan: Additional outreach attempts will be made to reach the patient to complete the Transitions of Care (Post Inpatient/ED visit) call.   Jodelle Gross, RN, BSN, CCM Care Management Coordinator [place]/[facility]

## 2022-11-07 ENCOUNTER — Telehealth: Payer: Self-pay

## 2022-11-07 NOTE — Telephone Encounter (Signed)
Marylene Land RN with [facility] called in wanting to speak with nurse about pt's referral. Marylene Land states that there would be a delay in pt's care which is due to start on 11/11/22 per pt's request. RN would like a cb from nurse to discuss this change please.  Cb# Marylene Land RN/[facility] [phone]

## 2022-11-07 NOTE — Transitions of Care (Post Inpatient/ED Visit) (Signed)
   11/07/2022  Name: TYEE VANDEVOORDE MRN: [id number] DOB: Jun 24, 1950  Today's TOC FU Call Status: Today's TOC FU Call Status:: Unsuccessful Call (3rd Attempt) Unsuccessful Call (3rd Attempt) Date: 11/07/22  Attempted to reach the patient regarding the most recent Inpatient/ED visit.  Follow Up Plan: No further outreach attempts will be made at this time. We have been unable to contact the patient.  Jodelle Gross, RN, BSN, CCM Care Management Coordinator /[facility] Phone: [phone]/Fax: [phone]

## 2022-11-11 ENCOUNTER — Encounter: Payer: Self-pay | Admitting: Family Medicine

## 2022-11-11 ENCOUNTER — Other Ambulatory Visit: Payer: Self-pay

## 2022-11-11 ENCOUNTER — Other Ambulatory Visit: Payer: Self-pay | Admitting: Family Medicine

## 2022-11-11 ENCOUNTER — Ambulatory Visit (INDEPENDENT_AMBULATORY_CARE_PROVIDER_SITE_OTHER): Payer: Medicare HMO | Admitting: Family Medicine

## 2022-11-11 VITALS — HR 81 | Temp 97.5°F | Ht 76.0 in | Wt 176.8 lb

## 2022-11-11 DIAGNOSIS — D62 Acute posthemorrhagic anemia: Secondary | ICD-10-CM | POA: Diagnosis not present

## 2022-11-11 DIAGNOSIS — I700 Atherosclerosis of aorta: Secondary | ICD-10-CM | POA: Diagnosis not present

## 2022-11-11 DIAGNOSIS — N400 Enlarged prostate without lower urinary tract symptoms: Secondary | ICD-10-CM | POA: Diagnosis not present

## 2022-11-11 DIAGNOSIS — D500 Iron deficiency anemia secondary to blood loss (chronic): Secondary | ICD-10-CM | POA: Diagnosis not present

## 2022-11-11 DIAGNOSIS — I951 Orthostatic hypotension: Secondary | ICD-10-CM | POA: Diagnosis not present

## 2022-11-11 DIAGNOSIS — Z87891 Personal history of nicotine dependence: Secondary | ICD-10-CM

## 2022-11-11 DIAGNOSIS — Z122 Encounter for screening for malignant neoplasm of respiratory organs: Secondary | ICD-10-CM

## 2022-11-11 DIAGNOSIS — I119 Hypertensive heart disease without heart failure: Secondary | ICD-10-CM | POA: Diagnosis not present

## 2022-11-11 DIAGNOSIS — Z9181 History of falling: Secondary | ICD-10-CM | POA: Diagnosis not present

## 2022-11-11 DIAGNOSIS — B9681 Helicobacter pylori [H. pylori] as the cause of diseases classified elsewhere: Secondary | ICD-10-CM | POA: Diagnosis not present

## 2022-11-11 DIAGNOSIS — M5136 Other intervertebral disc degeneration, lumbar region: Secondary | ICD-10-CM | POA: Diagnosis not present

## 2022-11-11 DIAGNOSIS — E876 Hypokalemia: Secondary | ICD-10-CM | POA: Diagnosis not present

## 2022-11-11 DIAGNOSIS — K921 Melena: Secondary | ICD-10-CM | POA: Diagnosis not present

## 2022-11-11 DIAGNOSIS — K644 Residual hemorrhoidal skin tags: Secondary | ICD-10-CM | POA: Diagnosis not present

## 2022-11-11 DIAGNOSIS — K269 Duodenal ulcer, unspecified as acute or chronic, without hemorrhage or perforation: Secondary | ICD-10-CM | POA: Diagnosis not present

## 2022-11-11 DIAGNOSIS — D649 Anemia, unspecified: Secondary | ICD-10-CM | POA: Diagnosis not present

## 2022-11-11 DIAGNOSIS — K760 Fatty (change of) liver, not elsewhere classified: Secondary | ICD-10-CM | POA: Diagnosis not present

## 2022-11-11 DIAGNOSIS — E860 Dehydration: Secondary | ICD-10-CM | POA: Diagnosis not present

## 2022-11-11 DIAGNOSIS — K648 Other hemorrhoids: Secondary | ICD-10-CM | POA: Diagnosis not present

## 2022-11-11 LAB — HEMOGLOBIN, FINGERSTICK: POC HEMOGLOBIN: 9.9 g/dL — ABNORMAL LOW (ref 13.0–17.0)

## 2022-11-11 NOTE — Progress Notes (Signed)
Subjective:    Patient ID: Brandon [name], male    DOB: Jan 09, 1951, 72 y.o.   MRN: [id number] 08/30/22 My plan at the last visit was: Patient's blood pressure looks excellent.  However I am concerned by his fatigue.  I will check a CBC a CMP a TSH and a B12.  If labs are normal I have recommended a colonoscopy to evaluate the source of the bright red blood per rectum.  Physical exam today is normal  Labs 4/5 were normal including cbc, cmp, TSH, B12. Hgb was 14.6 in late May but was down to the mid 8 range at the hospital earlier this month!  Was having abdominal pain and CT abd and pelvis normal except for sludge in gall bladder which was confirmed on RUQ [place].   11/11/22 Patient is here today for hide follow-up renal care is more different from the last time I saw the patient in April.  Previously he was walking and doing well.  He is extremely weak.  He is hypotensive and orthostatic although he is still taking losartan.  He is not taking any aspirin or blood thinners.  He has not had a bowel movement in several days despite taking MiraLAX.  He is barely able to eat or drink anything due to the symptoms.  His family member states that his stools were melanotic    Past Medical History:  Diagnosis Date   Hypertension    Syphilis    Past Surgical History:  Procedure Laterality Date   BACK SURGERY     4 yrs ago   COLONOSCOPY  01/22/2012   Procedure: COLONOSCOPY;  Surgeon: Malissa Hippo, MD;  [facility];  Service: Endoscopy;  Laterality: N/A;  200   Current Outpatient Medications on File Prior to Visit  Medication Sig Dispense Refill   feeding supplement (ENSURE ENLIVE / ENSURE PLUS) LIQD Take 237 mLs by mouth 2 (two) times daily between meals. 237 mL 12   losartan (COZAAR) 50 MG tablet Take 1 tablet (50 mg total) by mouth daily. 90 tablet 0   megestrol (MEGACE) 400 MG/10ML suspension Take 10 mLs (400 mg total) by mouth daily. 480 mL 1   multivitamin (RENA-VIT) TABS tablet  Take 1 tablet by mouth at bedtime.  0   ondansetron (ZOFRAN-ODT) 8 MG disintegrating tablet Take 1 tablet (8 mg total) by mouth every 8 (eight) hours as needed for nausea or vomiting. 20 tablet 0   polyethylene glycol (MIRALAX / GLYCOLAX) 17 g packet Take 17 g by mouth daily as needed for mild constipation.     No current facility-administered medications on file prior to visit.   No Known Allergies Social History   Socioeconomic History   Marital status: Legally Separated    Spouse name: Not on file   Number of children: 3   Years of education: 12   Highest education level: High school graduate  Occupational History   Occupation: Retired- IT trainer  Tobacco Use   Smoking status: Every Day    Packs/day: 0.50    Years: 45.00    Additional pack years: 0.00    Total pack years: 22.50    Types: Cigarettes   Smokeless tobacco: Never  Vaping Use   Vaping Use: Never used  Substance and Sexual Activity   Alcohol use: No   Drug use: No   Sexual activity: Yes    Birth control/protection: None  Other Topics Concern   Not on file  Social History Narrative  Grew up in [place], [place]. 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    Married for 17 years. Has 3 children, 11 grandchildren, 4 great grandchildren.   Wife had breast cancer and she died eight years ago. Remarried, wife is younger.    Wears seatbelt.   Eats all food groups.    Drinks 3-4 sodas per day.   Right-handed.   Social Determinants of Health   Financial Resource Strain: Low Risk  (04/08/2018)   Overall Financial Resource Strain (CARDIA)    Difficulty of Paying Living Expenses: Not hard at all  Food Insecurity: No Food Insecurity (10/28/2022)   Hunger Vital Sign    Worried About Running Out of Food in the Last Year: Never true    Ran Out of Food in the Last Year: Never true  Transportation Needs: No Transportation Needs (10/28/2022)   PRAPARE - Administrator, Civil Service (Medical): No    Lack of  Transportation (Non-Medical): No  Physical Activity: Insufficiently Active (11/23/2021)   Exercise Vital Sign    Days of Exercise per Week: 2 days    Minutes of Exercise per Session: 60 min  Stress: No Stress Concern Present (11/23/2021)   [employer] of Occupational Health - Occupational Stress Questionnaire    Feeling of Stress : Not at all  Social Connections: Socially Integrated (11/23/2021)   Social Connection and Isolation Panel [NHANES]    Frequency of Communication with Friends and Family: More than three times a week    Frequency of Social Gatherings with Friends and Family: More than three times a week    Attends Religious Services: More than 4 times per year    Active Member of [employer] or Organizations: Yes    Attends Banker Meetings: More than 4 times per year    Marital Status: Married  Catering manager Violence: Not At Risk (10/28/2022)   Humiliation, Afraid, Rape, and Kick questionnaire    Fear of Current or Ex-Partner: No    Emotionally Abused: No    Physically Abused: No    Sexually Abused: No      Review of Systems  Gastrointestinal:  Positive for diarrhea.  All other systems reviewed and are negative.      Objective:   Physical Exam Vitals reviewed.  Constitutional:      General: He is not in acute distress.    Appearance: Normal appearance. He is normal weight. He is ill-appearing. He is not toxic-appearing or diaphoretic.  HENT:     Head: Normocephalic and atraumatic.  Cardiovascular:     Rate and Rhythm: Regular rhythm. Tachycardia present.     Pulses: Normal pulses.     Heart sounds: Normal heart sounds. No murmur heard.    No friction rub. No gallop.  Pulmonary:     Effort: Pulmonary effort is normal. No respiratory distress.     Breath sounds: Normal breath sounds. No stridor. No wheezing or rhonchi.  Abdominal:     General: There is no distension.     Palpations: Abdomen is soft. There is no mass.     Tenderness: There is  abdominal tenderness. There is no guarding or rebound.     Hernia: No hernia is present.  Genitourinary:    Prostate: Normal.     Rectum: Normal.  Musculoskeletal:     Right lower leg: No edema.     Left lower leg: No edema.  Skin:    Coloration: Skin is not jaundiced or  pale.     Findings: No bruising, erythema, lesion or rash.  Neurological:     General: No focal deficit present.     Mental Status: He is alert and oriented to person, place, and time.     Cranial Nerves: No cranial nerve deficit.     Sensory: No sensory deficit.     Motor: No weakness.     Coordination: Coordination normal.     Gait: Gait normal.     Deep Tendon Reflexes: Reflexes normal.           Assessment & Plan:  Iron deficiency anemia due to chronic blood loss - Plan: Hemoglobin, fingerstick  Melena Patient presents today with fatigue.  He is dehydrated and hypotensive.  His hemoglobin has dropped from 14-8 in less than a month.  He is having melanotic stools.  I believe the patient has a GI bleed from an unknown source and this is the cause of his fatigue and his anemia.  I will order a GI consult staff.  Patient needs EGD and colonoscopy as soon as possible.  If worsening, he needs to go to the emergency room.  Fingerstick hemoglobin today is back to 9.9 which shows stability although he appears dehydrated.  Discontinue losartan and begin ferrous sulfate 325 mg twice daily.  Push fluids.

## 2022-11-12 ENCOUNTER — Inpatient Hospital Stay (HOSPITAL_COMMUNITY)
Admission: EM | Admit: 2022-11-12 | Discharge: 2022-11-16 | DRG: 378 | Disposition: A | Discharge status: Home Health | Payer: Medicare HMO | Attending: Internal Medicine | Admitting: Internal Medicine

## 2022-11-12 ENCOUNTER — Telehealth: Payer: Self-pay

## 2022-11-12 ENCOUNTER — Encounter (HOSPITAL_COMMUNITY): Payer: Self-pay

## 2022-11-12 ENCOUNTER — Other Ambulatory Visit: Payer: Self-pay

## 2022-11-12 DIAGNOSIS — K922 Gastrointestinal hemorrhage, unspecified: Secondary | ICD-10-CM | POA: Diagnosis not present

## 2022-11-12 DIAGNOSIS — K2970 Gastritis, unspecified, without bleeding: Secondary | ICD-10-CM | POA: Diagnosis present

## 2022-11-12 DIAGNOSIS — K921 Melena: Principal | ICD-10-CM

## 2022-11-12 DIAGNOSIS — F17210 Nicotine dependence, cigarettes, uncomplicated: Secondary | ICD-10-CM | POA: Diagnosis present

## 2022-11-12 DIAGNOSIS — Z8249 Family history of ischemic heart disease and other diseases of the circulatory system: Secondary | ICD-10-CM

## 2022-11-12 DIAGNOSIS — K5900 Constipation, unspecified: Secondary | ICD-10-CM | POA: Diagnosis present

## 2022-11-12 DIAGNOSIS — K264 Chronic or unspecified duodenal ulcer with hemorrhage: Principal | ICD-10-CM | POA: Diagnosis present

## 2022-11-12 DIAGNOSIS — Z803 Family history of malignant neoplasm of breast: Secondary | ICD-10-CM

## 2022-11-12 DIAGNOSIS — K3189 Other diseases of stomach and duodenum: Secondary | ICD-10-CM | POA: Diagnosis present

## 2022-11-12 DIAGNOSIS — I10 Essential (primary) hypertension: Secondary | ICD-10-CM | POA: Diagnosis present

## 2022-11-12 DIAGNOSIS — A048 Other specified bacterial intestinal infections: Secondary | ICD-10-CM | POA: Diagnosis present

## 2022-11-12 DIAGNOSIS — D62 Acute posthemorrhagic anemia: Secondary | ICD-10-CM | POA: Diagnosis present

## 2022-11-12 DIAGNOSIS — E860 Dehydration: Secondary | ICD-10-CM | POA: Diagnosis present

## 2022-11-12 DIAGNOSIS — Z79899 Other long term (current) drug therapy: Secondary | ICD-10-CM

## 2022-11-12 DIAGNOSIS — I951 Orthostatic hypotension: Secondary | ICD-10-CM | POA: Diagnosis present

## 2022-11-12 DIAGNOSIS — B9681 Helicobacter pylori [H. pylori] as the cause of diseases classified elsewhere: Secondary | ICD-10-CM | POA: Diagnosis present

## 2022-11-12 DIAGNOSIS — Z833 Family history of diabetes mellitus: Secondary | ICD-10-CM

## 2022-11-12 DIAGNOSIS — Z811 Family history of alcohol abuse and dependence: Secondary | ICD-10-CM

## 2022-11-12 LAB — COMPLETE METABOLIC PANEL WITH GFR
AG Ratio: 1.2 (calc) (ref 1.0–2.5)
ALT: 112 U/L — ABNORMAL HIGH (ref 9–46)
AST: 43 U/L — ABNORMAL HIGH (ref 10–35)
Albumin: 4 g/dL (ref 3.6–5.1)
Alkaline phosphatase (APISO): 101 U/L (ref 35–144)
BUN: 22 mg/dL (ref 7–25)
CO2: 24 mmol/L (ref 20–32)
Calcium: 10.5 mg/dL — ABNORMAL HIGH (ref 8.6–10.3)
Chloride: 102 mmol/L (ref 98–110)
Creat: 0.91 mg/dL (ref 0.70–1.35)
Globulin: 3.4 g/dL (calc) (ref 1.9–3.7)
Glucose, Bld: 136 mg/dL — ABNORMAL HIGH (ref 65–99)
Potassium: 4.5 mmol/L (ref 3.5–5.3)
Sodium: 138 mmol/L (ref 135–146)
Total Bilirubin: 0.4 mg/dL (ref 0.2–1.2)
Total Protein: 7.4 g/dL (ref 6.1–8.1)
eGFR: 91 mL/min/{1.73_m2} (ref >=60)

## 2022-11-12 LAB — CBC
HCT: 34.1 % — ABNORMAL LOW (ref 39.0–52.0)
Hemoglobin: 10.8 g/dL — ABNORMAL LOW (ref 13.0–17.0)
MCH: 28.9 pg (ref 26.0–34.0)
MCHC: 31.7 g/dL (ref 30.0–36.0)
MCV: 91.2 fL (ref 80.0–100.0)
Platelets: 559 10*3/uL — ABNORMAL HIGH (ref 150–400)
RBC: 3.74 MIL/uL — ABNORMAL LOW (ref 4.22–5.81)
RDW: 13.9 % (ref 11.5–15.5)
WBC: 7.2 10*3/uL (ref 4.0–10.5)
nRBC: 0 % (ref 0.0–0.2)

## 2022-11-12 LAB — TYPE AND SCREEN
ABO/RH(D): O POS
Antibody Screen: NEGATIVE

## 2022-11-12 LAB — COMPREHENSIVE METABOLIC PANEL
ALT: 116 U/L — ABNORMAL HIGH (ref 0–44)
AST: 53 U/L — ABNORMAL HIGH (ref 15–41)
Albumin: 3.8 g/dL (ref 3.5–5.0)
Alkaline Phosphatase: 97 U/L (ref 38–126)
Anion gap: 11 (ref 5–15)
BUN: 25 mg/dL — ABNORMAL HIGH (ref 8–23)
CO2: 19 mmol/L — ABNORMAL LOW (ref 22–32)
Calcium: 10.1 mg/dL (ref 8.9–10.3)
Chloride: 104 mmol/L (ref 98–111)
Creatinine, Ser: 1.07 mg/dL (ref 0.61–1.24)
GFR, Estimated: >60 mL/min (ref >60)
Glucose, Bld: 122 mg/dL — ABNORMAL HIGH (ref 70–99)
Potassium: 3.7 mmol/L (ref 3.5–5.1)
Sodium: 134 mmol/L — ABNORMAL LOW (ref 135–145)
Total Bilirubin: 0.5 mg/dL (ref 0.3–1.2)
Total Protein: 8.3 g/dL — ABNORMAL HIGH (ref 6.5–8.1)

## 2022-11-12 LAB — CBC WITH DIFFERENTIAL/PLATELET
Absolute Monocytes: 390 cells/uL (ref 200–950)
Basophils Absolute: 18 cells/uL (ref 0–200)
Basophils Relative: 0.3 %
Eosinophils Absolute: 12 cells/uL — ABNORMAL LOW (ref 15–500)
Eosinophils Relative: 0.2 %
HCT: 32.7 % — ABNORMAL LOW (ref 38.5–50.0)
Hemoglobin: 10.4 g/dL — ABNORMAL LOW (ref 13.2–17.1)
Lymphs Abs: 1452 cells/uL (ref 850–3900)
MCH: 28.9 pg (ref 27.0–33.0)
MCHC: 31.8 g/dL — ABNORMAL LOW (ref 32.0–36.0)
MCV: 90.8 fL (ref 80.0–100.0)
MPV: 9 fL (ref 7.5–12.5)
Monocytes Relative: 6.4 %
Neutro Abs: 4227 cells/uL (ref 1500–7800)
Neutrophils Relative %: 69.3 %
Platelets: 536 10*3/uL — ABNORMAL HIGH (ref 140–400)
RBC: 3.6 10*6/uL — ABNORMAL LOW (ref 4.20–5.80)
RDW: 13.3 % (ref 11.0–15.0)
Total Lymphocyte: 23.8 %
WBC: 6.1 10*3/uL (ref 3.8–10.8)

## 2022-11-12 LAB — PROTIME-INR
INR: 1.1 (ref 0.8–1.2)
Prothrombin Time: 14.4 seconds (ref 11.4–15.2)

## 2022-11-12 LAB — POC OCCULT BLOOD, ED: Fecal Occult Bld: POSITIVE — AB

## 2022-11-12 LAB — HEMOGLOBIN AND HEMATOCRIT, BLOOD
HCT: 34.2 % — ABNORMAL LOW (ref 39.0–52.0)
Hemoglobin: 10.7 g/dL — ABNORMAL LOW (ref 13.0–17.0)

## 2022-11-12 MED ORDER — TRAZODONE HCL 50 MG PO TABS: 25.0000 mg | ORAL_TABLET | Freq: Every evening | ORAL | Status: DC | PRN

## 2022-11-12 MED ORDER — METOPROLOL TARTRATE 5 MG/5ML IV SOLN: 5.0000 mg | Freq: Four times a day (QID) | INTRAVENOUS | Status: DC | PRN

## 2022-11-12 MED ORDER — ONDANSETRON HCL 4 MG/2ML IJ SOLN: 4.0000 mg | Freq: Four times a day (QID) | INTRAMUSCULAR | Status: DC | PRN

## 2022-11-12 MED ORDER — ALBUTEROL SULFATE (2.5 MG/3ML) 0.083% IN NEBU: 2.5000 mg | INHALATION_SOLUTION | RESPIRATORY_TRACT | Status: DC | PRN

## 2022-11-12 MED ORDER — ACETAMINOPHEN 325 MG PO TABS: 650.0000 mg | ORAL_TABLET | Freq: Four times a day (QID) | ORAL | Status: DC | PRN

## 2022-11-12 MED ORDER — PANTOPRAZOLE 80MG IVPB - SIMPLE MED
80.0000 mg | Freq: Once | INTRAVENOUS | Status: AC
  Administered 2022-11-12: 80 mg via INTRAVENOUS
  Filled 2022-11-12: qty 80

## 2022-11-12 MED ORDER — ACETAMINOPHEN 650 MG RE SUPP: 650.0000 mg | Freq: Four times a day (QID) | RECTAL | Status: DC | PRN

## 2022-11-12 MED ORDER — ONDANSETRON HCL 4 MG PO TABS: 4.0000 mg | ORAL_TABLET | Freq: Four times a day (QID) | ORAL | Status: DC | PRN

## 2022-11-12 NOTE — Telephone Encounter (Signed)
Pt's sister, Lisette Abu, called and states Mr. Brandon [name] had a large, black, BM in the bed, very weak. [name] states they are getting him cleaned up and taking him to [facility] ER. She wanted to make sure you are aware. Thank you.

## 2022-11-12 NOTE — H&P (Signed)
History and Physical  Brandon [name] ZOX:[id number] DOB: 07-Jan-1951 DOA: 11/12/2022  PCP: Donita Brooks, MD   Chief Complaint: Dark Stools and Low Hb   HPI: Brandon [name] is a 72 y.o. male with medical history significant for hypertension on losartan recent hospital stay due to syncope of unclear etiology who is being admitted to the hospital with acute anemia and melena, concerning for upper GI bleeding.  As result of her recent hospital stay after which she was discharged on 6/9, he had an outpatient follow-up yesterday with his primary care physician.  During that visit it is documented that the patient presented with fatigue, dehydrated, and relatively hypotensive.  Yesterday he did complain of having melanotic stools, repeat fingerstick hemoglobin was done in the office it was 9.9.  PCP discontinued losartan, started ferrous sulfate supplementation twice daily.  This morning patient had a large dark BM was very weak, and family brought him to [facility], ER.  ED Course: In the emergency department vital signs were normal, with heart rate 89, blood pressure 127/85.  Lab work was done, which showed hemoglobin of 10.8, lab work otherwise relatively unremarkable save for AST 53 and ALT 116.  Patient was started on Protonix drip, ER provider discussed with Dr. Levora Angel of GI who will consult, and requested hospitalist admission.  Review of Systems: Please see HPI for pertinent positives and negatives. A complete 10 system review of systems are otherwise negative.  Past Medical History:  Diagnosis Date   Hypertension    Syphilis    Past Surgical History:  Procedure Laterality Date   BACK SURGERY     4 yrs ago   COLONOSCOPY  01/22/2012   Procedure: COLONOSCOPY;  Surgeon: Malissa Hippo, MD;  [facility];  Service: Endoscopy;  Laterality: N/A;  200    Social History:  reports that he has been smoking cigarettes. He has a 22.50 pack-year smoking history. He has  never used smokeless tobacco. He reports that he does not drink alcohol and does not use drugs.   No Known Allergies  Family History  Problem Relation Age of Onset   Diabetes Mother    Hypertension Mother    Breast cancer Mother    Alcohol abuse Father    Aneurysm Father    Breast cancer Daughter    Colon cancer Neg Hx      Prior to Admission medications   Medication Sig Start Date End Date Taking? Authorizing Provider  feeding supplement (ENSURE ENLIVE / ENSURE PLUS) LIQD Take 237 mLs by mouth 2 (two) times daily between meals. 11/04/22   [name], MD  losartan (COZAAR) 50 MG tablet Take 1 tablet (50 mg total) by mouth daily. 11/08/22   [name], MD  megestrol (MEGACE) 400 MG/10ML suspension Take 10 mLs (400 mg total) by mouth daily. 11/04/22   [name], MD  multivitamin (RENA-VIT) TABS tablet Take 1 tablet by mouth at bedtime. 11/03/22   [name], MD  ondansetron (ZOFRAN-ODT) 8 MG disintegrating tablet Take 1 tablet (8 mg total) by mouth every 8 (eight) hours as needed for nausea or vomiting. 10/20/22   Dione Booze, MD  polyethylene glycol (MIRALAX / GLYCOLAX) 17 g packet Take 17 g by mouth daily as needed for mild constipation. 11/03/22   [name], MD    Physical Exam: BP 127/83   Pulse 75   Temp 98 F (36.7 C)   Resp (!) 22   Ht 6'  4" (1.93 m)   Wt 79.8 kg   SpO2 100%   BMI 21.42 kg/m   General:  Alert, oriented, calm, in no acute distress  Eyes: EOMI, clear conjuctivae, white sclerea Neck: supple, no masses, trachea mildline  Cardiovascular: RRR, no murmurs or rubs, no peripheral edema  Respiratory: clear to auscultation bilaterally, no wheezes, no crackles  Abdomen: soft, nontender, nondistended, normal bowel tones heard  Skin: dry, no rashes  Musculoskeletal: no joint effusions, normal range of motion  Psychiatric: appropriate affect, normal speech  Neurologic: extraocular muscles intact, clear speech, moving all  extremities with intact sensorium          Labs on Admission:  Basic Metabolic Panel: Recent Labs  Lab 11/11/22 1455 11/12/22 1627  NA 138 134*  K 4.5 3.7  CL 102 104  CO2 24 19*  GLUCOSE 136* 122*  BUN 22 25*  CREATININE 0.91 1.07  CALCIUM 10.5* 10.1   Liver Function Tests: Recent Labs  Lab 11/11/22 1455 11/12/22 1627  AST 43* 53*  ALT 112* 116*  ALKPHOS  --  97  BILITOT 0.4 0.5  PROT 7.4 8.3*  ALBUMIN  --  3.8   No results for input(s): "LIPASE", "AMYLASE" in the last 168 hours. No results for input(s): "AMMONIA" in the last 168 hours. CBC: Recent Labs  Lab 11/11/22 1455 11/12/22 1627  WBC 6.1 7.2  NEUTROABS 4,227  --   HGB 10.4* 10.8*  HCT 32.7* 34.1*  MCV 90.8 91.2  PLT 536* 559*   Cardiac Enzymes: No results for input(s): "CKTOTAL", "CKMB", "CKMBINDEX", "TROPONINI" in the last 168 hours.  BNP (last 3 results) No results for input(s): "BNP" in the last 8760 hours.  ProBNP (last 3 results) No results for input(s): "PROBNP" in the last 8760 hours.  CBG: No results for input(s): "GLUCAP" in the last 168 hours.  Radiological Exams on Admission: No results found.  Assessment/Plan This is a pleasant 72 year old gentleman with a history of hypertension and syphilis being admitted to the hospital with melena, symptomatic anemia concerning for upper GI bleeding.  He does have a history of frequent ibuprofen use due to his chronic back pain, has never had upper endoscopy or colonoscopy.  Upper GI bleed-suspected due to melanotic stools, symptomatic anemia -Observation admission to progressive -Trend hemoglobin every 8 hours -Plan to transfuse to keep hemoglobin greater than 7 - Will give clear liquid diet now, and keep n.p.o. after midnight -GI consulted -IV PPI drip -Avoid blood thinners  Acute blood loss anemia-as above  Hypertension-normotensive currently, hold losartan, IV metoprolol as needed  DVT prophylaxis: SCDs     Code Status: Full  Code  Consults called: ER provider discussed with Dr. Levora Angel who will consult  Admission status: Observation   Time spent: 51 minutes  Zyanna Leisinger Sharlette Dense MD [facility] Pager [phone]  If 7PM-7AM, please contact night-coverage [email or web address] Password [facility]  11/12/2022, 7:52 PM

## 2022-11-12 NOTE — ED Provider Notes (Signed)
[place] EMERGENCY DEPARTMENT AT [facility] Provider Note   CSN: [id number] Arrival date & time: 11/12/22  1502     History  Chief Complaint  Patient presents with   Rectal Bleeding    Brandon [name] is a 72 y.o. male.  HPI    Brandon [name] is a 72 year old male with a history of syphilis and HTN who presents today due to rectal bleeding. He saw his PCP yesterday who suggested he come to the ED due to hypotension, dehydration, and a Hemoglobin of 8. He reports dark tarry stools since mother's day last month as well as abdominal pain. He also reports increased fatigue. He says that he has been constipated for the last month as well, but he . He endorses taking Ibuprofen frequently for years to address his back pain. He also endorses a history of a stomach ulcer that he did not have formally evaluated. Denies alcohol use.   Of note, patient was admitted on 6-3, when he had come into the emergency room because of syncope.  It was noted that his hemoglobin was lower than baseline, but had stabilized.  He had CT scans during that visit but no GI consult.  Patient denies any history of colonoscopy. Home Medications Prior to Admission medications   Medication Sig Start Date End Date Taking? Authorizing Provider  feeding supplement (ENSURE ENLIVE / ENSURE PLUS) LIQD Take 237 mLs by mouth 2 (two) times daily between meals. 11/04/22   [name], MD  losartan (COZAAR) 50 MG tablet Take 1 tablet (50 mg total) by mouth daily. 11/08/22   [name], MD  megestrol (MEGACE) 400 MG/10ML suspension Take 10 mLs (400 mg total) by mouth daily. 11/04/22   [name], MD  multivitamin (RENA-VIT) TABS tablet Take 1 tablet by mouth at bedtime. 11/03/22   [name], MD  ondansetron (ZOFRAN-ODT) 8 MG disintegrating tablet Take 1 tablet (8 mg total) by mouth every 8 (eight) hours as needed for nausea or vomiting. 10/20/22   Dione Booze, MD  polyethylene glycol  (MIRALAX / GLYCOLAX) 17 g packet Take 17 g by mouth daily as needed for mild constipation. 11/03/22   [name], MD      Allergies    Patient has no known allergies.    Review of Systems   Review of Systems  All other systems reviewed and are negative.   Physical Exam Updated Vital Signs BP 127/83   Pulse 75   Temp 98 F (36.7 C)   Resp (!) 22   Ht 6\' 4"  (1.93 m)   Wt 79.8 kg   SpO2 100%   BMI 21.42 kg/m  Physical Exam Vitals and nursing note reviewed.  Constitutional:      Appearance: He is well-developed.  HENT:     Head: Atraumatic.  Eyes:     Extraocular Movements: Extraocular movements intact.     Pupils: Pupils are equal, round, and reactive to light.  Cardiovascular:     Rate and Rhythm: Normal rate.  Pulmonary:     Effort: Pulmonary effort is normal.  Musculoskeletal:     Cervical back: Neck supple.  Skin:    General: Skin is warm.  Neurological:     Mental Status: He is alert and oriented to person, place, and time.     ED Results / Procedures / Treatments   Labs (all labs ordered are listed, but only abnormal results are displayed) Labs Reviewed  COMPREHENSIVE METABOLIC  PANEL - Abnormal; Notable for the following components:      Result Value   Sodium 134 (*)    CO2 19 (*)    Glucose, Bld 122 (*)    BUN 25 (*)    Total Protein 8.3 (*)    AST 53 (*)    ALT 116 (*)    All other components within normal limits  CBC - Abnormal; Notable for the following components:   RBC 3.74 (*)    Hemoglobin 10.8 (*)    HCT 34.1 (*)    Platelets 559 (*)    All other components within normal limits  POC OCCULT BLOOD, ED - Abnormal; Notable for the following components:   Fecal Occult Bld POSITIVE (*)    All other components within normal limits  PROTIME-INR  BASIC METABOLIC PANEL  CBC  HEMOGLOBIN AND HEMATOCRIT, BLOOD  HEMOGLOBIN AND HEMATOCRIT, BLOOD  TYPE AND SCREEN    EKG None  Radiology No results found.  Procedures Procedures     Medications Ordered in ED Medications  pantoprazole (PROTONIX) 80 mg /NS 100 mL IVPB (has no administration in time range)  acetaminophen (TYLENOL) tablet 650 mg (has no administration in time range)    Or  acetaminophen (TYLENOL) suppository 650 mg (has no administration in time range)  traZODone (DESYREL) tablet 25 mg (has no administration in time range)  ondansetron (ZOFRAN) tablet 4 mg (has no administration in time range)    Or  ondansetron (ZOFRAN) injection 4 mg (has no administration in time range)  albuterol (PROVENTIL) (2.5 MG/3ML) 0.083% nebulizer solution 2.5 mg (has no administration in time range)  metoprolol tartrate (LOPRESSOR) injection 5 mg (has no administration in time range)    ED Course/ Medical Decision Making/ A&P                             Medical Decision Making Amount and/or Complexity of Data Reviewed Labs: ordered.  Risk Decision regarding hospitalization.   This patient presents to the ED with chief complaint(s) of melena, drop in hemoglobin with pertinent past medical history of hypertension.  Patient has never had colonoscopy.The complaint involves an extensive differential diagnosis and also carries with it a high risk of complications and morbidity.    The differential diagnosis includes  Esophagitis PUD/Gastritis/ulcers Gastric tumor Diverticular bleed Colon cancer Rectal bleed Internal hemorrhoids External hemorrhoids  Additional history obtained: Additional history obtained from family Records reviewed previous admission documents and Primary Care Documents.  Reviewed the CT scan from last admission.  I reviewed patient's hemoglobin trends, it has dropped from 14-8.7 in the last 3 months.  Additionally patient's hemoglobin was also low during last admission.  It appears however that he had renal failure, and the main focus was on it.  Patient states that he never mentioned melena to anyone at that time.  Independent labs  interpretation:  The following labs were independently interpreted: Hemoglobin is 10.  Patient's BUN and creatinine are both reassuring.  Given that patient is not actively bleeding, CT scan of the abdomen pelvis is not indicated. Also, patient does not need emergent transfusion.  Consultation: - Consulted or discussed management/test interpretation with external professional: Dr. Levora Angel, GI, they will see the patient.   Final Clinical Impression(s) / ED Diagnoses Final diagnoses:  Melena    Rx / DC Orders ED Discharge Orders     None         Derwood Kaplan, MD 11/12/22  2019  

## 2022-11-12 NOTE — ED Triage Notes (Signed)
Patient is here for evaluation of GI bleed. Reports dark stool since May. Reports that he was sent here for an colonoscopy or endoscopy by his PCP.

## 2022-11-12 NOTE — ED Notes (Signed)
ED TO INPATIENT HANDOFF REPORT  ED Nurse Name and Phone #: Linus Orn Name/Age/Gender Brandon [name] 72 y.o. male Room/Bed: WA25/WA25  Code Status   Code Status: Full Code  Home/SNF/Other Home Patient oriented to: self, place, time, and situation Is this baseline? Yes   Triage Complete: Triage complete  Chief Complaint Upper GI bleed [K92.2]  Triage Note Patient is here for evaluation of GI bleed. Reports dark stool since May. Reports that he was sent here for an colonoscopy or endoscopy by his PCP.   Allergies No Known Allergies  Level of Care/Admitting Diagnosis ED Disposition     ED Disposition  Admit   Condition  --   Comment  Hospital Area: [facility] [[id number]]  Level of Care: Progressive [102]  Admit to Progressive based on following criteria: GI, ENDOCRINE disease patients with GI bleeding, acute liver failure or pancreatitis, stable with diabetic ketoacidosis or thyrotoxicosis (hypothyroid) state.  May place patient in observation at [facility] or [facility] if equivalent level of care is available:: Yes  Covid Evaluation: Asymptomatic - no recent exposure (last 10 days) testing not required  Diagnosis: Upper GI bleed [267195]  Admitting Physician: Maryln Gottron [[id number]]  Attending Physician: Maryln Gottron [[id number]]          B Medical/Surgery History Past Medical History:  Diagnosis Date   Hypertension    Syphilis    Past Surgical History:  Procedure Laterality Date   BACK SURGERY     4 yrs ago   COLONOSCOPY  01/22/2012   Procedure: COLONOSCOPY;  Surgeon: Malissa Hippo, MD;  Location: [facility];  Service: Endoscopy;  Laterality: N/A;  200     A IV Location/Drains/Wounds Patient Lines/Drains/Airways Status     Active Line/Drains/Airways     Name Placement date Placement time Site Days   Peripheral IV 11/12/22 20 G Right Antecubital 11/12/22  2009  Antecubital  less than 1             Intake/Output Last 24 hours No intake or output data in the 24 hours ending 11/12/22 2024  Labs/Imaging Results for orders placed or performed during the hospital encounter of 11/12/22 (from the past 48 hour(s))  Comprehensive metabolic panel     Status: Abnormal   Collection Time: 11/12/22  4:27 PM  Result Value Ref Range   Sodium 134 (L) 135 - 145 mmol/L   Potassium 3.7 3.5 - 5.1 mmol/L   Chloride 104 98 - 111 mmol/L   CO2 19 (L) 22 - 32 mmol/L   Glucose, Bld 122 (H) 70 - 99 mg/dL    Comment: Glucose reference range applies only to samples taken after fasting for at least 8 hours.   BUN 25 (H) 8 - 23 mg/dL   Creatinine, Ser 1.91 0.61 - 1.24 mg/dL   Calcium 47.8 8.9 - 29.5 mg/dL   Total Protein 8.3 (H) 6.5 - 8.1 g/dL   Albumin 3.8 3.5 - 5.0 g/dL   AST 53 (H) 15 - 41 U/L   ALT 116 (H) 0 - 44 U/L   Alkaline Phosphatase 97 38 - 126 U/L   Total Bilirubin 0.5 0.3 - 1.2 mg/dL   GFR, Estimated >62 >13 mL/min    Comment: (NOTE) Calculated using the CKD-EPI Creatinine Equation (2021)    Anion gap 11 5 - 15    Comment: Performed at [facility], [address] [place], [place] [address]  CBC     Status:  Abnormal   Collection Time: 11/12/22  4:27 PM  Result Value Ref Range   WBC 7.2 4.0 - 10.5 K/uL   RBC 3.74 (L) 4.22 - 5.81 MIL/uL   Hemoglobin 10.8 (L) 13.0 - 17.0 g/dL   HCT 16.1 (L) 09.6 - 04.5 %   MCV 91.2 80.0 - 100.0 fL   MCH 28.9 26.0 - 34.0 pg   MCHC 31.7 30.0 - 36.0 g/dL   RDW 40.9 81.1 - 91.4 %   Platelets 559 (H) 150 - 400 K/uL   nRBC 0.0 0.0 - 0.2 %    Comment: Performed at [facility], [address] [place], [place] [address]  Type and screen [facility]     Status: None   Collection Time: 11/12/22  4:27 PM  Result Value Ref Range   ABO/RH(D) O POS    Antibody Screen NEG    Sample Expiration      11/15/2022,2359 Performed at [facility], [address] [place], [place]  [address]   POC occult blood, ED     Status: Abnormal   Collection Time: 11/12/22  7:15 PM  Result Value Ref Range   Fecal Occult Bld POSITIVE (A) NEGATIVE   No results found.  Pending Labs Unresulted Labs (From admission, onward)     Start     Ordered   11/13/22 0500  Basic metabolic panel  Tomorrow morning,   R        11/12/22 1952   11/13/22 0500  CBC  Tomorrow morning,   R        11/12/22 1952   11/12/22 1959  Hemoglobin and hematocrit, blood  Now then every 8 hours,   R (with TIMED occurrences)      11/12/22 1959   11/12/22 1932  Protime-INR  Once,   STAT        11/12/22 1931            Vitals/Pain Today's Vitals   11/12/22 1556 11/12/22 1800 11/12/22 1830 11/12/22 1940  BP:  127/88 127/83   Pulse:  74 75   Resp:   (!) 22   Temp:    98 F (36.7 C)  TempSrc:      SpO2:  100% 100%   Weight: 79.8 kg     Height: 6\' 4"  (1.93 m)     PainSc: 6        Isolation Precautions No active isolations  Medications Medications  pantoprazole (PROTONIX) 80 mg /NS 100 mL IVPB (has no administration in time range)  acetaminophen (TYLENOL) tablet 650 mg (has no administration in time range)    Or  acetaminophen (TYLENOL) suppository 650 mg (has no administration in time range)  traZODone (DESYREL) tablet 25 mg (has no administration in time range)  ondansetron (ZOFRAN) tablet 4 mg (has no administration in time range)    Or  ondansetron (ZOFRAN) injection 4 mg (has no administration in time range)  albuterol (PROVENTIL) (2.5 MG/3ML) 0.083% nebulizer solution 2.5 mg (has no administration in time range)  metoprolol tartrate (LOPRESSOR) injection 5 mg (has no administration in time range)    Mobility walks     Focused Assessments GI   R Recommendations: See Admitting Provider Note  Report given to:   Additional Notes: AAOx4, ambulates, son at bedside

## 2022-11-13 ENCOUNTER — Encounter (HOSPITAL_COMMUNITY): Payer: Self-pay | Admitting: Internal Medicine

## 2022-11-13 ENCOUNTER — Observation Stay (HOSPITAL_COMMUNITY): Payer: Medicare HMO | Admitting: Anesthesiology

## 2022-11-13 ENCOUNTER — Encounter (HOSPITAL_COMMUNITY)
Admission: EM | Disposition: A | Discharge status: Home Health | Payer: Self-pay | Source: Home / Self Care | Attending: Internal Medicine

## 2022-11-13 ENCOUNTER — Observation Stay (HOSPITAL_BASED_OUTPATIENT_CLINIC_OR_DEPARTMENT_OTHER): Payer: Medicare HMO | Admitting: Anesthesiology

## 2022-11-13 DIAGNOSIS — K922 Gastrointestinal hemorrhage, unspecified: Secondary | ICD-10-CM | POA: Diagnosis not present

## 2022-11-13 DIAGNOSIS — K2970 Gastritis, unspecified, without bleeding: Secondary | ICD-10-CM | POA: Diagnosis not present

## 2022-11-13 DIAGNOSIS — K269 Duodenal ulcer, unspecified as acute or chronic, without hemorrhage or perforation: Secondary | ICD-10-CM

## 2022-11-13 DIAGNOSIS — D62 Acute posthemorrhagic anemia: Secondary | ICD-10-CM

## 2022-11-13 DIAGNOSIS — K3189 Other diseases of stomach and duodenum: Secondary | ICD-10-CM | POA: Diagnosis not present

## 2022-11-13 DIAGNOSIS — F17200 Nicotine dependence, unspecified, uncomplicated: Secondary | ICD-10-CM | POA: Diagnosis not present

## 2022-11-13 DIAGNOSIS — K921 Melena: Secondary | ICD-10-CM | POA: Diagnosis not present

## 2022-11-13 DIAGNOSIS — I10 Essential (primary) hypertension: Secondary | ICD-10-CM

## 2022-11-13 DIAGNOSIS — F17210 Nicotine dependence, cigarettes, uncomplicated: Secondary | ICD-10-CM

## 2022-11-13 DIAGNOSIS — R531 Weakness: Secondary | ICD-10-CM | POA: Diagnosis not present

## 2022-11-13 HISTORY — PX: BIOPSY: SHX5522

## 2022-11-13 HISTORY — PX: ESOPHAGOGASTRODUODENOSCOPY (EGD) WITH PROPOFOL: SHX5813

## 2022-11-13 LAB — BASIC METABOLIC PANEL
Anion gap: 6 (ref 5–15)
BUN: 24 mg/dL — ABNORMAL HIGH (ref 8–23)
CO2: 25 mmol/L (ref 22–32)
Calcium: 9.5 mg/dL (ref 8.9–10.3)
Chloride: 105 mmol/L (ref 98–111)
Creatinine, Ser: 0.98 mg/dL (ref 0.61–1.24)
GFR, Estimated: >60 mL/min (ref >60)
Glucose, Bld: 89 mg/dL (ref 70–99)
Potassium: 4.1 mmol/L (ref 3.5–5.1)
Sodium: 136 mmol/L (ref 135–145)

## 2022-11-13 LAB — CBC
HCT: 29.2 % — ABNORMAL LOW (ref 39.0–52.0)
Hemoglobin: 9.2 g/dL — ABNORMAL LOW (ref 13.0–17.0)
MCH: 29 pg (ref 26.0–34.0)
MCHC: 31.5 g/dL (ref 30.0–36.0)
MCV: 92.1 fL (ref 80.0–100.0)
Platelets: 483 10*3/uL — ABNORMAL HIGH (ref 150–400)
RBC: 3.17 MIL/uL — ABNORMAL LOW (ref 4.22–5.81)
RDW: 14 % (ref 11.5–15.5)
WBC: 7.4 10*3/uL (ref 4.0–10.5)
nRBC: 0 % (ref 0.0–0.2)

## 2022-11-13 LAB — HEMOGLOBIN AND HEMATOCRIT, BLOOD
HCT: 32.7 % — ABNORMAL LOW (ref 39.0–52.0)
HCT: 32.2 % — ABNORMAL LOW (ref 39.0–52.0)
Hemoglobin: 10.4 g/dL — ABNORMAL LOW (ref 13.0–17.0)
Hemoglobin: 10.1 g/dL — ABNORMAL LOW (ref 13.0–17.0)

## 2022-11-13 LAB — FERRITIN: Ferritin: 357 ng/mL — ABNORMAL HIGH (ref 24–336)

## 2022-11-13 LAB — IRON AND TIBC
Iron: 55 ug/dL (ref 45–182)
Saturation Ratios: 28 % (ref 17.9–39.5)
TIBC: 200 ug/dL — ABNORMAL LOW (ref 250–450)
UIBC: 145 ug/dL

## 2022-11-13 LAB — RETICULOCYTES
Immature Retic Fract: 23.6 % — ABNORMAL HIGH (ref 2.3–15.9)
RBC.: 3.2 MIL/uL — ABNORMAL LOW (ref 4.22–5.81)
Retic Count, Absolute: 63.7 10*3/uL (ref 19.0–186.0)
Retic Ct Pct: 2 % (ref 0.4–3.1)

## 2022-11-13 LAB — VITAMIN B12: Vitamin B-12: 431 pg/mL (ref 180–914)

## 2022-11-13 LAB — FOLATE: Folate: 11.5 ng/mL (ref >5.9)

## 2022-11-13 SURGERY — ESOPHAGOGASTRODUODENOSCOPY (EGD) WITH PROPOFOL
Anesthesia: General | Laterality: Left

## 2022-11-13 MED ORDER — PANTOPRAZOLE SODIUM 40 MG PO TBEC
40.0000 mg | DELAYED_RELEASE_TABLET | Freq: Two times a day (BID) | ORAL | Status: DC
  Administered 2022-11-13 – 2022-11-16 (×6): 40 mg via ORAL
  Filled 2022-11-13 (×7): qty 1

## 2022-11-13 MED ORDER — PROPOFOL 500 MG/50ML IV EMUL
INTRAVENOUS | Status: DC | PRN
  Administered 2022-11-13: 125 ug/kg/min via INTRAVENOUS

## 2022-11-13 MED ORDER — LIDOCAINE 2% (20 MG/ML) 5 ML SYRINGE
INTRAMUSCULAR | Status: DC | PRN
  Administered 2022-11-13: 100 mg via INTRAVENOUS

## 2022-11-13 MED ORDER — ONDANSETRON HCL 4 MG/2ML IJ SOLN
INTRAMUSCULAR | Status: DC | PRN
  Administered 2022-11-13: 4 mg via INTRAVENOUS

## 2022-11-13 MED ORDER — LACTATED RINGERS IV SOLN: INTRAVENOUS | Status: DC

## 2022-11-13 MED ORDER — PROPOFOL 500 MG/50ML IV EMUL
INTRAVENOUS | Status: AC
  Filled 2022-11-13: qty 50

## 2022-11-13 MED ORDER — FENTANYL CITRATE (PF) 100 MCG/2ML IJ SOLN
INTRAMUSCULAR | Status: AC
  Filled 2022-11-13: qty 2

## 2022-11-13 MED ORDER — PANTOPRAZOLE SODIUM 40 MG IV SOLR: 40.0000 mg | Freq: Two times a day (BID) | INTRAVENOUS | Status: DC

## 2022-11-13 MED ORDER — PROPOFOL 10 MG/ML IV BOLUS
INTRAVENOUS | Status: DC | PRN
  Administered 2022-11-13: 100 mg via INTRAVENOUS

## 2022-11-13 MED ORDER — PANTOPRAZOLE INFUSION (NEW) - SIMPLE MED
8.0000 mg/h | INTRAVENOUS | Status: DC
  Administered 2022-11-13: 8 mg/h via INTRAVENOUS
  Filled 2022-11-13 (×2): qty 100
  Filled 2022-11-13: qty 80

## 2022-11-13 MED ORDER — SUCCINYLCHOLINE CHLORIDE 200 MG/10ML IV SOSY
PREFILLED_SYRINGE | INTRAVENOUS | Status: DC | PRN
  Administered 2022-11-13: 100 mg via INTRAVENOUS

## 2022-11-13 SURGICAL SUPPLY — 15 items

## 2022-11-13 NOTE — Op Note (Signed)
[facility] Patient Name: Brandon [name] Procedure Date: 11/13/2022 MRN: [id number] Attending MD: Willis Modena , MD, [id number] Date of Birth: 06-Apr-1951 CSN: [id number] Age: 72 Admit Type: Inpatient Procedure:                Upper GI endoscopy Indications:              Acute post hemorrhagic anemia, Melena Providers:                Willis Modena, MD, Fransisca Connors, Beryle Beams, Technician, Leroy Libman, CRNA Referring MD:             [facility]. Medicines:                General Anesthesia Complications:            No immediate complications. Estimated Blood Loss:     Estimated blood loss: none. Procedure:                Pre-Anesthesia Assessment:                           - Prior to the procedure, a History and Physical                            was performed, and patient medications and                            allergies were reviewed. The patient's tolerance of                            previous anesthesia was also reviewed. The risks                            and benefits of the procedure and the sedation                            options and risks were discussed with the patient.                            All questions were answered, and informed consent                            was obtained. Prior Anticoagulants: The patient has                            taken no anticoagulant or antiplatelet agents. ASA                            Grade Assessment: II - A patient with mild systemic                            disease. After reviewing the risks and benefits,  the patient was deemed in satisfactory condition to                            undergo the procedure.                           After obtaining informed consent, the endoscope was                            passed under direct vision. Throughout the                            procedure, the patient's blood pressure, pulse, and                             oxygen saturations were monitored continuously. The                            [id number]-[id number] ([id number] Olympus endoscope was introduced                            through the mouth, and advanced to the second part                            of duodenum. The upper GI endoscopy was                            accomplished without difficulty. The patient                            tolerated the procedure well. Scope In: Scope Out: Findings:      The examined esophagus was normal.      Patchy mild inflammation was found in the entire examined stomach.       Biopsies were taken with a cold forceps for histology.      The exam of the stomach was otherwise normal.      Two non-bleeding cratered duodenal ulcers with no stigmata of bleeding       were found in the duodenal bulb. The largest lesion was 8 mm in largest       dimension.      Localized moderate mucosal changes characterized by congestion were       found in the duodenal bulb.      The exam of the duodenum was otherwise normal.      No old or fresh blood was seen to the extent of our examination. Impression:               - Normal esophagus.                           - Gastritis. Biopsied.                           - Non-bleeding duodenal ulcers with no stigmata of  bleeding. Likely NSAID-mediated.                           - Mucosal changes in the duodenum. Moderate Sedation:      None Recommendation:           - Return patient to hospital ward for ongoing care.                           - Full liquid diet today.                           - Continue present medications, including PPI.                            Endoscopic appearance of ulcers with low risk for                            rebleeding.                           - Await pathology results, assessing for H. pylori.                           - [facility] will follow. Possibly home tomorrow. Procedure Code(s):        ---  Professional ---                           [phone], Esophagogastroduodenoscopy, flexible,                            transoral; with biopsy, single or multiple Diagnosis Code(s):        --- Professional ---                           K29.70, Gastritis, unspecified, without bleeding                           K26.9, Duodenal ulcer, unspecified as acute or                            chronic, without hemorrhage or perforation                           K31.89, Other diseases of stomach and duodenum                           D62, Acute posthemorrhagic anemia                           K92.1, Melena (includes Hematochezia) CPT copyright 2022 [employer] Association. All rights reserved. The codes documented in this report are preliminary and upon coder review may  be revised to meet current compliance requirements. Willis Modena, MD 11/13/2022 2:42:52 PM This report has been signed electronically. Number of Addenda: 0

## 2022-11-13 NOTE — Transfer of Care (Signed)
Immediate Anesthesia Transfer of Care Note  Patient: Brandon [name]  Procedure(s) Performed: ESOPHAGOGASTRODUODENOSCOPY (EGD) WITH PROPOFOL (Left) BIOPSY  Patient Location: Endoscopy Unit  Anesthesia Type:General  Level of Consciousness: awake and alert   Airway & Oxygen Therapy: Patient Spontanous Breathing and Patient connected to face mask oxygen  Post-op Assessment: Report given to RN and Post -op Vital signs reviewed and stable  Post vital signs: Reviewed and stable  Last Vitals:  Vitals Value Taken Time  BP    Temp    Pulse    Resp    SpO2      Last Pain:  Vitals:   11/13/22 1310  TempSrc: Temporal  PainSc: 0-No pain         Complications: No notable events documented.

## 2022-11-13 NOTE — Anesthesia Procedure Notes (Signed)
Procedure Name: Intubation Date/Time: 11/13/2022 2:02 PM  Performed by: Florene Route, CRNAPre-anesthesia Checklist: Patient identified, Emergency Drugs available, Suction available and Patient being monitored Patient Re-evaluated:Patient Re-evaluated prior to induction Oxygen Delivery Method: Circle system utilized Preoxygenation: Pre-oxygenation with 100% oxygen Induction Type: IV induction, Cricoid Pressure applied and Rapid sequence Laryngoscope Size: Miller and 3 Grade View: Grade I Tube type: Oral Number of attempts: 1 Airway Equipment and Method: Stylet and Oral airway Placement Confirmation: ETT inserted through vocal cords under direct vision, positive ETCO2 and breath sounds checked- equal and bilateral Secured at: 22 cm Tube secured with: Tape Dental Injury: Teeth and Oropharynx as per pre-operative assessment

## 2022-11-13 NOTE — Consult Note (Signed)
[facility] Consultation Note  Referring Provider: [facility] Primary Care Physician:  Pickard, Warren T, MD Primary Gastroenterologist:  Dr. Rehman  Reason for Consultation:  melena  HPI: Brandon [name] is a 71 y.o. male admitted progressive melena, weakness, drop Hgb ~ 14 to ~ 9 over past few weeks.  No abdominal pain.  Having some constipation.  Using NSAIDs.  No prior endoscopy.  Colonoscopy last 2013, unknown results.   Past Medical History:  Diagnosis Date   Hypertension    Syphilis     Past Surgical History:  Procedure Laterality Date   BACK SURGERY     4 yrs ago   COLONOSCOPY  01/22/2012   Procedure: COLONOSCOPY;  Surgeon: Najeeb U Rehman, MD;  Location: [facility];  Service: Endoscopy;  Laterality: N/A;  200    Prior to Admission medications   Medication Sig Start Date End Date Taking? Authorizing Provider  feeding supplement (ENSURE ENLIVE / ENSURE PLUS) LIQD Take 237 mLs by mouth 2 (two) times daily between meals. 11/04/22  Yes Thompson, Daniel V, MD  losartan (COZAAR) 50 MG tablet Take 1 tablet (50 mg total) by mouth daily. Patient not taking: Reported on 11/12/2022 11/08/22   Thompson, Daniel V, MD  megestrol (MEGACE) 400 MG/10ML suspension Take 10 mLs (400 mg total) by mouth daily. Patient not taking: Reported on 11/12/2022 11/04/22   Thompson, Daniel V, MD  multivitamin (RENA-VIT) TABS tablet Take 1 tablet by mouth at bedtime. Patient not taking: Reported on 11/12/2022 11/03/22   Thompson, Daniel V, MD  ondansetron (ZOFRAN-ODT) 8 MG disintegrating tablet Take 1 tablet (8 mg total) by mouth every 8 (eight) hours as needed for nausea or vomiting. Patient not taking: Reported on 11/12/2022 10/20/22   Glick, David, MD  polyethylene glycol (MIRALAX / GLYCOLAX) 17 g packet Take 17 g by mouth daily as needed for mild constipation. Patient not taking: Reported on 11/12/2022 11/03/22   Thompson, Daniel V, MD    Current Facility-Administered Medications   Medication Dose Route Frequency Provider Last Rate Last Admin   acetaminophen (TYLENOL) tablet 650 mg  650 mg Oral Q6H PRN Ikramullah, Mir M, MD       Or   acetaminophen (TYLENOL) suppository 650 mg  650 mg Rectal Q6H PRN Ikramullah, Mir M, MD       albuterol (PROVENTIL) (2.5 MG/3ML) 0.083% nebulizer solution 2.5 mg  2.5 mg Nebulization Q2H PRN Ikramullah, Mir M, MD       metoprolol tartrate (LOPRESSOR) injection 5 mg  5 mg Intravenous Q6H PRN Ikramullah, Mir M, MD       ondansetron (ZOFRAN) tablet 4 mg  4 mg Oral Q6H PRN Ikramullah, Mir M, MD       Or   ondansetron (ZOFRAN) injection 4 mg  4 mg Intravenous Q6H PRN Ikramullah, Mir M, MD       [START ON 11/16/2022] pantoprazole (PROTONIX) injection 40 mg  40 mg Intravenous Q12H Chavez, Abigail, NP       pantoprozole (PROTONIX) 80 mg /NS 100 mL infusion  8 mg/hr Intravenous Continuous Chavez, Abigail, NP 10 mL/hr at 11/13/22 0419 8 mg/hr at 11/13/22 0419   traZODone (DESYREL) tablet 25 mg  25 mg Oral QHS PRN Ikramullah, Mir M, MD        Allergies as of 11/12/2022   (No Known Allergies)    Family History  Problem Relation Age of Onset   Diabetes Mother    Hypertension Mother    Breast cancer Mother      Alcohol abuse Father    Aneurysm Father    Breast cancer Daughter    Colon cancer Neg Hx     Social History   Socioeconomic History   Marital status: Legally Separated    Spouse name: Not on file   Number of children: 3   Years of education: 12   Highest education level: High school graduate  Occupational History   Occupation: Retired- Trucker  Tobacco Use   Smoking status: Every Day    Packs/day: 0.50    Years: 45.00    Additional pack years: 0.00    Total pack years: 22.50    Types: Cigarettes   Smokeless tobacco: Never  Vaping Use   Vaping Use: Never used  Substance and Sexual Activity   Alcohol use: No   Drug use: No   Sexual activity: Yes    Birth control/protection: None  Other Topics Concern   Not on file   Social History Narrative   Grew up in [place], [place]. 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    Married for 17 years. Has 3 children, 11 grandchildren, 4 great grandchildren.   Wife had breast cancer and she died eight years ago. Remarried, wife is younger.    Wears seatbelt.   Eats all food groups.    Drinks 3-4 sodas per day.   Right-handed.   Social Determinants of Health   Financial Resource Strain: Low Risk  (04/08/2018)   Overall Financial Resource Strain (CARDIA)    Difficulty of Paying Living Expenses: Not hard at all  Food Insecurity: No Food Insecurity (10/28/2022)   Hunger Vital Sign    Worried About Running Out of Food in the Last Year: Never true    Ran Out of Food in the Last Year: Never true  Transportation Needs: No Transportation Needs (10/28/2022)   PRAPARE - Transportation    Lack of Transportation (Medical): No    Lack of Transportation (Non-Medical): No  Physical Activity: Insufficiently Active (11/23/2021)   Exercise Vital Sign    Days of Exercise per Week: 2 days    Minutes of Exercise per Session: 60 min  Stress: No Stress Concern Present (11/23/2021)   [employer] - Occupational Stress Questionnaire    Feeling of Stress : Not at all  Social Connections: Socially Integrated (11/23/2021)   Social Connection and Isolation Panel [NHANES]    Frequency of Communication with Friends and Family: More than three times a week    Frequency of Social Gatherings with Friends and Family: More than three times a week    Attends Religious Services: More than 4 times per year    Active Member of Clubs or Organizations: Yes    Attends Club or Organization Meetings: More than 4 times per year    Marital Status: Married  Intimate Partner Violence: Not At Risk (10/28/2022)   Humiliation, Afraid, Rape, and Kick questionnaire    Fear of Current or Ex-Partner: No    Emotionally Abused: No    Physically Abused: No    Sexually Abused: No    Review  of Systems: As per HPI, all others negative  Physical Exam: Vital signs in last 24 hours: Temp:  [98 F (36.7 C)-99.1 F (37.3 C)] 98.9 F (37.2 C) (06/19 0902) Pulse Rate:  [64-89] 69 (06/19 0902) Resp:  [17-22] 18 (06/19 0902) BP: (127-140)/(80-88) 140/84 (06/19 0902) SpO2:  [100 %] 100 % (06/19 0902) Weight:  [79.8 kg] 79.8 kg (06/18 1556)     General:   Alert,  thin but not cachectic, pleasant and cooperative in NAD Head:  Normocephalic and atraumatic. Eyes:  Sclera clear, no icterus.   Conjunctiva pale Ears:  Normal auditory acuity. Nose:  No deformity, discharge,  or lesions. Mouth:  No deformity or lesions.  Oropharynx pale and dry Neck:  Supple; no masses or thyromegaly. Lungs:  No respiratory distress Abdomen:  Soft, nontender and nondistended. No masses, hepatosplenomegaly or hernias noted. Without guarding, and without rebound.     Msk:  Symmetrical without gross deformities. Normal posture. Pulses:  Normal pulses noted. Extremities:  Without clubbing or edema. Neurologic:  Alert and  oriented x4;  grossly normal neurologically. Skin:  Intact without significant lesions or rashes. Psych:  Alert and cooperative. Normal mood and affect.   Lab Results: Recent Labs    11/11/22 1455 11/12/22 1627 11/12/22 2010 11/13/22 0514  WBC 6.1 7.2  --  7.4  HGB 10.4* 10.8* 10.7* 9.2*  HCT 32.7* 34.1* 34.2* 29.2*  PLT 536* 559*  --  483*   BMET Recent Labs    11/11/22 1455 11/12/22 1627 11/13/22 0514  NA 138 134* 136  K 4.5 3.7 4.1  CL 102 104 105  CO2 24 19* 25  GLUCOSE 136* 122* 89  BUN 22 25* 24*  CREATININE 0.91 1.07 0.98  CALCIUM 10.5* 10.1 9.5   LFT Recent Labs    11/12/22 1627  PROT 8.3*  ALBUMIN 3.8  AST 53*  ALT 116*  ALKPHOS 97  BILITOT 0.5   PT/INR Recent Labs    11/12/22 2010  LABPROT 14.4  INR 1.1    Studies/Results: No results found.  Impression:   Melena. Acute blood loss anemia. Symptomatic anemia. Prior NSAID  use.  Plan:   NPO. PPI. Serial CBCs. Hydration. Endoscopy today. Risks (bleeding, infection, bowel perforation that could require surgery, sedation-related changes in cardiopulmonary systems), benefits (identification and possible treatment of source of symptoms, exclusion of certain causes of symptoms), and alternatives (watchful waiting, radiographic imaging studies, empiric medical treatment) of upper endoscopy (EGD) were explained to patient/family in detail and patient wishes to proceed.  If endoscopy unrevealing, would need colonoscopy this admission. [facility] will follow.   LOS: 0 days   Jessicca Stitzer M  11/13/2022, 10:23 AM  Cell [phone] If no answer or after 5 PM call [phone]  

## 2022-11-13 NOTE — Progress Notes (Signed)
PROGRESS NOTE    Brandon [name]  WUJ:[id number] DOB: 12-31-1950 DOA: 11/12/2022 PCP: Donita Brooks, MD    Brief Narrative:   Brandon [name] is a 72 y.o. male with past medical history significant for essential hypertension who presented to [facility] ED on 11/12/2022 with persistent dark tarry stools.  Recently hospitalized for syncope, acute renal failure.  Had reported colonoscopy in 2013; but no report available for review.  Denies any antiplatelet/anticoagulant use.  Patient has been reported with fatigue, low blood pressures.  In follow-up, patient was seen by PCP with fingerstick hemoglobin noted to be 9.9.  Patient's home losartan was discontinued and was started on ferrous sulfate twice daily.  The morning thereafter, patient had a large dark BM and was extremely weak and family brought him to the ED for further evaluation management.  Also reports that he had been lying on the bathroom floor for "2 days" and was found by his sibling.  In the ED, temperature 98.0 F, HR 89, RR 18, BP 06/23/1983, SpO2 100% on room air.  WBC 7.2, hemoglobin 10.8, platelets 559.  Sodium 134, potassium 3.7, chloride 104, CO2 19, glucose 122, BUN 25, creatinine 1.07.  AST 53, ALT 116, total bilirubin 0.5.  FOBT positive.  Patient was started on Protonix drip.  EDP discussed with GI, Dr. Levora Angel who will see in consult and requested hospitalist admission.  [facility] consulted for admission for further evaluation management for concern of upper GI bleed.   Assessment & Plan:   Upper GI bleed Patient presenting to ED with dark tarry stools for several weeks, no history of NSAID abuse, no use of antiplatelet/anticoagulants.  Hemoglobin 10.8 on admission.  FOBT positive.  Anemia panel with iron 55, TIBC low at 200, ferritin 357, folate 11.5, vitamin B12 431. -- GI following, appreciate assistance -- Hgb 10.8>10.7>9.2>10.4 -- Continue Protonix drip -- Hemoglobin every 6 hours -- Transfuse for  hemoglobin less than 7.0 or active bleeding  Essential hypertension Home losartan recently discontinued by PCP.  Has had recent orthostatic hypotension leading to likely syncopal event from recent hospitalization. -- Metoprolol tartrate 5 mg IV every 6 hours as needed elevated BP -- Continue monitor BP closely  Weakness/debility/deconditioning: -- PT/OT evaluation tomorrow   DVT prophylaxis: SCDs Start: 11/12/22 1952    Code Status: Full Code Family Communication: No family present at bedside this morning  Disposition Plan:  Level of care: Progressive Status is: Observation The patient remains OBS appropriate and will d/c before 2 midnights.    Consultants:  [facility] gastroenterology, Dr. Dulce Sellar  Procedures:  EGD pending for today  Antimicrobials:  None   Subjective: Patient seen examined bedside, resting comfortably.  Lying in bed.  Continues to complain of generalized fatigue/weakness.  Seen by GI this morning, plan EGD this afternoon.  Currently hemoglobin appears to be stable.  No further bowel movements since admission.  No other specific complaints or concerns at this time.  Denies headache, no dizziness, no chest pain, no palpitations, no shortness of breath, no fever/chills/night sweats, no nausea/vomiting/diarrhea, no focal weakness, no fatigue, no cough/congestion, no paresthesias.  No acute events overnight per nursing staff.  Objective: Vitals:   11/13/22 0517 11/13/22 0902 11/13/22 1245 11/13/22 1310  BP: 134/80 (!) 140/84 133/77 (!) 147/77  Pulse: 67 69 (!) 59 71  Resp: 17 18 16 20   Temp: 99.1 F (37.3 C) 98.9 F (37.2 C) 97.8 F (36.6 C) (!) 97.2 F (36.2 C)  TempSrc: Oral Oral Oral Temporal  SpO2: 100% 100% 100% 99%  Weight:    79.8 kg  Height:    6\' 4"  (1.93 m)    Intake/Output Summary (Last 24 hours) at 11/13/2022 1401 Last data filed at 11/13/2022 0900 Gross per 24 hour  Intake 16.78 ml  Output 200 ml  Net -183.22 ml   Filed Weights   11/12/22  1511 11/12/22 1556 11/13/22 1310  Weight: 79.8 kg 79.8 kg 79.8 kg    Examination:  Physical Exam: GEN: NAD, alert and oriented x 3, wd/wn HEENT: NCAT, PERRL, EOMI, sclera clear, MMM PULM: CTAB w/o wheezes/crackles, normal respiratory effort CV: RRR w/o M/G/R GI: abd soft, NTND, NABS, no R/G/M MSK: no peripheral edema, muscle strength globally intact 5/5 bilateral upper/lower extremities NEURO: CN II-XII intact, no focal deficits, sensation to light touch intact PSYCH: normal mood/affect Integumentary: dry/intact, no rashes or wounds    Data Reviewed: I have personally reviewed following labs and imaging studies  CBC: Recent Labs  Lab 11/11/22 1455 11/12/22 1627 11/12/22 2010 11/13/22 0514 11/13/22 1241  WBC 6.1 7.2  --  7.4  --   NEUTROABS 4,227  --   --   --   --   HGB 10.4* 10.8* 10.7* 9.2* 10.4*  HCT 32.7* 34.1* 34.2* 29.2* 32.7*  MCV 90.8 91.2  --  92.1  --   PLT 536* 559*  --  483*  --    Basic Metabolic Panel: Recent Labs  Lab 11/11/22 1455 11/12/22 1627 11/13/22 0514  NA 138 134* 136  K 4.5 3.7 4.1  CL 102 104 105  CO2 24 19* 25  GLUCOSE 136* 122* 89  BUN 22 25* 24*  CREATININE 0.91 1.07 0.98  CALCIUM 10.5* 10.1 9.5   GFR: Estimated Creatinine Clearance: 80.3 mL/min (by C-G formula based on SCr of 0.98 mg/dL). Liver Function Tests: Recent Labs  Lab 11/11/22 1455 11/12/22 1627  AST 43* 53*  ALT 112* 116*  ALKPHOS  --  97  BILITOT 0.4 0.5  PROT 7.4 8.3*  ALBUMIN  --  3.8   No results for input(s): "LIPASE", "AMYLASE" in the last 168 hours. No results for input(s): "AMMONIA" in the last 168 hours. Coagulation Profile: Recent Labs  Lab 11/12/22 2010  INR 1.1   Cardiac Enzymes: No results for input(s): "CKTOTAL", "CKMB", "CKMBINDEX", "TROPONINI" in the last 168 hours. BNP (last 3 results) No results for input(s): "PROBNP" in the last 8760 hours. HbA1C: No results for input(s): "HGBA1C" in the last 72 hours. CBG: No results for  input(s): "GLUCAP" in the last 168 hours. Lipid Profile: No results for input(s): "CHOL", "HDL", "LDLCALC", "TRIG", "CHOLHDL", "LDLDIRECT" in the last 72 hours. Thyroid Function Tests: No results for input(s): "TSH", "T4TOTAL", "FREET4", "T3FREE", "THYROIDAB" in the last 72 hours. Anemia Panel: Recent Labs    11/13/22 0511  VITAMINB12 431  FOLATE 11.5  FERRITIN 357*  TIBC 200*  IRON 55  RETICCTPCT 2.0   Sepsis Labs: No results for input(s): "PROCALCITON", "LATICACIDVEN" in the last 168 hours.  No results found for this or any previous visit (from the past 240 hour(s)).       Radiology Studies: No results found.      Scheduled Meds:  [MAR Hold] pantoprazole  40 mg Intravenous Q12H   Continuous Infusions:  lactated ringers     pantoprazole 8 mg/hr (11/13/22 0419)     LOS: 0 days    Time spent: 52 minutes spent on chart review, discussion with nursing staff, consultants, updating family and interview/physical  exam; more than 50% of that time was spent in counseling and/or coordination of care.    [name] [place], DO [facility] Available via Epic secure chat 7am-7pm After these hours, please refer to coverage provider listed on [email or web address] 11/13/2022, 2:01 PM

## 2022-11-13 NOTE — Anesthesia Preprocedure Evaluation (Addendum)
Anesthesia Evaluation  Patient identified by MRN, date of birth, ID band Patient awake    Reviewed: Allergy & Precautions, H&P , NPO status , Patient's Chart, lab work & pertinent test results  Airway Mallampati: II  TM Distance: >3 FB Neck ROM: Full    Dental no notable dental hx. (+) Edentulous Upper, Dental Advisory Given   Pulmonary Current Smoker   Pulmonary exam normal breath sounds clear to auscultation       Cardiovascular hypertension, Pt. on medications  Rhythm:Regular Rate:Normal     Neuro/Psych negative neurological ROS  negative psych ROS   GI/Hepatic negative GI ROS, Neg liver ROS,,,  Endo/Other  negative endocrine ROS    Renal/GU negative Renal ROS  negative genitourinary   Musculoskeletal   Abdominal   Peds  Hematology  (+) Blood dyscrasia, anemia   Anesthesia Other Findings   Reproductive/Obstetrics negative OB ROS                             Anesthesia Physical Anesthesia Plan  ASA: 2  Anesthesia Plan: MAC   Post-op Pain Management: Minimal or no pain anticipated   Induction: Intravenous  PONV Risk Score and Plan: 0 and Propofol infusion  Airway Management Planned: Natural Airway and Simple Face Mask  Additional Equipment:   Intra-op Plan:   Post-operative Plan:   Informed Consent: I have reviewed the patients History and Physical, chart, labs and discussed the procedure including the risks, benefits and alternatives for the proposed anesthesia with the patient or authorized representative who has indicated his/her understanding and acceptance.     Dental advisory given  Plan Discussed with: CRNA  Anesthesia Plan Comments:        Anesthesia Quick Evaluation

## 2022-11-13 NOTE — Interval H&P Note (Signed)
History and Physical Interval Note:  11/13/2022 1:50 PM  Brandon [name]  has presented today for surgery, with the diagnosis of melena, anemia.  The various methods of treatment have been discussed with the patient and family. After consideration of risks, benefits and other options for treatment, the patient has consented to  Procedure(s): ESOPHAGOGASTRODUODENOSCOPY (EGD) WITH PROPOFOL (Left) as a surgical intervention.  The patient's history has been reviewed, patient examined, no change in status, stable for surgery.  I have reviewed the patient's chart and labs.  Questions were answered to the patient's satisfaction.     Freddy Jaksch

## 2022-11-13 NOTE — Anesthesia Postprocedure Evaluation (Signed)
Anesthesia Post Note  Patient: Brandon [name]  Procedure(s) Performed: ESOPHAGOGASTRODUODENOSCOPY (EGD) WITH PROPOFOL (Left) BIOPSY     Patient location during evaluation: Endoscopy Anesthesia Type: General Level of consciousness: awake and alert Pain management: pain level controlled Vital Signs Assessment: post-procedure vital signs reviewed and stable Respiratory status: spontaneous breathing, nonlabored ventilation and respiratory function stable Cardiovascular status: blood pressure returned to baseline and stable Postop Assessment: no apparent nausea or vomiting Anesthetic complications: no  No notable events documented.  Last Vitals:  Vitals:   11/13/22 1450 11/13/22 2028  BP: 124/81 121/79  Pulse: 61 66  Resp: 15 16  Temp:  36.8 C  SpO2: 98% 100%    Last Pain:  Vitals:   11/13/22 2028  TempSrc: Oral  PainSc:                  Tykisha Areola,W. EDMOND

## 2022-11-13 NOTE — Progress Notes (Signed)
Mobility Specialist - Progress Note   11/13/22 1013  Mobility  Activity Transferred from bed to chair  Level of Assistance Independent  Assistive Device None  Range of Motion/Exercises Active  Activity Response Tolerated well  Mobility Referral Yes  $Mobility charge 1 Mobility  Mobility Specialist Start Time (ACUTE ONLY) 1000  Mobility Specialist Stop Time (ACUTE ONLY) 1013  Mobility Specialist Time Calculation (min) (ACUTE ONLY) 13 min   Pt was found in bed and declined ambulation due to having incontinent BM recently. Agreeable to transfer to recliner chair. Upon standing up found bed soiled and notified NT. Pt was left on recliner chair with NT in room.  Billey Chang Mobility Specialist

## 2022-11-13 NOTE — H&P (View-Only) (Signed)
[facility] Consultation Note  Referring Provider: [facility] Primary Care Physician:  Donita Brooks, MD Primary Gastroenterologist:  Dr. Karilyn Cota  Reason for Consultation:  melena  HPI: Brandon [name] is a 72 y.o. male admitted progressive melena, weakness, drop Hgb ~ 14 to ~ 9 over past few weeks.  No abdominal pain.  Having some constipation.  Using NSAIDs.  No prior endoscopy.  Colonoscopy last 2013, unknown results.   Past Medical History:  Diagnosis Date   Hypertension    Syphilis     Past Surgical History:  Procedure Laterality Date   BACK SURGERY     4 yrs ago   COLONOSCOPY  01/22/2012   Procedure: COLONOSCOPY;  Surgeon: Malissa Hippo, MD;  Location: [facility];  Service: Endoscopy;  Laterality: N/A;  200    Prior to Admission medications   Medication Sig Start Date End Date Taking? Authorizing Provider  feeding supplement (ENSURE ENLIVE / ENSURE PLUS) LIQD Take 237 mLs by mouth 2 (two) times daily between meals. 11/04/22  Yes [name], MD  losartan (COZAAR) 50 MG tablet Take 1 tablet (50 mg total) by mouth daily. Patient not taking: Reported on 11/12/2022 11/08/22   [name], MD  megestrol (MEGACE) 400 MG/10ML suspension Take 10 mLs (400 mg total) by mouth daily. Patient not taking: Reported on 11/12/2022 11/04/22   [name], MD  multivitamin (RENA-VIT) TABS tablet Take 1 tablet by mouth at bedtime. Patient not taking: Reported on 11/12/2022 11/03/22   [name], MD  ondansetron (ZOFRAN-ODT) 8 MG disintegrating tablet Take 1 tablet (8 mg total) by mouth every 8 (eight) hours as needed for nausea or vomiting. Patient not taking: Reported on 11/12/2022 10/20/22   Dione Booze, MD  polyethylene glycol (MIRALAX / GLYCOLAX) 17 g packet Take 17 g by mouth daily as needed for mild constipation. Patient not taking: Reported on 11/12/2022 11/03/22   [name], MD    Current Facility-Administered Medications   Medication Dose Route Frequency Provider Last Rate Last Admin   acetaminophen (TYLENOL) tablet 650 mg  650 mg Oral Q6H PRN Kirby Crigler, Mir M, MD       Or   acetaminophen (TYLENOL) suppository 650 mg  650 mg Rectal Q6H PRN Kirby Crigler, Mir M, MD       albuterol (PROVENTIL) (2.5 MG/3ML) 0.083% nebulizer solution 2.5 mg  2.5 mg Nebulization Q2H PRN Kirby Crigler, Mir M, MD       metoprolol tartrate (LOPRESSOR) injection 5 mg  5 mg Intravenous Q6H PRN Kirby Crigler, Mir M, MD       ondansetron [facility]) tablet 4 mg  4 mg Oral Q6H PRN Kirby Crigler, Mir M, MD       Or   ondansetron [facility]) injection 4 mg  4 mg Intravenous Q6H PRN Kirby Crigler, Mir M, MD       Melene Muller ON 11/16/2022] pantoprazole (PROTONIX) injection 40 mg  40 mg Intravenous Q12H Anthoney Harada, NP       pantoprozole (PROTONIX) 80 mg /NS 100 mL infusion  8 mg/hr Intravenous Continuous Anthoney Harada, NP 10 mL/hr at 11/13/22 0419 8 mg/hr at 11/13/22 0419   traZODone (DESYREL) tablet 25 mg  25 mg Oral QHS PRN Maryln Gottron, MD        Allergies as of 11/12/2022   (No Known Allergies)    Family History  Problem Relation Age of Onset   Diabetes Mother    Hypertension Mother    Breast cancer Mother  Alcohol abuse Father    Aneurysm Father    Breast cancer Daughter    Colon cancer Neg Hx     Social History   Socioeconomic History   Marital status: Legally Separated    Spouse name: Not on file   Number of children: 3   Years of education: 12   Highest education level: High school graduate  Occupational History   Occupation: Retired- IT trainer  Tobacco Use   Smoking status: Every Day    Packs/day: 0.50    Years: 45.00    Additional pack years: 0.00    Total pack years: 22.50    Types: Cigarettes   Smokeless tobacco: Never  Vaping Use   Vaping Use: Never used  Substance and Sexual Activity   Alcohol use: No   Drug use: No   Sexual activity: Yes    Birth control/protection: None  Other Topics Concern   Not on file   Social History Narrative   Grew up in [place], [place]. 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    Married for 17 years. Has 3 children, 11 grandchildren, 4 great grandchildren.   Wife had breast cancer and she died eight years ago. Remarried, wife is younger.    Wears seatbelt.   Eats all food groups.    Drinks 3-4 sodas per day.   Right-handed.   Social Determinants of Health   Financial Resource Strain: Low Risk  (04/08/2018)   Overall Financial Resource Strain (CARDIA)    Difficulty of Paying Living Expenses: Not hard at all  Food Insecurity: No Food Insecurity (10/28/2022)   Hunger Vital Sign    Worried About Running Out of Food in the Last Year: Never true    Ran Out of Food in the Last Year: Never true  Transportation Needs: No Transportation Needs (10/28/2022)   PRAPARE - Administrator, Civil Service (Medical): No    Lack of Transportation (Non-Medical): No  Physical Activity: Insufficiently Active (11/23/2021)   Exercise Vital Sign    Days of Exercise per Week: 2 days    Minutes of Exercise per Session: 60 min  Stress: No Stress Concern Present (11/23/2021)   [employer] of Occupational Health - Occupational Stress Questionnaire    Feeling of Stress : Not at all  Social Connections: Socially Integrated (11/23/2021)   Social Connection and Isolation Panel [NHANES]    Frequency of Communication with Friends and Family: More than three times a week    Frequency of Social Gatherings with Friends and Family: More than three times a week    Attends Religious Services: More than 4 times per year    Active Member of Clubs or Organizations: Yes    Attends Banker Meetings: More than 4 times per year    Marital Status: Married  Catering manager Violence: Not At Risk (10/28/2022)   Humiliation, Afraid, Rape, and Kick questionnaire    Fear of Current or Ex-Partner: No    Emotionally Abused: No    Physically Abused: No    Sexually Abused: No    Review  of Systems: As per HPI, all others negative  Physical Exam: Vital signs in last 24 hours: Temp:  [98 F (36.7 C)-99.1 F (37.3 C)] 98.9 F (37.2 C) (06/19 0902) Pulse Rate:  [64-89] 69 (06/19 0902) Resp:  [17-22] 18 (06/19 0902) BP: (127-140)/(80-88) 140/84 (06/19 0902) SpO2:  [100 %] 100 % (06/19 0902) Weight:  [79.8 kg] 79.8 kg (06/18 1556)  General:   Alert,  thin but not cachectic, pleasant and cooperative in NAD Head:  Normocephalic and atraumatic. Eyes:  Sclera clear, no icterus.   Conjunctiva pale Ears:  Normal auditory acuity. Nose:  No deformity, discharge,  or lesions. Mouth:  No deformity or lesions.  Oropharynx pale and dry Neck:  Supple; no masses or thyromegaly. Lungs:  No respiratory distress Abdomen:  Soft, nontender and nondistended. No masses, hepatosplenomegaly or hernias noted. Without guarding, and without rebound.     Msk:  Symmetrical without gross deformities. Normal posture. Pulses:  Normal pulses noted. Extremities:  Without clubbing or edema. Neurologic:  Alert and  oriented x4;  grossly normal neurologically. Skin:  Intact without significant lesions or rashes. Psych:  Alert and cooperative. Normal mood and affect.   Lab Results: Recent Labs    11/11/22 1455 11/12/22 1627 11/12/22 2010 11/13/22 0514  WBC 6.1 7.2  --  7.4  HGB 10.4* 10.8* 10.7* 9.2*  HCT 32.7* 34.1* 34.2* 29.2*  PLT 536* 559*  --  483*   BMET Recent Labs    11/11/22 1455 11/12/22 1627 11/13/22 0514  NA 138 134* 136  K 4.5 3.7 4.1  CL 102 104 105  CO2 24 19* 25  GLUCOSE 136* 122* 89  BUN 22 25* 24*  CREATININE 0.91 1.07 0.98  CALCIUM 10.5* 10.1 9.5   LFT Recent Labs    11/12/22 1627  PROT 8.3*  ALBUMIN 3.8  AST 53*  ALT 116*  ALKPHOS 97  BILITOT 0.5   PT/INR Recent Labs    11/12/22 2010  LABPROT 14.4  INR 1.1    Studies/Results: No results found.  Impression:   Melena. Acute blood loss anemia. Symptomatic anemia. Prior NSAID  use.  Plan:   NPO. PPI. Serial CBCs. Hydration. Endoscopy today. Risks (bleeding, infection, bowel perforation that could require surgery, sedation-related changes in cardiopulmonary systems), benefits (identification and possible treatment of source of symptoms, exclusion of certain causes of symptoms), and alternatives (watchful waiting, radiographic imaging studies, empiric medical treatment) of upper endoscopy (EGD) were explained to patient/family in detail and patient wishes to proceed.  If endoscopy unrevealing, would need colonoscopy this admission. [facility] will follow.   LOS: 0 days   Soraida Vickers M  11/13/2022, 10:23 AM  Cell [phone] If no answer or after 5 PM call [phone]

## 2022-11-13 NOTE — TOC Progression Note (Signed)
Transition of Care [facility]) - Progression Note    Patient Details  Name: Brandon [name] MRN: [id number] Date of Birth: 30-Sep-1950  [facility] CM/SW Contact  Erin Sons, [place] Phone Number: 11/13/2022, 1:42 PM  Clinical Narrative:      Transition of Care [facility]) - Inpatient Brief Assessment   Patient Details  Name: Brandon [name] MRN: [id number] Date of Birth: 1950-11-05  Transition of Care [facility]) CM/SW Contact:    Erin Sons, LCSW Phone Number: 11/13/2022, 1:42 PM   Transition of Care Asessment: Insurance and Status: Insurance coverage has been reviewed Patient has primary care physician: Yes Home environment has been reviewed: yes Prior level of function:: ind Prior/Current Home Services: No current home services Social Determinants of Health Reivew: SDOH reviewed no interventions necessary Readmission risk has been reviewed: Yes Transition of care needs: no transition of care needs at this time       Expected Discharge Plan and Services                                               Social Determinants of Health (SDOH) Interventions SDOH Screenings   Food Insecurity: No Food Insecurity (11/13/2022)  Housing: Low Risk  (11/13/2022)  Transportation Needs: No Transportation Needs (11/13/2022)  Utilities: Not At Risk (11/13/2022)  Alcohol Screen: Low Risk  (11/23/2021)  Depression (PHQ2-9): Low Risk  (11/23/2021)  Financial Resource Strain: Low Risk  (04/08/2018)  Physical Activity: Insufficiently Active (11/23/2021)  Social Connections: Socially Integrated (11/23/2021)  Stress: No Stress Concern Present (11/23/2021)  Tobacco Use: High Risk (11/13/2022)    Readmission Risk Interventions     No data to display

## 2022-11-14 DIAGNOSIS — Z833 Family history of diabetes mellitus: Secondary | ICD-10-CM | POA: Diagnosis not present

## 2022-11-14 DIAGNOSIS — Z811 Family history of alcohol abuse and dependence: Secondary | ICD-10-CM | POA: Diagnosis not present

## 2022-11-14 DIAGNOSIS — K3189 Other diseases of stomach and duodenum: Secondary | ICD-10-CM | POA: Diagnosis not present

## 2022-11-14 DIAGNOSIS — K922 Gastrointestinal hemorrhage, unspecified: Secondary | ICD-10-CM | POA: Diagnosis present

## 2022-11-14 DIAGNOSIS — K2970 Gastritis, unspecified, without bleeding: Secondary | ICD-10-CM | POA: Diagnosis not present

## 2022-11-14 DIAGNOSIS — Z8249 Family history of ischemic heart disease and other diseases of the circulatory system: Secondary | ICD-10-CM | POA: Diagnosis not present

## 2022-11-14 DIAGNOSIS — I951 Orthostatic hypotension: Secondary | ICD-10-CM | POA: Diagnosis present

## 2022-11-14 DIAGNOSIS — F17210 Nicotine dependence, cigarettes, uncomplicated: Secondary | ICD-10-CM | POA: Diagnosis not present

## 2022-11-14 DIAGNOSIS — E860 Dehydration: Secondary | ICD-10-CM | POA: Diagnosis not present

## 2022-11-14 DIAGNOSIS — D62 Acute posthemorrhagic anemia: Secondary | ICD-10-CM | POA: Diagnosis not present

## 2022-11-14 DIAGNOSIS — K264 Chronic or unspecified duodenal ulcer with hemorrhage: Secondary | ICD-10-CM | POA: Diagnosis not present

## 2022-11-14 DIAGNOSIS — Z803 Family history of malignant neoplasm of breast: Secondary | ICD-10-CM | POA: Diagnosis not present

## 2022-11-14 DIAGNOSIS — I10 Essential (primary) hypertension: Secondary | ICD-10-CM | POA: Diagnosis not present

## 2022-11-14 DIAGNOSIS — R531 Weakness: Secondary | ICD-10-CM | POA: Diagnosis not present

## 2022-11-14 DIAGNOSIS — B9681 Helicobacter pylori [H. pylori] as the cause of diseases classified elsewhere: Secondary | ICD-10-CM | POA: Diagnosis not present

## 2022-11-14 DIAGNOSIS — K5900 Constipation, unspecified: Secondary | ICD-10-CM | POA: Diagnosis not present

## 2022-11-14 DIAGNOSIS — Z79899 Other long term (current) drug therapy: Secondary | ICD-10-CM | POA: Diagnosis not present

## 2022-11-14 DIAGNOSIS — K921 Melena: Secondary | ICD-10-CM | POA: Diagnosis not present

## 2022-11-14 LAB — COMPREHENSIVE METABOLIC PANEL
ALT: 115 U/L — ABNORMAL HIGH (ref 0–44)
AST: 66 U/L — ABNORMAL HIGH (ref 15–41)
Albumin: 3.3 g/dL — ABNORMAL LOW (ref 3.5–5.0)
Alkaline Phosphatase: 94 U/L (ref 38–126)
Anion gap: 7 (ref 5–15)
BUN: 25 mg/dL — ABNORMAL HIGH (ref 8–23)
CO2: 25 mmol/L (ref 22–32)
Calcium: 9.8 mg/dL (ref 8.9–10.3)
Chloride: 105 mmol/L (ref 98–111)
Creatinine, Ser: 1.21 mg/dL (ref 0.61–1.24)
GFR, Estimated: >60 mL/min (ref >60)
Glucose, Bld: 93 mg/dL (ref 70–99)
Potassium: 4 mmol/L (ref 3.5–5.1)
Sodium: 137 mmol/L (ref 135–145)
Total Bilirubin: 0.6 mg/dL (ref 0.3–1.2)
Total Protein: 7 g/dL (ref 6.5–8.1)

## 2022-11-14 LAB — CBC
HCT: 30.8 % — ABNORMAL LOW (ref 39.0–52.0)
Hemoglobin: 9.6 g/dL — ABNORMAL LOW (ref 13.0–17.0)
MCH: 28.8 pg (ref 26.0–34.0)
MCHC: 31.2 g/dL (ref 30.0–36.0)
MCV: 92.5 fL (ref 80.0–100.0)
Platelets: 459 10*3/uL — ABNORMAL HIGH (ref 150–400)
RBC: 3.33 MIL/uL — ABNORMAL LOW (ref 4.22–5.81)
RDW: 13.9 % (ref 11.5–15.5)
WBC: 7.2 10*3/uL (ref 4.0–10.5)
nRBC: 0 % (ref 0.0–0.2)

## 2022-11-14 LAB — CK: Total CK: 49 U/L (ref 49–397)

## 2022-11-14 MED ORDER — SODIUM CHLORIDE 0.9 % IV SOLN: INTRAVENOUS | Status: DC

## 2022-11-14 MED ORDER — DOCUSATE SODIUM 100 MG PO CAPS
100.0000 mg | ORAL_CAPSULE | Freq: Two times a day (BID) | ORAL | Status: DC
  Administered 2022-11-14 – 2022-11-16 (×5): 100 mg via ORAL
  Filled 2022-11-14 (×5): qty 1

## 2022-11-14 MED ORDER — POLYETHYLENE GLYCOL 3350 17 G PO PACK
17.0000 g | PACK | Freq: Once | ORAL | Status: AC
  Administered 2022-11-14: 17 g via ORAL
  Filled 2022-11-14: qty 1

## 2022-11-14 MED ORDER — SODIUM CHLORIDE 0.9 % IV BOLUS
1000.0000 mL | Freq: Once | INTRAVENOUS | Status: AC
  Administered 2022-11-14: 1000 mL via INTRAVENOUS

## 2022-11-14 NOTE — Evaluation (Signed)
Occupational Therapy Evaluation Patient Details Name: Brandon [name] MRN: [id number] DOB: 04-May-1951 Today's Date: 11/14/2022   History of Present Illness Pt is a 72 y.o. male who was recently admitted 6/2 with dx of AKI. He presented to the ED after multi syncopal episodes at home. 5 weeks PTA pt had infected teeth extracted at dentist followed by progressive loss of appetite, weakness, weight loss, and bad taste in his mouth.  Pt readmitted on 6/18 with persistent dark tarry stools, progressive weakness and drop in Hgb.  PMH: lumbar DDD, HTN, syncope, syphilis.   Clinical Impression   Patient is currently requiring assistance with ADLs including up to minimal assist with Lower body ADLs, setup assist with Upper body ADLs,  as well as  close supervsion with functional transfers to toilet.  Current level of function is below patient's typical baseline.  During this evaluation, patient was limited by impaired activity tolerance, and dizziness as well as feeling depleted and weak, all of which has the potential to impact patient's safety and independence during functional mobility, as well as performance for ADLs.  Patient lives with his brother, who, along with multiple close family members is able to provide 24/7 supervision and assistance.  Patient demonstrates good rehab potential, and should benefit from continued skilled occupational therapy services while in acute care to maximize safety, independence and quality of life at home.  Continued occupational therapy services are recommended.  ?      Recommendations for follow up therapy are one component of a multi-disciplinary discharge planning process, led by the attending physician.  Recommendations may be updated based on patient status, additional functional criteria and insurance authorization.   Assistance Recommended at Discharge Intermittent Supervision/Assistance  Patient can return home with the following Assistance with  cooking/housework;Assist for transportation;Help with stairs or ramp for entrance;A little help with bathing/dressing/bathroom    Functional Status Assessment  Patient has had a recent decline in their functional status and demonstrates the ability to make significant improvements in function in a reasonable and predictable amount of time.  Equipment Recommendations  None recommended by OT    Recommendations for Other Services       Precautions / Restrictions Precautions Precautions: Fall Precaution Comments: watch BP (+orthostatics in ED) Restrictions Weight Bearing Restrictions: No      Mobility Bed Mobility Overal bed mobility: Modified Independent         Sit to supine: Modified independent (Device/Increase time)        Transfers                          Balance Overall balance assessment: Mild deficits observed, not formally tested                                         ADL either performed or assessed with clinical judgement   ADL Overall ADL's : Needs assistance/impaired Eating/Feeding: Independent;Sitting   Grooming: Set up;Sitting   Upper Body Bathing: Set up;Sitting   Lower Body Bathing: Minimal assistance;Sit to/from stand   Upper Body Dressing : Set up;Sitting   Lower Body Dressing: Minimal assistance;Sit to/from stand   Toilet Transfer: Min guard;Comfort height toilet;Ambulation   Toileting- Clothing Manipulation and Hygiene: Min guard;Sitting/lateral lean;Sit to/from stand         General ADL Comments: Pt very dizzy and weak as he exited the bathroom. BP:  135/80, HR 101.  Rather limited Evaluation due to pt with limited activity tolerance.     Vision Baseline Vision/History: 0 No visual deficits Ability to See in Adequate Light: 0 Adequate Patient Visual Report: No change from baseline Vision Assessment?: No apparent visual deficits     Perception     Praxis      Pertinent Vitals/Pain Pain  Assessment Pain Assessment: No/denies pain     Hand Dominance Right   Extremity/Trunk Assessment Upper Extremity Assessment Upper Extremity Assessment: Overall WFL for tasks assessed   Lower Extremity Assessment Lower Extremity Assessment: Defer to PT evaluation   Cervical / Trunk Assessment Cervical / Trunk Assessment: Normal   Communication Communication Communication: No difficulties   Cognition Arousal/Alertness: Awake/alert Behavior During Therapy: Flat affect Overall Cognitive Status: Within Functional Limits for tasks assessed                                       General Comments  Did well ambulating bed to bathroom per CNA. OT to room while pt already in bathroom. Pt required close supervision for safety returning to EOB due to dizziness. Pt did endorse that he was pushing hard in bathroom.    Exercises     Shoulder Instructions      Home Living Family/patient expects to be discharged to:: Private residence Living Arrangements:  (Lives with brother. Has 5 sisters nearby) Available Help at Discharge: Available 24 hours/day Type of Home: House   Entrance Stairs-Number of Steps: 2 Entrance Stairs-Rails: Left;Right;Can reach both Home Layout: One level     Bathroom Shower/Tub: Chief Strategy Officer: Standard Bathroom Accessibility: Yes   Home Equipment: Grab bars - tub/shower;Grab bars - toilet;BSC/3in1;Rolling Walker (2 wheels);Cane - single point;Shower seat   Additional Comments: Pt has needed to use any DME, these belonged to sister.      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 Has not resumed working since last hospitalization. ADLs Comments: Son has been assting pt with showers and "everything" since pt home from hospital.        OT Problem List: Decreased activity  tolerance;Impaired balance (sitting and/or standing);Decreased knowledge of use of DME or AE      OT Treatment/Interventions: Self-care/ADL training;Energy conservation;DME and/or AE instruction;Therapeutic activities    OT Goals(Current goals can be found in the care plan section) Acute Rehab OT Goals Patient Stated Goal: Feel better and go home OT Goal Formulation: With patient Time For Goal Achievement: 11/28/22 Potential to Achieve Goals: Good ADL Goals Additional ADL Goal #1: Pt will perform at least tandem ADLs, Mod I, with involve standing for at least 8 min with vitals stable and without report of dizziness. Additional ADL Goal #2: Patient will identify at least 3 energy conservation strategies to employ at home in order to maximize function and quality of life and decrease caregiver burden while preventing exacerbation of symptoms and rehospitalization.  OT Frequency: Min 1X/week    Co-evaluation              AM-PAC OT "6 Clicks" Daily Activity     Outcome Measure Help from another person eating meals?: None Help from another person taking care of personal grooming?: A Little Help from another person toileting, which includes using toliet, bedpan, or  urinal?: A Little Help from another person bathing (including washing, rinsing, drying)?: A Little Help from another person to put on and taking off regular upper body clothing?: A Little Help from another person to put on and taking off regular lower body clothing?: A Little 6 Click Score: 19   End of Session    Activity Tolerance: Treatment limited secondary to medical complications (Comment) (Dizziness after using bathroom) Patient left: in bed;with call bell/phone within reach;Other (comment) (With MD in room)  OT Visit Diagnosis: Dizziness and giddiness (R42);Unsteadiness on feet (R26.81)                Time: 1191-4782 OT Time Calculation (min): 23 min Charges:  OT General Charges $OT Visit: 1 Visit OT  Evaluation $OT Eval Low Complexity: 1 Low OT Treatments $Self Care/Home Management : 8-22 mins  Victorino Dike, OT Acute Rehab Services Office: [phone] 11/14/2022  Theodoro Clock 11/14/2022, 10:31 AM

## 2022-11-14 NOTE — Progress Notes (Signed)
Subjective: Some black stools.  Objective: Vital signs in last 24 hours: Temp:  [98.3 F (36.8 C)-99.1 F (37.3 C)] 99.1 F (37.3 C) (06/20 1110) Pulse Rate:  [64-86] 86 (06/20 1110) Resp:  [16-18] 18 (06/20 1110) BP: (121-143)/(79-92) 143/92 (06/20 1110) SpO2:  [94 %-100 %] 100 % (06/20 1110) Weight change: -0.033 kg Last BM Date : 11/13/22  PE: GEN:  NAD NEURO:  No encephalopathy  Lab Results: CBC    Component Value Date/Time   WBC 7.2 11/14/2022 0017   RBC 3.33 (L) 11/14/2022 0017   HGB 9.6 (L) 11/14/2022 0017   HCT 30.8 (L) 11/14/2022 0017   PLT 459 (H) 11/14/2022 0017   MCV 92.5 11/14/2022 0017   MCH 28.8 11/14/2022 0017   MCHC 31.2 11/14/2022 0017   RDW 13.9 11/14/2022 0017   LYMPHSABS 1,452 11/11/2022 1455   MONOABS 1.0 10/27/2022 2226   EOSABS 12 (L) 11/11/2022 1455   BASOSABS 18 11/11/2022 1455  CMP     Component Value Date/Time   NA 137 11/14/2022 0017   K 4.0 11/14/2022 0017   CL 105 11/14/2022 0017   CO2 25 11/14/2022 0017   GLUCOSE 93 11/14/2022 0017   BUN 25 (H) 11/14/2022 0017   CREATININE 1.21 11/14/2022 0017   CREATININE 0.91 11/11/2022 1455   CALCIUM 9.8 11/14/2022 0017   PROT 7.0 11/14/2022 0017   ALBUMIN 3.3 (L) 11/14/2022 0017   AST 66 (H) 11/14/2022 0017   ALT 115 (H) 11/14/2022 0017   ALKPHOS 94 11/14/2022 0017   BILITOT 0.6 11/14/2022 0017   EGFR 91 11/11/2022 1455   GFRNONAA >60 11/14/2022 0017   GFRNONAA 82 04/06/2020 1552   Assessment:   Acute blood loss anemia. Duodenal ulcer x 2.  Suspect NSAID-mediated. Melena, improving; today's likely passage old blood.  Plan:   PPI (pantoprazole 40 mg po bid). No NSAIDs. Follow CBC. Awaiting gastric biopsies. Hopefully home tomorrow. [facility] will follow.   Freddy Jaksch 11/14/2022, 3:55 PM   Cell [phone] If no answer or after 5 PM call [phone]

## 2022-11-14 NOTE — Progress Notes (Signed)
PROGRESS NOTE    [name]  GNF:[id number] DOB: 06-Jan-1951 DOA: 11/12/2022 PCP: Donita Brooks, MD    Brief Narrative:   Brandon [name] is a 72 y.o. male with past medical history significant for essential hypertension who presented to [facility] ED on 11/12/2022 with persistent dark tarry stools.  Recently hospitalized for syncope, acute renal failure.  Had reported colonoscopy in 2013; but no report available for review.  Denies any antiplatelet/anticoagulant use.  Patient has been reported with fatigue, low blood pressures.  In follow-up, patient was seen by PCP with fingerstick hemoglobin noted to be 9.9.  Patient's home losartan was discontinued and was started on ferrous sulfate twice daily.  The morning thereafter, patient had a large dark BM and was extremely weak and family brought him to the ED for further evaluation management.  Also reports that he had been lying on the bathroom floor for "2 days" and was found by his sibling.  In the ED, temperature 98.0 F, HR 89, RR 18, BP 06/23/1983, SpO2 100% on room air.  WBC 7.2, hemoglobin 10.8, platelets 559.  Sodium 134, potassium 3.7, chloride 104, CO2 19, glucose 122, BUN 25, creatinine 1.07.  AST 53, ALT 116, total bilirubin 0.5.  FOBT positive.  Patient was started on Protonix drip.  EDP discussed with GI, Dr. Levora Angel who will see in consult and requested hospitalist admission.  TRH consulted for admission for further evaluation management for concern of upper GI bleed.   Assessment & Plan:   Upper GI bleed Patient presenting to ED with dark tarry stools for several weeks, no history of NSAID abuse, no use of antiplatelet/anticoagulants.  Hemoglobin 10.8 on admission.  FOBT positive.  Anemia panel with iron 55, TIBC low at 200, ferritin 357, folate 11.5, vitamin B12 431.  GI was consulted and patient underwent EGD with findings of patchy inflammation within the stomach s/p biopsy, noted to nonbleeding cratered  duodenal ulcers. -- GI following, appreciate assistance -- Hgb 10.8>10.7>9.2>10.4>10.1>9.6 -- Protonix 40 mg p.o. twice daily -- CBC in a.m. -- Transfuse for hemoglobin less than 7.0 or active bleeding  Orthostatic hypotension Orthostatic vital signs positive this morning, will give IV fluid bolus followed by continuous infusion. -- Discontinued home losartan -- Continue IV fluid hydration -- Repeat orthostatic vital signs in the a.m.  Essential hypertension Home losartan recently discontinued by PCP.  Has had recent orthostatic hypotension leading to likely syncopal event from recent hospitalization. -- Metoprolol tartrate 5 mg IV every 6 hours as needed elevated BP -- Continue monitor BP closely  Weakness/debility/deconditioning: -- PT/OT evaluation today, currently has PT/OT/aide [facility] at home   DVT prophylaxis: SCDs Start: 11/12/22 1952    Code Status: Full Code Family Communication: No family present at bedside this morning  Disposition Plan:  Level of care: Med-Surg Status is: Observation The patient remains OBS appropriate and will d/c before 2 midnights.    Consultants:  [facility] gastroenterology, Dr. Dulce Sellar  Procedures:  EGD 6/19  Antimicrobials:  None   Subjective: Patient seen examined bedside, resting comfortably.  Lying in bed.  Continues to complain of intermittent dizziness especially on ambulation.  Feels like he needs to to have a bowel movement, feels constipated.  Seen by OT this morning, orthostatic vital signs positive.  Updated patient's niece present at bedside.  Hemoglobin appears stable.  Home losartan was discontinued, will give IV fluid bolus followed by continuous infusion today with repeat orthostatic vital signs tomorrow.  No other questions or  concerns at this time.  Denies headache, no chest pain, no palpitations, no shortness of breath, no fever/chills/night sweats, no nausea/vomiting/diarrhea, no focal weakness, no fatigue, no  cough/congestion, no paresthesias.  No acute events overnight per nursing staff.  Objective: Vitals:   11/13/22 1450 11/13/22 2028 11/14/22 0558 11/14/22 1110  BP: 124/81 121/79 136/81 (!) 143/92  Pulse: 61 66 64 86  Resp: 15 16 16 18   Temp:  98.3 F (36.8 C) 98.7 F (37.1 C) 99.1 F (37.3 C)  TempSrc:  Oral Oral Oral  SpO2: 98% 100% 94% 100%  Weight:      Height:        Intake/Output Summary (Last 24 hours) at 11/14/2022 1252 Last data filed at 11/14/2022 0815 Gross per 24 hour  Intake 923.49 ml  Output 375 ml  Net 548.49 ml   Filed Weights   11/12/22 1511 11/12/22 1556 11/13/22 1310  Weight: 79.8 kg 79.8 kg 79.8 kg    Examination:  Physical Exam: GEN: NAD, alert and oriented x 3, elderly in appearance HEENT: NCAT, PERRL, EOMI, sclera clear, MMM PULM: CTAB w/o wheezes/crackles, normal respiratory effort, on room air CV: RRR w/o M/G/R GI: abd soft, NTND, NABS, no R/G/M MSK: no peripheral edema, muscle strength globally intact 5/5 bilateral upper/lower extremities NEURO: CN II-XII intact, no focal deficits, sensation to light touch intact PSYCH: normal mood/affect Integumentary: dry/intact, no rashes or wounds    Data Reviewed: I have personally reviewed following labs and imaging studies  CBC: Recent Labs  Lab 11/11/22 1455 11/12/22 1627 11/12/22 2010 11/13/22 0514 11/13/22 1241 11/13/22 1746 11/14/22 0017  WBC 6.1 7.2  --  7.4  --   --  7.2  NEUTROABS 4,227  --   --   --   --   --   --   HGB 10.4* 10.8* 10.7* 9.2* 10.4* 10.1* 9.6*  HCT 32.7* 34.1* 34.2* 29.2* 32.7* 32.2* 30.8*  MCV 90.8 91.2  --  92.1  --   --  92.5  PLT 536* 559*  --  483*  --   --  459*   Basic Metabolic Panel: Recent Labs  Lab 11/11/22 1455 11/12/22 1627 11/13/22 0514 11/14/22 0017  NA 138 134* 136 137  K 4.5 3.7 4.1 4.0  CL 102 104 105 105  CO2 24 19* 25 25  GLUCOSE 136* 122* 89 93  BUN 22 25* 24* 25*  CREATININE 0.91 1.07 0.98 1.21  CALCIUM 10.5* 10.1 9.5 9.8    GFR: Estimated Creatinine Clearance: 65 mL/min (by C-G formula based on SCr of 1.21 mg/dL). Liver Function Tests: Recent Labs  Lab 11/11/22 1455 11/12/22 1627 11/14/22 0017  AST 43* 53* 66*  ALT 112* 116* 115*  ALKPHOS  --  97 94  BILITOT 0.4 0.5 0.6  PROT 7.4 8.3* 7.0  ALBUMIN  --  3.8 3.3*   No results for input(s): "LIPASE", "AMYLASE" in the last 168 hours. No results for input(s): "AMMONIA" in the last 168 hours. Coagulation Profile: Recent Labs  Lab 11/12/22 2010  INR 1.1   Cardiac Enzymes: Recent Labs  Lab 11/14/22 0017  CKTOTAL 49   BNP (last 3 results) No results for input(s): "PROBNP" in the last 8760 hours. HbA1C: No results for input(s): "HGBA1C" in the last 72 hours. CBG: No results for input(s): "GLUCAP" in the last 168 hours. Lipid Profile: No results for input(s): "CHOL", "HDL", "LDLCALC", "TRIG", "CHOLHDL", "LDLDIRECT" in the last 72 hours. Thyroid Function Tests: No results for input(s): "TSH", "T4TOTAL", "  FREET4", "T3FREE", "THYROIDAB" in the last 72 hours. Anemia Panel: Recent Labs    11/13/22 0511  VITAMINB12 431  FOLATE 11.5  FERRITIN 357*  TIBC 200*  IRON 55  RETICCTPCT 2.0   Sepsis Labs: No results for input(s): "PROCALCITON", "LATICACIDVEN" in the last 168 hours.  No results found for this or any previous visit (from the past 240 hour(s)).       Radiology Studies: No results found.      Scheduled Meds:  docusate sodium  100 mg Oral BID   pantoprazole  40 mg Oral BID AC   Continuous Infusions:  sodium chloride     lactated ringers Stopped (11/13/22 1442)     LOS: 0 days    Time spent: 52 minutes spent on chart review, discussion with nursing staff, consultants, updating family and interview/physical exam; more than 50% of that time was spent in counseling and/or coordination of care.    [name] [place], DO [facility] Available via Epic secure chat 7am-7pm After these hours, please refer to coverage  provider listed on [email or web address] 11/14/2022, 12:52 PM

## 2022-11-14 NOTE — Care Management Obs Status (Signed)
MEDICARE OBSERVATION STATUS NOTIFICATION   Patient Details  Name: Brandon [name] MRN: [id number] Date of Birth: [birth year]   Medicare Observation Status Notification Given:  Yes    Adrian Prows, RN 11/14/2022, 9:52 AM

## 2022-11-14 NOTE — TOC Initial Note (Addendum)
Transition of Care [facility]) - Initial/Assessment Note    Patient Details  Name: Brandon [name] MRN: [id number] Date of Birth: 05-13-1951  Transition of Care [facility]) CM/SW Contact:    Adrian Prows, RN Phone Number: 11/14/2022, 10:15 AM  Clinical Narrative:                 TOC  for d/c planning; spoke w/ pt; pt says he is from home and plans to return at d/c; he identified POC [name] Hyacinth Meeker (sister) [phone]; pt says he has transportation; he denies food/housing insecurity, difficulty paying utilities, and IPV; pt says he does not have glasses, dentures, or HA; pt says he has cane, walker, wheel chair, BSC, and shower chair; he says there are grab bars in shower; pt says he does not have home oxygen; he has HHPT/OT/Aide w/ [facility];  pt says he would like to resume services at d/c; notified Cory at agency of pt location;awaiting PT/OT evals; TOC will follow.  Expected Discharge Plan: Home/Self Care Barriers to Discharge: Continued Medical Work up   Patient Goals and CMS Choice Patient states their goals for this hospitalization and ongoing recovery are:: home          Expected Discharge Plan and Services   Discharge Planning Services: CM Consult Post Acute Care Choice: Durable Medical Equipment, Home Health, Resumption of Svcs/PTA Provider [facility] for HHPT/OT/Aide w/ [facility]; pt has cane, walker,  wheelchair, BSC, shower chair, grab bars) Living arrangements for the past 2 months: Single Family Home                                   Representative spoke with at [facility] Agency: Kandee Keen 11/14/22  Prior Living Arrangements/Services Living arrangements for the past 2 months: Single Family Home Lives with:: Siblings Patient language and need for interpreter reviewed:: No Do you feel safe going back to the place where you live?: Yes      Need for Family Participation in Patient Care: Yes (Comment) Care giver support system in place?: Yes (comment) Current home services: DME,  Homehealth aide, Home OT, Home PT ([facility] for HHPT/OT/Aide w/ [facility]; pt has cane, walker, wheelchair, BSC, shower chair, grab bars) Criminal Activity/Legal Involvement Pertinent to Current Situation/Hospitalization: No - Comment as needed  Activities of Daily Living Home Assistive Devices/Equipment: None ADL Screening (condition at time of admission) Patient's cognitive ability adequate to safely complete daily activities?: Yes Is the patient deaf or have difficulty hearing?: No Does the patient have difficulty seeing, even when wearing glasses/contacts?: No Does the patient have difficulty concentrating, remembering, or making decisions?: No Patient able to express need for assistance with ADLs?: Yes Does the patient have difficulty dressing or bathing?: No Independently performs ADLs?: Yes (appropriate for developmental age) Does the patient have difficulty walking or climbing stairs?: No Weakness of Legs: None Weakness of Arms/Hands: None  Permission Sought/Granted Permission sought to share information with : Case Manager Permission granted to share information with : Yes, Verbal Permission Granted  Share Information with NAME: Case Manager     Permission granted to share info w Relationship: [name] Hyacinth Meeker (sister)     Emotional Assessment Appearance:: Appears stated age Attitude/Demeanor/Rapport: Gracious Affect (typically observed): Accepting Orientation: : Oriented to Self, Oriented to Place, Oriented to  Time, Oriented to Situation Alcohol / Substance Use: Not Applicable Psych Involvement: No (comment)  Admission diagnosis:  Melena [K92.1] Upper GI bleed [K92.2] Patient Active Problem  List   Diagnosis Date Noted   Upper GI bleed 11/12/2022   Dehydration 11/02/2022   Dizziness 11/02/2022   Anemia 10/30/2022   Hypokalemia 10/30/2022   Orthostatic hypotension 10/30/2022   AKI (acute kidney injury) (HCC) 10/28/2022   Syncope 10/28/2022   Elevated LFTs 10/28/2022    Retinal vascular abnormality 09/24/2017   Hypertension    Tobacco abuse 09/05/2017   Essential hypertension 09/05/2017   Prediabetes 08/22/2017   Syphilis 08/22/2017   HTN, goal below 140/90 08/22/2017   Rectal bleeding 12/30/2011   PCP:  Donita Brooks, MD Pharmacy:   [facility] [address], [place] - [address] [place] [address] [place] [address] Phone: [phone] Fax: [phone]     Social Determinants of Health (SDOH) Social History: SDOH Screenings   Food Insecurity: No Food Insecurity (11/14/2022)  Housing: Low Risk  (11/14/2022)  Transportation Needs: No Transportation Needs (11/14/2022)  Utilities: Not At Risk (11/14/2022)  Alcohol Screen: Low Risk  (11/23/2021)  Depression (PHQ2-9): Low Risk  (11/23/2021)  Financial Resource Strain: Low Risk  (04/08/2018)  Physical Activity: Insufficiently Active (11/23/2021)  Social Connections: Socially Integrated (11/23/2021)  Stress: No Stress Concern Present (11/23/2021)  Tobacco Use: High Risk (11/13/2022)   SDOH Interventions: Food Insecurity Interventions: Intervention Not Indicated, Inpatient TOC Housing Interventions: Intervention Not Indicated, Inpatient TOC Transportation Interventions: Intervention Not Indicated, Inpatient TOC Utilities Interventions: Intervention Not Indicated, Inpatient TOC   Readmission Risk Interventions     No data to display

## 2022-11-15 DIAGNOSIS — K922 Gastrointestinal hemorrhage, unspecified: Secondary | ICD-10-CM | POA: Diagnosis not present

## 2022-11-15 LAB — COMPREHENSIVE METABOLIC PANEL
ALT: 101 U/L — ABNORMAL HIGH (ref 0–44)
AST: 55 U/L — ABNORMAL HIGH (ref 15–41)
Albumin: 2.9 g/dL — ABNORMAL LOW (ref 3.5–5.0)
Alkaline Phosphatase: 83 U/L (ref 38–126)
Anion gap: 5 (ref 5–15)
BUN: 22 mg/dL (ref 8–23)
CO2: 24 mmol/L (ref 22–32)
Calcium: 9 mg/dL (ref 8.9–10.3)
Chloride: 108 mmol/L (ref 98–111)
Creatinine, Ser: 1.01 mg/dL (ref 0.61–1.24)
GFR, Estimated: >60 mL/min (ref >60)
Glucose, Bld: 88 mg/dL (ref 70–99)
Potassium: 3.6 mmol/L (ref 3.5–5.1)
Sodium: 137 mmol/L (ref 135–145)
Total Bilirubin: 0.8 mg/dL (ref 0.3–1.2)
Total Protein: 6.3 g/dL — ABNORMAL LOW (ref 6.5–8.1)

## 2022-11-15 LAB — CBC
HCT: 26.8 % — ABNORMAL LOW (ref 39.0–52.0)
Hemoglobin: 8.4 g/dL — ABNORMAL LOW (ref 13.0–17.0)
MCH: 29.3 pg (ref 26.0–34.0)
MCHC: 31.3 g/dL (ref 30.0–36.0)
MCV: 93.4 fL (ref 80.0–100.0)
Platelets: 400 10*3/uL (ref 150–400)
RBC: 2.87 MIL/uL — ABNORMAL LOW (ref 4.22–5.81)
RDW: 14 % (ref 11.5–15.5)
WBC: 6.7 10*3/uL (ref 4.0–10.5)
nRBC: 0 % (ref 0.0–0.2)

## 2022-11-15 LAB — SURGICAL PATHOLOGY

## 2022-11-15 MED ORDER — POTASSIUM CHLORIDE CRYS ER 20 MEQ PO TBCR
40.0000 meq | EXTENDED_RELEASE_TABLET | Freq: Once | ORAL | Status: AC
  Administered 2022-11-15: 40 meq via ORAL
  Filled 2022-11-15: qty 2

## 2022-11-15 MED ORDER — SODIUM CHLORIDE 0.9 % IV SOLN: INTRAVENOUS | Status: AC

## 2022-11-15 MED ORDER — PHENOL 1.4 % MT LIQD: 1.0000 | OROMUCOSAL | Status: DC | PRN

## 2022-11-15 MED ORDER — SENNOSIDES-DOCUSATE SODIUM 8.6-50 MG PO TABS
1.0000 | ORAL_TABLET | Freq: Two times a day (BID) | ORAL | Status: DC
  Administered 2022-11-15 – 2022-11-16 (×3): 1 via ORAL
  Filled 2022-11-15 (×3): qty 1

## 2022-11-15 NOTE — Progress Notes (Signed)
Subjective: No further bleeding.  Objective: Vital signs in last 24 hours: Temp:  [97.8 F (36.6 C)-98.6 F (37 C)] 98.6 F (37 C) (06/21 0500) Pulse Rate:  [58-70] 58 (06/21 0500) Resp:  [16-18] 18 (06/21 0500) BP: (138-140)/(74-89) 138/74 (06/21 0500) SpO2:  [99 %-100 %] 99 % (06/21 0500) Weight change:  Last BM Date : 11/13/22  PE: GEN:  NAD ABD:  Soft, non-tender  Lab Results: CBC    Component Value Date/Time   WBC 6.7 11/15/2022 0532   RBC 2.87 (L) 11/15/2022 0532   HGB 8.4 (L) 11/15/2022 0532   HCT 26.8 (L) 11/15/2022 0532   PLT 400 11/15/2022 0532   MCV 93.4 11/15/2022 0532   MCH 29.3 11/15/2022 0532   MCHC 31.3 11/15/2022 0532   RDW 14.0 11/15/2022 0532   LYMPHSABS 1,452 11/11/2022 1455   MONOABS 1.0 10/27/2022 2226   EOSABS 12 (L) 11/11/2022 1455   BASOSABS 18 11/11/2022 1455  CMP     Component Value Date/Time   NA 137 11/15/2022 0532   K 3.6 11/15/2022 0532   CL 108 11/15/2022 0532   CO2 24 11/15/2022 0532   GLUCOSE 88 11/15/2022 0532   BUN 22 11/15/2022 0532   CREATININE 1.01 11/15/2022 0532   CREATININE 0.91 11/11/2022 1455   CALCIUM 9.0 11/15/2022 0532   PROT 6.3 (L) 11/15/2022 0532   ALBUMIN 2.9 (L) 11/15/2022 0532   AST 55 (H) 11/15/2022 0532   ALT 101 (H) 11/15/2022 0532   ALKPHOS 83 11/15/2022 0532   BILITOT 0.8 11/15/2022 0532   EGFR 91 11/11/2022 1455   GFRNONAA >60 11/15/2022 0532   GFRNONAA 82 04/06/2020 1552   Assessment:  Acute blood loss anemia. Duodenal ulcer x 2.  Suspect NSAID-mediated. Resolved melena.  I suspect drop Hgb is re-equilibration.  Plan:   Advance diet as tolerated. Awaiting gastric biopsies (don't need results before discharge). Pantoprazole 40 mg po bid until further notice. No ASA/NSAIDs. We will arrange outpatient GI follow-up. [facility] will sign-off; please call with questions; thank you for the consultation.   Freddy Jaksch 11/15/2022, 11:54 AM   Cell [phone] If no answer or after 5 PM  call [phone]

## 2022-11-15 NOTE — Progress Notes (Addendum)
PROGRESS NOTE    Brandon [name]  JXB:[id number] DOB: 10-09-1950 DOA: 11/12/2022 PCP: Donita Brooks, MD    Brief Narrative:   Brandon [name] is a 72 y.o. male with past medical history significant for essential hypertension who presented to [facility] ED on 11/12/2022 with persistent dark tarry stools.  Recently hospitalized for syncope, acute renal failure.  Had reported colonoscopy in 2013; but no report available for review.  Denies any antiplatelet/anticoagulant use.  Patient has been reported with fatigue, low blood pressures.  In follow-up, patient was seen by PCP with fingerstick hemoglobin noted to be 9.9.  Patient's home losartan was discontinued and was started on ferrous sulfate twice daily.  The morning thereafter, patient had a large dark BM and was extremely weak and family brought him to the ED for further evaluation management.  Also reports that he had been lying on the bathroom floor for "2 days" and was found by his sibling.  In the ED, temperature 98.0 F, HR 89, RR 18, BP 06/23/1983, SpO2 100% on room air.  WBC 7.2, hemoglobin 10.8, platelets 559.  Sodium 134, potassium 3.7, chloride 104, CO2 19, glucose 122, BUN 25, creatinine 1.07.  AST 53, ALT 116, total bilirubin 0.5.  FOBT positive.  Patient was started on Protonix drip.  EDP discussed with GI, Dr. Levora Angel who will see in consult and requested hospitalist admission.  TRH consulted for admission for further evaluation management for concern of upper GI bleed.   Assessment & Plan:   Upper GI bleed Patient presenting to ED with dark tarry stools for several weeks, no history of NSAID abuse, no use of antiplatelet/anticoagulants.  Hemoglobin 10.8 on admission.  FOBT positive.  Anemia panel with iron 55, TIBC low at 200, ferritin 357, folate 11.5, vitamin B12 431.  GI was consulted and patient underwent EGD with findings of patchy inflammation within the stomach s/p biopsy, noted to nonbleeding cratered  duodenal ulcers. -- GI following, appreciate assistance -- Hgb 10.8>10.7>9.2>10.4>10.1>9.6>8.4 -- Protonix 40 mg p.o. twice daily -- Pathology: Pending -- CBC in a.m. -- Transfuse for hemoglobin less than 7.0 or active bleeding  Orthostatic hypotension Continues with slight orthostatic hypotension with repeat vital signs this morning.  Although now asymptomatic. -- Discontinued home losartan; likely role discontinue on discharge -- Continue IV fluid hydration -- Repeat orthostatic vital signs in the a.m.  Essential hypertension Home losartan recently discontinued by PCP.  Has had recent orthostatic hypotension leading to likely syncopal event from recent hospitalization. -- Metoprolol tartrate 5 mg IV every 6 hours as needed elevated BP -- Continue monitor BP closely  Weakness/debility/deconditioning: -- PT/OT evaluation, currently has PT/OT/aide through [facility] at home   DVT prophylaxis: SCDs Start: 11/12/22 1952    Code Status: Full Code Family Communication: No family present at bedside this morning, updated niece present at bedside yesterday  Disposition Plan:  Level of care: Med-Surg Status is: Inpatient Remains inpatient appropriate because: IV fluid hydration, anticipate discharge home likely tomorrow      Consultants:  [facility] gastroenterology, Dr. Dulce Sellar  Procedures:  EGD 6/19  Antimicrobials:  None   Subjective: Patient seen examined bedside, resting comfortably.  Lying in bed.  Orthostatic vital signs this morning improved from yesterday.  Will continue IV fluid hydration and encourage increased oral intake.  Patient very appreciative of all the care he is receiving in the hospital.  No other specific complaints or concerns at this time.  Denies headache, dizziness, no chest pain, no palpitations, no  shortness of breath, no fever/chills/night sweats, no nausea/vomiting/diarrhea, no focal weakness, no fatigue, no cough/congestion, no paresthesias.  No acute  events overnight per nursing staff.  Objective: Vitals:   11/14/22 0558 11/14/22 1110 11/14/22 2159 11/15/22 0500  BP: 136/81 (!) 143/92 (!) 140/89 138/74  Pulse: 64 86 70 (!) 58  Resp: 16 18 16 18   Temp: 98.7 F (37.1 C) 99.1 F (37.3 C) 97.8 F (36.6 C) 98.6 F (37 C)  TempSrc: Oral Oral Oral Oral  SpO2: 94% 100% 100% 99%  Weight:      Height:        Intake/Output Summary (Last 24 hours) at 11/15/2022 1157 Last data filed at 11/15/2022 0907 Gross per 24 hour  Intake 0 ml  Output 425 ml  Net -425 ml   Filed Weights   11/12/22 1511 11/12/22 1556 11/13/22 1310  Weight: 79.8 kg 79.8 kg 79.8 kg    Examination:  Physical Exam: GEN: NAD, alert and oriented x 3, elderly in appearance HEENT: NCAT, PERRL, EOMI, sclera clear, MMM PULM: CTAB w/o wheezes/crackles, normal respiratory effort, on room air CV: RRR w/o M/G/R GI: abd soft, NTND, NABS, no R/G/M MSK: no peripheral edema, muscle strength globally intact 5/5 bilateral upper/lower extremities NEURO: CN II-XII intact, no focal deficits, sensation to light touch intact PSYCH: normal mood/affect Integumentary: dry/intact, no rashes or wounds    Data Reviewed: I have personally reviewed following labs and imaging studies  CBC: Recent Labs  Lab 11/11/22 1455 11/12/22 1627 11/12/22 2010 11/13/22 0514 11/13/22 1241 11/13/22 1746 11/14/22 0017 11/15/22 0532  WBC 6.1 7.2  --  7.4  --   --  7.2 6.7  NEUTROABS 4,227  --   --   --   --   --   --   --   HGB 10.4* 10.8*   < > 9.2* 10.4* 10.1* 9.6* 8.4*  HCT 32.7* 34.1*   < > 29.2* 32.7* 32.2* 30.8* 26.8*  MCV 90.8 91.2  --  92.1  --   --  92.5 93.4  PLT 536* 559*  --  483*  --   --  459* 400   < > = values in this interval not displayed.   Basic Metabolic Panel: Recent Labs  Lab 11/11/22 1455 11/12/22 1627 11/13/22 0514 11/14/22 0017 11/15/22 0532  NA 138 134* 136 137 137  K 4.5 3.7 4.1 4.0 3.6  CL 102 104 105 105 108  CO2 24 19* 25 25 24   GLUCOSE 136* 122*  89 93 88  BUN 22 25* 24* 25* 22  CREATININE 0.91 1.07 0.98 1.21 1.01  CALCIUM 10.5* 10.1 9.5 9.8 9.0   GFR: Estimated Creatinine Clearance: 77.9 mL/min (by C-G formula based on SCr of 1.01 mg/dL). Liver Function Tests: Recent Labs  Lab 11/11/22 1455 11/12/22 1627 11/14/22 0017 11/15/22 0532  AST 43* 53* 66* 55*  ALT 112* 116* 115* 101*  ALKPHOS  --  97 94 83  BILITOT 0.4 0.5 0.6 0.8  PROT 7.4 8.3* 7.0 6.3*  ALBUMIN  --  3.8 3.3* 2.9*   No results for input(s): "LIPASE", "AMYLASE" in the last 168 hours. No results for input(s): "AMMONIA" in the last 168 hours. Coagulation Profile: Recent Labs  Lab 11/12/22 2010  INR 1.1   Cardiac Enzymes: Recent Labs  Lab 11/14/22 0017  CKTOTAL 49   BNP (last 3 results) No results for input(s): "PROBNP" in the last 8760 hours. HbA1C: No results for input(s): "HGBA1C" in the last 72 hours.  CBG: No results for input(s): "GLUCAP" in the last 168 hours. Lipid Profile: No results for input(s): "CHOL", "HDL", "LDLCALC", "TRIG", "CHOLHDL", "LDLDIRECT" in the last 72 hours. Thyroid Function Tests: No results for input(s): "TSH", "T4TOTAL", "FREET4", "T3FREE", "THYROIDAB" in the last 72 hours. Anemia Panel: Recent Labs    11/13/22 0511  VITAMINB12 431  FOLATE 11.5  FERRITIN 357*  TIBC 200*  IRON 55  RETICCTPCT 2.0   Sepsis Labs: No results for input(s): "PROCALCITON", "LATICACIDVEN" in the last 168 hours.  No results found for this or any previous visit (from the past 240 hour(s)).       Radiology Studies: No results found.      Scheduled Meds:  docusate sodium  100 mg Oral BID   pantoprazole  40 mg Oral BID AC   senna-docusate  1 tablet Oral BID   Continuous Infusions:  sodium chloride 150 mL/hr at 11/15/22 0929   lactated ringers Stopped (11/13/22 1442)     LOS: 1 day    Time spent: 52 minutes spent on chart review, discussion with nursing staff, consultants, updating family and interview/physical exam;  more than 50% of that time was spent in counseling and/or coordination of care.    [name] [place], DO [facility] Available via Epic secure chat 7am-7pm After these hours, please refer to coverage provider listed on [email or web address] 11/15/2022, 11:57 AM

## 2022-11-15 NOTE — Evaluation (Signed)
Physical Therapy Evaluation Patient Details Name: Brandon [name] MRN: [id number] DOB: 01-02-1951 Today's Date: 11/15/2022  History of Present Illness  Pt is a 72 y.o. male who was recently admitted 6/2 with dx of AKI. He presented to the ED after multi syncopal episodes at home. 5 weeks PTA pt had infected teeth extracted at dentist followed by progressive loss of appetite, weakness, weight loss, and bad taste in his mouth.  Pt readmitted on 6/18 with persistent dark tarry stools, progressive weakness and drop in Hgb.  PMH: lumbar DDD, HTN, syncope, syphilis.  Clinical Impression  Pt admitted with above diagnosis. Pt from home with brother, reports having lots of family support. Pt reports low energy and fatigue limiting activity tolerance. Pt performs bed mobility and transfers without AD at modified independent, no physical assist or cues. Pt denies dizziness in all positions. Pt able to take a few steps at bedside without AD, no overt LOB, declines further ambulation due to low energy level. Recommend resume HH services at d/c. Pt currently with functional limitations due to the deficits listed below (see PT Problem List). Pt will benefit from acute skilled PT to increase their independence and safety with mobility to allow discharge.       Blood Pressure recorded below, pt denies dizziness, lightheadedness in all positions. Supine: 121/74 Sitting: 125/92 Standing initial: 116/85 Standing 3 min: 129/87      Recommendations for follow up therapy are one component of a multi-disciplinary discharge planning process, led by the attending physician.  Recommendations may be updated based on patient status, additional functional criteria and insurance authorization.  Follow Up Recommendations       Assistance Recommended at Discharge PRN  Patient can return home with the following  Assistance with cooking/housework;Assist for transportation;Help with stairs or ramp for entrance     Equipment Recommendations None recommended by PT  Recommendations for Other Services       Functional Status Assessment Patient has had a recent decline in their functional status and demonstrates the ability to make significant improvements in function in a reasonable and predictable amount of time.     Precautions / Restrictions Precautions Precautions: Fall Precaution Comments: watch BP (+orthostatics in ED) Restrictions Weight Bearing Restrictions: No      Mobility  Bed Mobility Overal bed mobility: Modified Independent                  Transfers Overall transfer level: Modified independent Equipment used: None               General transfer comment: able to stand statically for 3 minutes without symptoms and good steadiness, takes a few steps at bedside, declines further ambulation    Ambulation/Gait                  Stairs            Wheelchair Mobility    Modified Rankin (Stroke Patients Only)       Balance Overall balance assessment: No apparent balance deficits (not formally assessed)                                           Pertinent Vitals/Pain Pain Assessment Pain Assessment: No/denies pain    Home Living Family/patient expects to be discharged to:: Private residence Living Arrangements: Other (Comment) (Lives with brother. Has 5 sisters nearby) Available Help at Discharge:  Available 24 hours/day Type of Home: House Home Access: Stairs to enter Entrance Stairs-Rails: Left;Right;Can reach both Entrance Stairs-Number of Steps: 2   Home Layout: One level Home Equipment: Grab bars - tub/shower;Grab bars - toilet;BSC/3in1;Rolling Walker (2 wheels);Cane - single point;Shower seat Additional Comments: Pt has not needed to use any DME, these belonged to sister.    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 Has not resumed working since last hospitalization. Ind with ambulation. ADLs Comments: Son has been assting pt with showers and "everything" since pt home from hospital.     Hand Dominance   Dominant Hand: Right    Extremity/Trunk Assessment   Upper Extremity Assessment Upper Extremity Assessment: Defer to OT evaluation    Lower Extremity Assessment Lower Extremity Assessment: Overall WFL for tasks assessed    Cervical / Trunk Assessment Cervical / Trunk Assessment: Normal  Communication   Communication: No difficulties  Cognition Arousal/Alertness: Awake/alert Behavior During Therapy: Flat affect Overall Cognitive Status: Within Functional Limits for tasks assessed                                          General Comments General comments (skin integrity, edema, etc.): Pt denies dizziness in all positions, BP recorded in note.    Exercises     Assessment/Plan    PT Assessment Patient needs continued PT services  PT Problem List Decreased activity tolerance;Decreased balance;Decreased knowledge of use of DME       PT Treatment Interventions DME instruction;Gait training;Stair training;Functional mobility training;Therapeutic activities;Therapeutic exercise;Balance training;Neuromuscular re-education;Patient/family education    PT Goals (Current goals can be found in the Care Plan section)  Acute Rehab PT Goals Patient Stated Goal: regain strength and energy PT Goal Formulation: With patient Time For Goal Achievement: 11/29/22 Potential to Achieve Goals: Good    Frequency Min 1X/week     Co-evaluation               AM-PAC PT "6 Clicks" Mobility  Outcome Measure Help needed turning from your back to your side while in a flat bed without using bedrails?: None Help needed moving from lying on your back to sitting on the side of a flat bed without using bedrails?: None Help needed moving to and from a  bed to a chair (including a wheelchair)?: None Help needed standing up from a chair using your arms (e.g., wheelchair or bedside chair)?: None Help needed to walk in hospital room?: A Little Help needed climbing 3-5 steps with a railing? : A Little 6 Click Score: 22    End of Session   Activity Tolerance: Patient tolerated treatment well Patient left: in bed;with call bell/phone within reach;with bed alarm set Nurse Communication: Mobility status PT Visit Diagnosis: Other abnormalities of gait and mobility (R26.89)    Time: 4034-7425 PT Time Calculation (min) (ACUTE ONLY): 21 min   Charges:   PT Evaluation $PT Eval Low Complexity: 1 Low          Tori Yana Schorr PT, DPT 11/15/22, 10:45 AM

## 2022-11-16 DIAGNOSIS — K922 Gastrointestinal hemorrhage, unspecified: Secondary | ICD-10-CM | POA: Diagnosis not present

## 2022-11-16 DIAGNOSIS — A048 Other specified bacterial intestinal infections: Secondary | ICD-10-CM | POA: Diagnosis present

## 2022-11-16 LAB — BASIC METABOLIC PANEL
Anion gap: 6 (ref 5–15)
BUN: 16 mg/dL (ref 8–23)
CO2: 19 mmol/L — ABNORMAL LOW (ref 22–32)
Calcium: 9.3 mg/dL (ref 8.9–10.3)
Chloride: 109 mmol/L (ref 98–111)
Creatinine, Ser: 0.95 mg/dL (ref 0.61–1.24)
GFR, Estimated: >60 mL/min (ref >60)
Glucose, Bld: 104 mg/dL — ABNORMAL HIGH (ref 70–99)
Potassium: 3.5 mmol/L (ref 3.5–5.1)
Sodium: 134 mmol/L — ABNORMAL LOW (ref 135–145)

## 2022-11-16 LAB — CBC
HCT: 27.8 % — ABNORMAL LOW (ref 39.0–52.0)
Hemoglobin: 8.6 g/dL — ABNORMAL LOW (ref 13.0–17.0)
MCH: 29.2 pg (ref 26.0–34.0)
MCHC: 30.9 g/dL (ref 30.0–36.0)
MCV: 94.2 fL (ref 80.0–100.0)
Platelets: 387 10*3/uL (ref 150–400)
RBC: 2.95 MIL/uL — ABNORMAL LOW (ref 4.22–5.81)
RDW: 13.9 % (ref 11.5–15.5)
WBC: 7.9 10*3/uL (ref 4.0–10.5)
nRBC: 0 % (ref 0.0–0.2)

## 2022-11-16 LAB — MAGNESIUM: Magnesium: 2 mg/dL (ref 1.7–2.4)

## 2022-11-16 MED ORDER — CLARITHROMYCIN 500 MG PO TABS: 500.0000 mg | ORAL_TABLET | Freq: Two times a day (BID) | ORAL | 0 refills | Status: AC

## 2022-11-16 MED ORDER — ORAL CARE MOUTH RINSE: 15.0000 mL | OROMUCOSAL | Status: DC | PRN

## 2022-11-16 MED ORDER — POLYETHYLENE GLYCOL 3350 17 G PO PACK: 17.0000 g | PACK | Freq: Every day | ORAL | 0 refills | Status: DC | PRN

## 2022-11-16 MED ORDER — POLYETHYLENE GLYCOL 3350 17 G PO PACK
17.0000 g | PACK | Freq: Every day | ORAL | Status: DC | PRN
  Administered 2022-11-16: 17 g via ORAL
  Filled 2022-11-16: qty 1

## 2022-11-16 MED ORDER — PANTOPRAZOLE SODIUM 40 MG PO TBEC: 40.0000 mg | DELAYED_RELEASE_TABLET | Freq: Two times a day (BID) | ORAL | 2 refills | Status: DC

## 2022-11-16 MED ORDER — METRONIDAZOLE 500 MG PO TABS: 500.0000 mg | ORAL_TABLET | Freq: Three times a day (TID) | ORAL | 0 refills | Status: AC

## 2022-11-16 NOTE — Discharge Summary (Signed)
Physician Discharge Summary  Brandon [name] ION:[id number] DOB: [birth year] DOA: 11/12/2022  PCP: Donita Brooks, MD  Admit date: 11/12/2022 Discharge date: 11/16/2022  Admitted From: Home Disposition: Home  Recommendations for Outpatient Follow-up:  Follow up with PCP in 1-2 weeks Outpatient follow-up with [facility] gastroenterology, Dr. Delton Coombes on Protonix 40 mg p.o. twice daily for duodenal ulcers Discontinued losartan Continue encourage increased hydration due to orthostatic hypotension noted on admission Encourage avoidance of NSAIDs Please obtain BMP/CBC in one week  Home Health: No needs identified Equipment/Devices: None  Discharge Condition: Stable CODE STATUS: Full code Diet recommendation: Regular diet  History of present illness:  Brandon [name] is a 72 y.o. male with past medical history significant for essential hypertension who presented to [facility] ED on 11/12/2022 with persistent dark tarry stools.  Recently hospitalized for syncope, acute renal failure.  Had reported colonoscopy in 2013; but no report available for review.  Denies any antiplatelet/anticoagulant use.  Patient has been reported with fatigue, low blood pressures.  In follow-up, patient was seen by PCP with fingerstick hemoglobin noted to be 9.9.  Patient's home losartan was discontinued and was started on ferrous sulfate twice daily.  The morning thereafter, patient had a large dark BM and was extremely weak and family brought him to the ED for further evaluation management.  Also reports that he had been lying on the bathroom floor for "2 days" and was found by his sibling.   In the ED, temperature 98.0 F, HR 89, RR 18, BP 06/23/1983, SpO2 100% on room air.  WBC 7.2, hemoglobin 10.8, platelets 559.  Sodium 134, potassium 3.7, chloride 104, CO2 19, glucose 122, BUN 25, creatinine 1.07.  AST 53, ALT 116, total bilirubin 0.5.  FOBT positive.  Patient was started on Protonix drip.   EDP discussed with GI, Dr. Levora Angel who will see in consult and requested hospitalist admission.  TRH consulted for admission for further evaluation management for concern of upper GI bleed.  Hospital course:  Upper GI bleed secondary to duodenal ulcers with H. pylori. Patient presenting to ED with dark tarry stools for several weeks, no history of NSAID abuse, no use of antiplatelet/anticoagulants.  Hemoglobin 10.8 on admission.  FOBT positive.  Anemia panel with iron 55, TIBC low at 200, ferritin 357, folate 11.5, vitamin B12 431.  GI was consulted and patient underwent EGD with findings of patchy inflammation within the stomach s/p biopsy, noted to nonbleeding cratered duodenal ulcers.  Biopsies taken with no malignancy identified but positive for H. pylori.  Will continue Protonix for milligrams p.o. twice daily on discharge.  Will start H. pylori treatment with clarithromycin twice daily and metronidazole 3 times daily x 14 days and continues on PPI as above.  Outpatient follow-up with GI.   Orthostatic hypotension Patient notably orthostatic during hospitalization likely secondary to blood loss from GI bleed as above in addition to dehydration.  Supported with aggressive IV fluid resuscitation with improvement and resolution of symptoms.  Encourage patient to maintain adequate hydration.  Discontinued home antihypertensives.  Recommend close outpatient follow-up with PCP.    Essential hypertension Home losartan recently discontinued by PCP.  Has had recent orthostatic hypotension leading to likely syncopal event from recent hospitalization.   Weakness/debility/deconditioning: Resume home PT/OT/aide through [facility]   Discharge Diagnoses:  Principal Problem:   Upper GI bleed Active Problems:   UGIB (upper gastrointestinal bleed)    Discharge Instructions  Discharge Instructions     Call MD for:  difficulty breathing, headache or visual disturbances   Complete by: As directed     Call MD for:  extreme fatigue   Complete by: As directed    Call MD for:  persistant dizziness or light-headedness   Complete by: As directed    Call MD for:  persistant nausea and vomiting   Complete by: As directed    Call MD for:  severe uncontrolled pain   Complete by: As directed    Call MD for:  temperature >100.4   Complete by: As directed    Diet - low sodium heart healthy   Complete by: As directed    Increase activity slowly   Complete by: As directed       Allergies as of 11/16/2022   No Known Allergies      Medication List     STOP taking these medications    losartan 50 MG tablet Commonly known as: COZAAR   megestrol 400 MG/10ML suspension Commonly known as: MEGACE   multivitamin Tabs tablet   ondansetron 8 MG disintegrating tablet Commonly known as: ZOFRAN-ODT       TAKE these medications    clarithromycin 500 MG tablet Commonly known as: BIAXIN Take 1 tablet (500 mg total) by mouth 2 (two) times daily for 14 days.   feeding supplement Liqd Take 237 mLs by mouth 2 (two) times daily between meals.   metroNIDAZOLE 500 MG tablet Commonly known as: Flagyl Take 1 tablet (500 mg total) by mouth 3 (three) times daily for 14 days.   pantoprazole 40 MG tablet Commonly known as: PROTONIX Take 1 tablet (40 mg total) by mouth 2 (two) times daily before a meal.   polyethylene glycol 17 g packet Commonly known as: MIRALAX / GLYCOLAX Take 17 g by mouth daily as needed for mild constipation.        Follow-up Information     Donita Brooks, MD. Schedule an appointment as soon as possible for a visit in 1 week(s).   Specialty: Family Medicine Contact information: [address] [place] [address] [phone]         Willis Modena, MD. Schedule an appointment as soon as possible for a visit.   Specialty: Gastroenterology Contact information: [address] [place] [address] [phone]                 No Known Allergies  Consultations: [facility]   Procedures/Studies: MR BRAIN WO CONTRAST  Result Date: 11/01/2022 CLINICAL DATA:  Syncope/presyncope, cerebrovascular cause suspected EXAM: MRI HEAD WITHOUT CONTRAST TECHNIQUE: Multiplanar, multiecho pulse sequences of the brain and surrounding structures were obtained without intravenous contrast. COMPARISON:  MRI head Oct 01, 2017 without report. FINDINGS: Brain: No acute infarction, hemorrhage, hydrocephalus, extra-axial collection or mass lesion. Vascular: Major arterial flow voids are maintained. Skull and upper cervical spine: Normal marrow signal. Sinuses/Orbits: Clear sinuses.  No acute findings. Other: No mastoid effusions. IMPRESSION: No evidence of acute intracranial abnormality. Electronically Signed   By: Feliberto Harts M.D.   On: 11/01/2022 16:06   NM Hepato W/EF  Result Date: 10/30/2022 CLINICAL DATA:  RIGHT upper quadrant pain. Concern for acalculous cholecystitis. EXAM: NUCLEAR MEDICINE HEPATOBILIARY IMAGING WITH GALLBLADDER EF TECHNIQUE: Sequential images of the abdomen were obtained out to 60 minutes following intravenous administration of radiopharmaceutical. After oral ingestion of Ensure, gallbladder ejection fraction was determined. At 60 min, normal ejection fraction is greater than 33%. RADIOPHARMACEUTICALS:  5.4 mCi Tc-42m  Choletec IV COMPARISON:  None Available. FINDINGS: Prompt clearance radiotracer from blood pool and homogeneous uptake in liver. Gallbladder not visualized at 60 minutes. Gallbladder begins to fill at 75 minutes. Administration of fatty meal demonstrates poor contraction of the gallbladder. Calculated gallbladder ejection fraction is 11%. (Normal gallbladder ejection fraction with Ensure is greater than 33%.) IMPRESSION: 1. Patent cystic duct and common bile duct. 2. Delayed filling of the gallbladder could indicate chronic cholecystitis. 3. Low gallbladder ejection fraction (11%).  Electronically Signed   By: Genevive Bi M.D.   On: 10/30/2022 14:33   ECHOCARDIOGRAM COMPLETE  Result Date: 10/28/2022    ECHOCARDIOGRAM REPORT   Patient Name:   OLUWADAMILOLA ROSAMOND Date of Exam: 10/28/2022 Medical Rec #:  [id number]           Height:       76.0 in Accession #:    [id number]          Weight:       196.9 lb Date of Birth:  06-06-1950           BSA:          2.200 m Patient Age:    71 years            BP:           104/63 mmHg Patient Gender: M                   HR:           68 bpm. Exam Location:  Inpatient Procedure: 2D Echo, Cardiac Doppler and Color Doppler Indications:    Syncope  History:        Patient has no prior history of Echocardiogram examinations.                 Signs/Symptoms:Syncope; Risk Factors:Hypertension.  Sonographer:    Darlys Gales Referring Phys: [id number] TIMOTHY S OPYD IMPRESSIONS  1. Left ventricular ejection fraction, by estimation, is 65 to 70%. The left ventricle has normal function. The left ventricle has no regional wall motion abnormalities. There is mild concentric left ventricular hypertrophy. Left ventricular diastolic parameters are consistent with Grade I diastolic dysfunction (impaired relaxation).  2. Right ventricular systolic function is normal. The right ventricular size is normal.  3. The mitral valve is normal in structure. Trivial mitral valve regurgitation. No evidence of mitral stenosis.  4. The aortic valve is normal in structure. Aortic valve regurgitation is not visualized. No aortic stenosis is present.  5. The inferior vena cava is normal in size with greater than 50% respiratory variability, suggesting right atrial pressure of 3 mmHg.  6. Technically limited study due to poor sound wave transmission. FINDINGS  Left Ventricle: Left ventricular ejection fraction, by estimation, is 65 to 70%. The left ventricle has normal function. The left ventricle has no regional wall motion abnormalities. The left ventricular internal cavity size was normal  in size. There is  mild concentric left ventricular hypertrophy. Left ventricular diastolic parameters are consistent with Grade I diastolic dysfunction (impaired relaxation). Right Ventricle: The right ventricular size is normal. No increase in right ventricular wall thickness. Right ventricular systolic function is normal. Left Atrium: Left atrial size was normal in size. Right Atrium: Right atrial size was normal in size. Pericardium: There is no evidence of pericardial effusion. Mitral Valve: The mitral valve is normal in structure. Trivial mitral valve regurgitation. No evidence of mitral valve stenosis. Tricuspid Valve: The tricuspid valve is normal in structure. Tricuspid valve regurgitation is trivial.  No evidence of tricuspid stenosis. Aortic Valve: The aortic valve is normal in structure. Aortic valve regurgitation is not visualized. No aortic stenosis is present. Aortic valve mean gradient measures 3.0 mmHg. Aortic valve peak gradient measures 5.7 mmHg. Pulmonic Valve: The pulmonic valve was normal in structure. Pulmonic valve regurgitation is trivial. No evidence of pulmonic stenosis. Aorta: The aortic root is normal in size and structure. Venous: The inferior vena cava is normal in size with greater than 50% respiratory variability, suggesting right atrial pressure of 3 mmHg. IAS/Shunts: No atrial level shunt detected by color flow Doppler.  LEFT VENTRICLE PLAX 2D LVIDd:         3.80 cm Diastology LVIDs:         2.70 cm LV e' medial:    6.53 cm/s LV PW:         1.30 cm LV E/e' medial:  8.6 LV IVS:        1.20 cm LV e' lateral:   10.48 cm/s                        LV E/e' lateral: 5.4  RIGHT VENTRICLE RV S prime:     12.70 cm/s TAPSE (M-mode): 1.6 cm LEFT ATRIUM           Index        RIGHT ATRIUM           Index LA Vol (A4C): 43.4 ml 19.72 ml/m  RA Area:     11.70 cm                                    RA Volume:   23.90 ml  10.86 ml/m  AORTIC VALVE AV Vmax:           119.00 cm/s AV Vmean:           82.700 cm/s AV VTI:            0.176 m AV Peak Grad:      5.7 mmHg AV Mean Grad:      3.0 mmHg LVOT Vmax:         91.00 cm/s LVOT Vmean:        69.300 cm/s LVOT VTI:          0.182 m LVOT/AV VTI ratio: 1.03  AORTA Ao Root diam: 3.30 cm Ao Asc diam:  3.70 cm MITRAL VALVE               TRICUSPID VALVE MV Area (PHT): 2.13 cm    TR Peak grad:   28.1 mmHg MV Decel Time: 356 msec    TR Vmax:        265.00 cm/s MV E velocity: 56.10 cm/s MV A velocity: 77.60 cm/s  SHUNTS MV E/A ratio:  0.72        Systemic VTI: 0.18 m Arvilla Meres MD Electronically signed by Arvilla Meres MD Signature Date/Time: 10/28/2022/1:00:12 PM    Final    US Abdomen Limited RUQ (LIVER/GB)  Result Date: 10/28/2022 CLINICAL DATA:  221910 with elevated liver enzymes. 161096 with right upper quadrant pain. EXAM: ULTRASOUND ABDOMEN LIMITED RIGHT UPPER QUADRANT COMPARISON:  CT without contrast earlier today. FINDINGS: Gallbladder: There is intermediate density sludge almost completely filling the gallbladder lumen. No shadowing stones are seen and no free wall thickening. There was no positive sonographic Murphy's sign or pericholecystic fluid. Common bile duct: Diameter:  Within normal limits measuring 2.7 mm with no intrahepatic biliary prominence. Liver: No focal lesion identified. There is mild generalized increased parenchymal echogenicity consistent with steatosis. Portal vein is patent on color Doppler imaging with normal direction of blood flow towards the liver. Other: None. IMPRESSION: 1. Intermediate density sludge almost completely filling the gallbladder lumen. No shadowing stones are seen and there is no sonographic evidence of acute cholecystitis. 2. Normal caliber bile ducts. 3. Mild hepatic steatosis. Electronically Signed   By: Almira Bar M.D.   On: 10/28/2022 06:35   CT ABDOMEN PELVIS WO CONTRAST  Result Date: 10/28/2022 CLINICAL DATA:  Acute abdominal pain, found down EXAM: CT ABDOMEN AND PELVIS WITHOUT CONTRAST  TECHNIQUE: Multidetector CT imaging of the abdomen and pelvis was performed following the standard protocol without IV contrast. Unenhanced CT was performed per clinician order. Lack of IV contrast limits sensitivity and specificity, especially for evaluation of abdominal/pelvic solid viscera. RADIATION DOSE REDUCTION: This exam was performed according to the departmental dose-optimization program which includes automated exposure control, adjustment of the mA and/or kV according to patient size and/or use of iterative reconstruction technique. COMPARISON:  10/20/2022 FINDINGS: Lower chest: No acute pleural or parenchymal lung disease. Hepatobiliary: High attenuation material within the gallbladder may reflect vicarious excretion of previously administered contrast versus gallbladder sludge. No calcified gallstones or cholecystitis. Unremarkable unenhanced appearance of the liver. Pancreas: Unremarkable unenhanced appearance. Spleen: Unremarkable unenhanced appearance. Adrenals/Urinary Tract: No urinary tract calculi or obstructive uropathy within either kidney. The adrenals and bladder are unremarkable. Stomach/Bowel: No bowel obstruction or ileus. Normal appendix right lower quadrant. No bowel wall thickening or inflammatory change. Vascular/Lymphatic: Aortic atherosclerosis. No enlarged abdominal or pelvic lymph nodes. Reproductive: Stable enlargement of the prostate. No focal abnormality. Other: No free fluid or free intraperitoneal gas. No abdominal wall hernia. Musculoskeletal: No acute or destructive bony abnormalities. Postsurgical changes lower lumbar spine. Reconstructed images demonstrate no additional findings. IMPRESSION: 1. High attenuation material filling the gallbladder lumen, likely vicarious excretion of previously administered contrast. Gallbladder sludge could also give this appearance. No evidence of cholelithiasis or cholecystitis. 2. Otherwise unremarkable unenhanced CT of the abdomen and  pelvis. Electronically Signed   By: Sharlet Salina M.D.   On: 10/28/2022 03:22   CT HEAD WO CONTRAST ( )  Result Date: 10/28/2022 CLINICAL DATA:  Syncope EXAM: CT HEAD WITHOUT CONTRAST TECHNIQUE: Contiguous axial images were obtained from the base of the skull through the vertex without intravenous contrast. RADIATION DOSE REDUCTION: This exam was performed according to the departmental dose-optimization program which includes automated exposure control, adjustment of the mA and/or kV according to patient size and/or use of iterative reconstruction technique. COMPARISON:  No prior CT head available, correlation is made with MRI head 10/01/2017 FINDINGS: Brain: No evidence of acute infarction, hemorrhage, mass, mass effect, or midline shift. No hydrocephalus or extra-axial fluid collection. Gray-white differentiation is preserved. The basilar cisterns are patent. Normal cerebral volume for age. Vascular: No hyperdense vessel. Skull: Negative for fracture or focal lesion. Sinuses/Orbits: No acute finding. Other: The mastoid air cells are well aerated. IMPRESSION: No acute intracranial process. Electronically Signed   By: Wiliam Ke M.D.   On: 10/28/2022 03:18   CT ABDOMEN PELVIS W CONTRAST  Result Date: 10/20/2022 CLINICAL DATA:  Left lower quadrant pain. EXAM: CT ABDOMEN AND PELVIS WITH CONTRAST TECHNIQUE: Multidetector CT imaging of the abdomen and pelvis was performed using the standard protocol following bolus administration of intravenous contrast. RADIATION DOSE REDUCTION: This exam was performed according to  the departmental dose-optimization program which includes automated exposure control, adjustment of the mA and/or kV according to patient size and/or use of iterative reconstruction technique. CONTRAST:  OMNIPAQUE IOHEXOL 300 MG/ML  SOLN COMPARISON:  None Available. FINDINGS: Lower chest: No acute abnormality. Hepatobiliary: No focal liver abnormality is seen. The gallbladder is moderately  distended without evidence of gallstones, gallbladder wall thickening or biliary dilatation. Pancreas: Several punctate calcifications are seen within an otherwise normal appearing pancreas. No pancreatic ductal dilatation or surrounding inflammatory changes. Spleen: Normal in size without focal abnormality. Adrenals/Urinary Tract: Adrenal glands are unremarkable. Kidneys are normal, without renal calculi, focal lesion, or hydronephrosis. Bladder is unremarkable. Stomach/Bowel: Stomach is within normal limits. The appendix is not clearly identified. No evidence of bowel wall thickening, distention, or inflammatory changes. A moderate to marked amount of stool is seen within the ascending colon, proximal to mid transverse colon and mid to distal sigmoid colon. Vascular/Lymphatic: Aortic atherosclerosis. No enlarged abdominal or pelvic lymph nodes. Reproductive: Moderate to marked severity prostate gland enlargement is noted. Other: No abdominal wall hernia or abnormality. No abdominopelvic ascites. Musculoskeletal: Postoperative changes are seen within the mid and lower lumbar spine. IMPRESSION: 1. Moderate to marked severity constipation. 2. Prostatomegaly.  Correlation with PSA levels is recommended. 3. Postoperative changes within the mid and lower lumbar spine. 4. Aortic atherosclerosis. Aortic Atherosclerosis (ICD10-I70.0). Electronically Signed   By: Aram Candela M.D.   On: 10/20/2022 02:13     Subjective: Patient seen examined bedside, resting,.  Lying in bed.  No specific complaints.  Able to ambulate to the bathroom this morning without issue.  Dizziness and overall weakness much improved.  Discussed with patient needs to maintain hydration given his orthostasis.  Appreciative all the care he is received in the hospital.  No other questions or concerns at this time.  Denies headache, no dizziness, no chest pain, no palpitations, no shortness of breath, no abdominal pain, no focal weakness, no  fatigue, no cough/congestion, no fever/chills/night sweats, no nausea/vomiting/diarrhea, no paresthesias.  No acute events overnight per nursing staff.  Discharge Exam: Vitals:   11/16/22 0510 11/16/22 1001  BP: (!) 153/82   Pulse: (!) 57   Resp: 17   Temp: 98.7 F (37.1 C)   SpO2: 100% 100%   Vitals:   11/15/22 1318 11/15/22 1945 11/16/22 0510 11/16/22 1001  BP: 130/74 (!) 145/88 (!) 153/82   Pulse: (!) 55 (!) 59 (!) 57   Resp: 15 18 17    Temp: 98.9 F (37.2 C) 97.8 F (36.6 C) 98.7 F (37.1 C)   TempSrc: Oral Oral Oral   SpO2: 99% 100% 100% 100%  Weight:      Height:        Physical Exam: GEN: NAD, alert and oriented x 3, elderly in appearance HEENT: NCAT, PERRL, EOMI, sclera clear, MMM PULM: CTAB w/o wheezes/crackles, normal respiratory effort, on room air CV: RRR w/o M/G/R GI: abd soft, NTND, NABS, no R/G/M MSK: no peripheral edema, muscle strength globally intact 5/5 bilateral upper/lower extremities NEURO: CN II-XII intact, no focal deficits, sensation to light touch intact PSYCH: normal mood/affect Integumentary: dry/intact, no rashes or wounds    The results of significant diagnostics from this hospitalization (including imaging, microbiology, ancillary and laboratory) are listed below for reference.     Microbiology: No results found for this or any previous visit (from the past 240 hour(s)).   Labs: BNP (last 3 results) No results for input(s): "BNP" in the last 8760 hours. Basic Metabolic  Panel: Recent Labs  Lab 11/12/22 1627 11/13/22 0514 11/14/22 0017 11/15/22 0532 11/16/22 0601  NA 134* 136 137 137 134*  K 3.7 4.1 4.0 3.6 3.5  CL 104 105 105 108 109  CO2 19* 25 25 24  19*  GLUCOSE 122* 89 93 88 104*  BUN 25* 24* 25* 22 16  CREATININE 1.07 0.98 1.21 1.01 0.95  CALCIUM 10.1 9.5 9.8 9.0 9.3  MG  --   --   --   --  2.0   Liver Function Tests: Recent Labs  Lab 11/11/22 1455 11/12/22 1627 11/14/22 0017 11/15/22 0532  AST 43* 53* 66* 55*   ALT 112* 116* 115* 101*  ALKPHOS  --  97 94 83  BILITOT 0.4 0.5 0.6 0.8  PROT 7.4 8.3* 7.0 6.3*  ALBUMIN  --  3.8 3.3* 2.9*   No results for input(s): "LIPASE", "AMYLASE" in the last 168 hours. No results for input(s): "AMMONIA" in the last 168 hours. CBC: Recent Labs  Lab 11/11/22 1455 11/12/22 1627 11/12/22 2010 11/13/22 0514 11/13/22 1241 11/13/22 1746 11/14/22 0017 11/15/22 0532 11/16/22 0601  WBC 6.1 7.2  --  7.4  --   --  7.2 6.7 7.9  NEUTROABS 4,227  --   --   --   --   --   --   --   --   HGB 10.4* 10.8*   < > 9.2* 10.4* 10.1* 9.6* 8.4* 8.6*  HCT 32.7* 34.1*   < > 29.2* 32.7* 32.2* 30.8* 26.8* 27.8*  MCV 90.8 91.2  --  92.1  --   --  92.5 93.4 94.2  PLT 536* 559*  --  483*  --   --  459* 400 387   < > = values in this interval not displayed.   Cardiac Enzymes: Recent Labs  Lab 11/14/22 0017  CKTOTAL 49   BNP: Invalid input(s): "POCBNP" CBG: No results for input(s): "GLUCAP" in the last 168 hours. D-Dimer No results for input(s): "DDIMER" in the last 72 hours. Hgb A1c No results for input(s): "HGBA1C" in the last 72 hours. Lipid Profile No results for input(s): "CHOL", "HDL", "LDLCALC", "TRIG", "CHOLHDL", "LDLDIRECT" in the last 72 hours. Thyroid function studies No results for input(s): "TSH", "T4TOTAL", "T3FREE", "THYROIDAB" in the last 72 hours.  Invalid input(s): "FREET3" Anemia work up No results for input(s): "VITAMINB12", "FOLATE", "FERRITIN", "TIBC", "IRON", "RETICCTPCT" in the last 72 hours. Urinalysis    Component Value Date/Time   COLORURINE AMBER (A) 10/28/2022 0318   APPEARANCEUR CLEAR 10/28/2022 0318   LABSPEC 1.024 10/28/2022 0318   PHURINE 5.0 10/28/2022 0318   GLUCOSEU NEGATIVE 10/28/2022 0318   HGBUR NEGATIVE 10/28/2022 0318   BILIRUBINUR SMALL (A) 10/28/2022 0318   KETONESUR 5 (A) 10/28/2022 0318   PROTEINUR 30 (A) 10/28/2022 0318   NITRITE NEGATIVE 10/28/2022 0318   LEUKOCYTESUR NEGATIVE 10/28/2022 0318   Sepsis  Labs Recent Labs  Lab 11/13/22 0514 11/14/22 0017 11/15/22 0532 11/16/22 0601  WBC 7.4 7.2 6.7 7.9   Microbiology No results found for this or any previous visit (from the past 240 hour(s)).   Time coordinating discharge: Over 30 minutes  SIGNED:   [name] [place], DO  [facility] 11/16/2022, 11:12 AM

## 2022-11-16 NOTE — TOC Transition Note (Signed)
Transition of Care [facility]) - CM/SW Discharge Note   Patient Details  Name: Brandon [name] MRN: [id number] Date of Birth: Jun 06, 1950  Transition of Care [facility]) CM/SW Contact:  Adrian Prows, RN Phone Number: 11/16/2022, 10:59 AM   Clinical Narrative:       Final next level of care: Home w Home Health Services Barriers to Discharge: No Barriers Identified   Patient Goals and CMS Choice  D/C orders received; HHPT previously arranged w/ [facility] (HHPT/OT/RN/Aide; Cory at agency notified; no TOC needs.    Discharge Placement                         Discharge Plan and Services Additional resources added to the After Visit Summary for     Discharge Planning Services: CM Consult Post Acute Care Choice: Durable Medical Equipment, Home Health, Resumption of Svcs/PTA Provider [facility] for HHPT/OT/Aide w/ [facility]; pt has cane, walker,  wheelchair, BSC, shower chair, grab bars)                            Representative spoke with at [facility] Agency: Kandee Keen 11/14/22  Social Determinants of Health (SDOH) Interventions SDOH Screenings   Food Insecurity: No Food Insecurity (11/14/2022)  Housing: Low Risk  (11/14/2022)  Transportation Needs: No Transportation Needs (11/14/2022)  Utilities: Not At Risk (11/14/2022)  Alcohol Screen: Low Risk  (11/23/2021)  Depression (PHQ2-9): Low Risk  (11/23/2021)  Financial Resource Strain: Low Risk  (04/08/2018)  Physical Activity: Insufficiently Active (11/23/2021)  Social Connections: Socially Integrated (11/23/2021)  Stress: No Stress Concern Present (11/23/2021)  Tobacco Use: High Risk (11/13/2022)     Readmission Risk Interventions     No data to display

## 2022-11-18 ENCOUNTER — Telehealth: Payer: Self-pay

## 2022-11-18 ENCOUNTER — Encounter (HOSPITAL_COMMUNITY): Payer: Self-pay | Admitting: Gastroenterology

## 2022-11-18 NOTE — Transitions of Care (Post Inpatient/ED Visit) (Signed)
   11/18/2022  Name: Brandon [name] MRN: [id number] DOB: [birth year]  Today's TOC FU Call Status: Today's TOC FU Call Status:: Unsuccessul Call (1st Attempt) Unsuccessful Call (1st Attempt) Date: 11/18/22  Attempted to reach the patient regarding the most recent Inpatient/ED visit.  Follow Up Plan: Additional outreach attempts will be made to reach the patient to complete the Transitions of Care (Post Inpatient/ED visit) call.   Jodelle Gross, RN, BSN, CCM Care Management Coordinator [place] Phone: [phone]/Fax: [phone]

## 2022-11-19 ENCOUNTER — Telehealth: Payer: Self-pay

## 2022-11-19 NOTE — Transitions of Care (Post Inpatient/ED Visit) (Addendum)
11/19/2022  Name: Brandon [name] MRN: [id number] DOB: 1951-04-07  Today's TOC FU Call Status: Today's TOC FU Call Status:: Successful TOC FU Call Competed TOC FU Call Complete Date: 11/19/22  Transition Care Management Follow-up Telephone Call Date of Discharge: 11/16/22 Discharge Facility: [facility]) Type of Discharge: Inpatient Admission Primary Inpatient Discharge Diagnosis:: Upper GI Bleed How have you been since you were released from the hospital?: Better (Per patient's sister, Brandon [name], he is doing very well) Any questions or concerns?: No  Items Reviewed: Did you receive and understand the discharge instructions provided?: Yes Medications obtained,verified, and reconciled?: Yes (Medications Reviewed) Any new allergies since your discharge?: No Dietary orders reviewed?: Yes Type of Diet Ordered:: Regular Do you have support at home?: Yes People in Home: sibling(s) Name of Support/Comfort Primary Source: Brandon [name]  Medications Reviewed Today: Medications Reviewed Today     Reviewed by Jodelle Gross, RN (Case Manager) on 11/19/22 at 1049  Med List Status: <None>   Medication Order Taking? Sig Documenting Provider Last Dose Status Informant  clarithromycin (BIAXIN) 500 MG tablet [id number] Yes Take 1 tablet (500 mg total) by mouth 2 (two) times daily for 14 days. [place], Brandon Philips, DO Taking Active   feeding supplement (ENSURE ENLIVE / ENSURE PLUS) LIQD [id number] Yes Take 237 mLs by mouth 2 (two) times daily between meals. [name], MD Taking Active Self  metroNIDAZOLE (FLAGYL) 500 MG tablet [id number] Yes Take 1 tablet (500 mg total) by mouth 3 (three) times daily for 14 days. [place], Brandon Philips, DO Taking Active   pantoprazole (PROTONIX) 40 MG tablet [id number] Yes Take 1 tablet (40 mg total) by mouth 2 (two) times daily before a meal. [place], Brandon Philips, DO Taking Active   polyethylene glycol (MIRALAX / GLYCOLAX) 17 g packet [id number] Yes Take 17 g by mouth daily as  needed for mild constipation. [place], Brandon Philips, DO Taking Active            Med Note Electa Sniff, [facility]   Tue Nov 19, 2022 10:49 AM) Taking as needed            Home Care and Equipment/Supplies: Were Home Health Services Ordered?: Yes Name of Home Health Agency:: [facility] Has Agency set up a time to come to your home?: Yes First Home Health Visit Date: 11/20/22 Any new equipment or medical supplies ordered?: No  Functional Questionnaire: Do you need assistance with bathing/showering or dressing?: No Do you need assistance with meal preparation?: No Do you need assistance with eating?: No Do you have difficulty maintaining continence: No Do you need assistance with getting out of bed/getting out of a chair/moving?: No Do you have difficulty managing or taking your medications?: No  Follow up appointments reviewed: PCP Follow-up appointment confirmed?: Yes Date of PCP follow-up appointment?: 12/03/22 Follow-up Provider: Dr. Tanya Nones Specialist [facility] Follow-up appointment confirmed?: Yes Date of Specialist follow-up appointment?: 11/20/22 Follow-Up Specialty Provider:: Tana Coast Do you need transportation to your follow-up appointment?: No Do you understand care options if your condition(s) worsen?: Yes-patient verbalized understanding Interventions Today    Flowsheet Row Most Recent Value  Chronic Disease   Chronic disease during today's visit --  [Upper GI Bleed]  General Interventions   General Interventions Discussed/Reviewed General Interventions Discussed       TOC Interventions Today    Flowsheet Row Most Recent Value  TOC Interventions   TOC Interventions Discussed/Reviewed TOC Interventions Discussed, TOC Interventions Reviewed, Arranged PCP follow up less than 12 days/Care Guide scheduled  SDOH Interventions Today    Flowsheet Row Most Recent Value  SDOH Interventions   Food Insecurity Interventions Intervention Not Indicated  Housing  Interventions Intervention Not Indicated  Transportation Interventions Intervention Not Indicated  Utilities Interventions Intervention Not Indicated     Jodelle Gross, RN, BSN, CCM Care Management Coordinator [place] Phone: [phone]/Fax: [phone]

## 2022-11-20 ENCOUNTER — Ambulatory Visit: Payer: Medicare HMO | Admitting: Gastroenterology

## 2022-11-20 NOTE — Progress Notes (Deleted)
GI Office Note    Referring Provider: Donita Brooks, MD Primary Care Physician:  Donita Brooks, MD  Primary Gastroenterologist: previously Dr. Karilyn Cota  Chief Complaint   No chief complaint on file.   History of Present Illness   Brandon [name] is a 72 y.o. male presenting today for hospital follow up. Last seen 2019.  While inpatient earlier this month, he was evaluated by GI (Dr. Dulce Sellar) for melena. Hgb dropped from 14 to 9 over few weeks in setting of NSAID use.   EGD 11/13/22: gastritis, non-bleeding duodenal ulcers likely NSAID mediated, mucosal changes in the duodenum note. 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 28 tablet 0   feeding supplement (ENSURE ENLIVE / ENSURE PLUS) LIQD Take 237 mLs by mouth 2 (two) times daily between meals. 237 mL 12   metroNIDAZOLE (FLAGYL) 500 MG tablet Take 1 tablet (500 mg total) by mouth 3 (three) times daily for 14 days. 42 tablet 0   pantoprazole (PROTONIX) 40 MG tablet Take 1 tablet (40 mg total) by mouth 2 (two) times daily before a meal. 60 tablet 2   polyethylene glycol (MIRALAX / GLYCOLAX) 17 g packet Take 17 g by mouth daily as needed for mild constipation. 14 each 0   No current facility-administered medications for this visit.    Allergies   Allergies as of 11/20/2022   (No Known Allergies)     Past Medical History   Past Medical History:  Diagnosis Date   Hypertension    Syphilis     Past Surgical History   Past Surgical History:  Procedure Laterality Date   BACK SURGERY     4 yrs ago   BIOPSY  11/13/2022   Procedure: BIOPSY;  Surgeon: Willis Modena, MD;  Location: [facility];  Service:  Gastroenterology;;   COLONOSCOPY  01/22/2012   Procedure: COLONOSCOPY;  Surgeon: Malissa Hippo, MD;  Location: [facility];  Service: Endoscopy;  Laterality: N/A;  200   ESOPHAGOGASTRODUODENOSCOPY (EGD) WITH PROPOFOL Left 11/13/2022   Procedure: ESOPHAGOGASTRODUODENOSCOPY (EGD) WITH PROPOFOL;  Surgeon: Willis Modena, MD;  Location: [facility];  Service: Gastroenterology;  Laterality: Left;    Past Family History   Family History  Problem Relation Age of Onset   Diabetes Mother    Hypertension Mother    Breast cancer Mother    Alcohol abuse Father    Aneurysm Father    Breast cancer Daughter    Colon cancer Neg Hx     Past Social History   Social History   Socioeconomic History   Marital status: Legally Separated    Spouse name: Not on file   Number of children: 3   Years of education: 12   Highest education level: High school graduate  Occupational History   Occupation: Retired- IT trainer  Tobacco Use   Smoking status: Every Day    Packs/day: 0.50    Years: 45.00    Additional pack years: 0.00    Total pack years: 22.50    Types: Cigarettes   Smokeless tobacco: Never  Vaping Use   Vaping Use: Never used  Substance and Sexual Activity   Alcohol use: No   Drug use:  No   Sexual activity: Yes    Birth control/protection: None  Other Topics Concern   Not on file  Social History Narrative   Grew up in [place], [place]. 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    Married for 17 years. Has 3 children, 11 grandchildren, 4 great grandchildren.   Wife had breast cancer and she died eight years ago. Remarried, wife is younger.    Wears seatbelt.   Eats all food groups.    Drinks 3-4 sodas per day.   Right-handed.   Social Determinants of Health   Financial Resource Strain: Low Risk  (04/08/2018)   Overall Financial Resource Strain (CARDIA)    Difficulty of Paying Living Expenses: Not hard at all  Food Insecurity: No Food Insecurity (11/19/2022)   Hunger Vital  Sign    Worried About Running Out of Food in the Last Year: Never true    Ran Out of Food in the Last Year: Never true  Transportation Needs: No Transportation Needs (11/19/2022)   PRAPARE - Administrator, Civil Service (Medical): No    Lack of Transportation (Non-Medical): No  Physical Activity: Insufficiently Active (11/23/2021)   Exercise Vital Sign    Days of Exercise per Week: 2 days    Minutes of Exercise per Session: 60 min  Stress: No Stress Concern Present (11/23/2021)   [employer] of Occupational Health - Occupational Stress Questionnaire    Feeling of Stress : Not at all  Social Connections: Socially Integrated (11/23/2021)   Social Connection and Isolation Panel [NHANES]    Frequency of Communication with Friends and Family: More than three times a week    Frequency of Social Gatherings with Friends and Family: More than three times a week    Attends Religious Services: More than 4 times per year    Active Member of [employer] or Organizations: Yes    Attends Engineer, structural: More than 4 times per year    Marital Status: Married  Catering manager Violence: Not At Risk (11/14/2022)   Humiliation, Afraid, Rape, and Kick questionnaire    Fear of Current or Ex-Partner: No    Emotionally Abused: No    Physically Abused: No    Sexually Abused: No    Review of Systems   General: Negative for anorexia, weight loss, fever, chills, fatigue, weakness. ENT: Negative for hoarseness, difficulty swallowing , nasal congestion. CV: Negative for chest pain, angina, palpitations, dyspnea on exertion, peripheral edema.  Respiratory: Negative for dyspnea at rest, dyspnea on exertion, cough, sputum, wheezing.  GI: See history of present illness. GU:  Negative for dysuria, hematuria, urinary incontinence, urinary frequency, nocturnal urination.  Endo: Negative for unusual weight change.     Physical Exam   There were no vitals taken for this visit.   General:  Well-nourished, well-developed in no acute distress.  Eyes: No icterus. Mouth: Oropharyngeal mucosa moist and pink , no lesions erythema or exudate. Lungs: Clear to auscultation bilaterally.  Heart: Regular rate and rhythm, no murmurs rubs or gallops.  Abdomen: Bowel sounds are normal, nontender, nondistended, no hepatosplenomegaly or masses,  no abdominal bruits or hernia , no rebound or guarding.  Rectal: ***  Extremities: No lower extremity edema. No clubbing or deformities. Neuro: Alert and oriented x 4   Skin: Warm and dry, no jaundice.   Psych: Alert and cooperative, normal mood and affect.  Labs   Lab Results  Component Value Date   CREATININE 0.95 11/16/2022  BUN 16 11/16/2022   NA 134 (L) 11/16/2022   K 3.5 11/16/2022   CL 109 11/16/2022   CO2 19 (L) 11/16/2022   Lab Results  Component Value Date   WBC 7.9 11/16/2022   HGB 8.6 (L) 11/16/2022   HCT 27.8 (L) 11/16/2022   MCV 94.2 11/16/2022   PLT 387 11/16/2022   Lab Results  Component Value Date   ALT 101 (H) 11/15/2022   AST 55 (H) 11/15/2022   ALKPHOS 83 11/15/2022   BILITOT 0.8 11/15/2022   Lab Results  Component Value Date   IRON 55 11/13/2022   TIBC 200 (L) 11/13/2022   FERRITIN 357 (H) 11/13/2022   Lab Results  Component Value Date   FOLATE 11.5 11/13/2022   Lab Results  Component Value Date   VITAMINB12 431 11/13/2022   Lab Results  Component Value Date   INR 1.1 11/12/2022   INR 1.4 (H) 10/28/2022    Imaging Studies   MR BRAIN WO CONTRAST  Result Date: 11/01/2022 CLINICAL DATA:  Syncope/presyncope, cerebrovascular cause suspected EXAM: MRI HEAD WITHOUT CONTRAST TECHNIQUE: Multiplanar, multiecho pulse sequences of the brain and surrounding structures were obtained without intravenous contrast. COMPARISON:  MRI head Oct 01, 2017 without report. FINDINGS: Brain: No acute infarction, hemorrhage, hydrocephalus, extra-axial collection or mass lesion. Vascular: Major arterial flow voids are  maintained. Skull and upper cervical spine: Normal marrow signal. Sinuses/Orbits: Clear sinuses.  No acute findings. Other: No mastoid effusions. IMPRESSION: No evidence of acute intracranial abnormality. Electronically Signed   By: Feliberto Harts M.D.   On: 11/01/2022 16:06   NM Hepato W/EF  Result Date: 10/30/2022 CLINICAL DATA:  RIGHT upper quadrant pain. Concern for acalculous cholecystitis. EXAM: NUCLEAR MEDICINE HEPATOBILIARY IMAGING WITH GALLBLADDER EF TECHNIQUE: Sequential images of the abdomen were obtained out to 60 minutes following intravenous administration of radiopharmaceutical. After oral ingestion of Ensure, gallbladder ejection fraction was determined. At 60 min, normal ejection fraction is greater than 33%. RADIOPHARMACEUTICALS:  5.4 mCi Tc-52m  Choletec IV COMPARISON:  None Available. FINDINGS: Prompt clearance radiotracer from blood pool and homogeneous uptake in liver. Gallbladder not visualized at 60 minutes. Gallbladder begins to fill at 75 minutes. Administration of fatty meal demonstrates poor contraction of the gallbladder. Calculated gallbladder ejection fraction is 11%. (Normal gallbladder ejection fraction with Ensure is greater than 33%.) IMPRESSION: 1. Patent cystic duct and common bile duct. 2. Delayed filling of the gallbladder could indicate chronic cholecystitis. 3. Low gallbladder ejection fraction (11%). Electronically Signed   By: Genevive Bi M.D.   On: 10/30/2022 14:33   ECHOCARDIOGRAM COMPLETE  Result Date: 10/28/2022    ECHOCARDIOGRAM REPORT   Patient Name:   Brandon [name] Date of Exam: 10/28/2022 Medical Rec #:  [id number]           Height:       76.0 in Accession #:    [id number]          Weight:       196.9 lb Date of Birth:  Apr 09, 1951           BSA:          2.200 m Patient Age:    71 years            BP:           104/63 mmHg Patient Gender: M                   HR:  68 bpm. Exam Location:  Inpatient Procedure: 2D Echo, Cardiac Doppler and  Color Doppler Indications:    Syncope  History:        Patient has no prior history of Echocardiogram examinations.                 Signs/Symptoms:Syncope; Risk Factors:Hypertension.  Sonographer:    Darlys Gales Referring Phys: [id number] TIMOTHY S OPYD IMPRESSIONS  1. Left ventricular ejection fraction, by estimation, is 65 to 70%. The left ventricle has normal function. The left ventricle has no regional wall motion abnormalities. There is mild concentric left ventricular hypertrophy. Left ventricular diastolic parameters are consistent with Grade I diastolic dysfunction (impaired relaxation).  2. Right ventricular systolic function is normal. The right ventricular size is normal.  3. The mitral valve is normal in structure. Trivial mitral valve regurgitation. No evidence of mitral stenosis.  4. The aortic valve is normal in structure. Aortic valve regurgitation is not visualized. No aortic stenosis is present.  5. The inferior vena cava is normal in size with greater than 50% respiratory variability, suggesting right atrial pressure of 3 mmHg.  6. Technically limited study due to poor sound wave transmission. FINDINGS  Left Ventricle: Left ventricular ejection fraction, by estimation, is 65 to 70%. The left ventricle has normal function. The left ventricle has no regional wall motion abnormalities. The left ventricular internal cavity size was normal in size. There is  mild concentric left ventricular hypertrophy. Left ventricular diastolic parameters are consistent with Grade I diastolic dysfunction (impaired relaxation). Right Ventricle: The right ventricular size is normal. No increase in right ventricular wall thickness. Right ventricular systolic function is normal. Left Atrium: Left atrial size was normal in size. Right Atrium: Right atrial size was normal in size. Pericardium: There is no evidence of pericardial effusion. Mitral Valve: The mitral valve is normal in structure. Trivial mitral valve  regurgitation. No evidence of mitral valve stenosis. Tricuspid Valve: The tricuspid valve is normal in structure. Tricuspid valve regurgitation is trivial. No evidence of tricuspid stenosis. Aortic Valve: The aortic valve is normal in structure. Aortic valve regurgitation is not visualized. No aortic stenosis is present. Aortic valve mean gradient measures 3.0 mmHg. Aortic valve peak gradient measures 5.7 mmHg. Pulmonic Valve: The pulmonic valve was normal in structure. Pulmonic valve regurgitation is trivial. No evidence of pulmonic stenosis. Aorta: The aortic root is normal in size and structure. Venous: The inferior vena cava is normal in size with greater than 50% respiratory variability, suggesting right atrial pressure of 3 mmHg. IAS/Shunts: No atrial level shunt detected by color flow Doppler.  LEFT VENTRICLE PLAX 2D LVIDd:         3.80 cm Diastology LVIDs:         2.70 cm LV e' medial:    6.53 cm/s LV PW:         1.30 cm LV E/e' medial:  8.6 LV IVS:        1.20 cm LV e' lateral:   10.48 cm/s                        LV E/e' lateral: 5.4  RIGHT VENTRICLE RV S prime:     12.70 cm/s TAPSE (M-mode): 1.6 cm LEFT ATRIUM           Index        RIGHT ATRIUM           Index LA Vol (A4C): 43.4 ml 19.72 ml/m  RA  Area:     11.70 cm                                    RA Volume:   23.90 ml  10.86 ml/m  AORTIC VALVE AV Vmax:           119.00 cm/s AV Vmean:          82.700 cm/s AV VTI:            0.176 m AV Peak Grad:      5.7 mmHg AV Mean Grad:      3.0 mmHg LVOT Vmax:         91.00 cm/s LVOT Vmean:        69.300 cm/s LVOT VTI:          0.182 m LVOT/AV VTI ratio: 1.03  AORTA Ao Root diam: 3.30 cm Ao Asc diam:  3.70 cm MITRAL VALVE               TRICUSPID VALVE MV Area (PHT): 2.13 cm    TR Peak grad:   28.1 mmHg MV Decel Time: 356 msec    TR Vmax:        265.00 cm/s MV E velocity: 56.10 cm/s MV A velocity: 77.60 cm/s  SHUNTS MV E/A ratio:  0.72        Systemic VTI: 0.18 m Arvilla Meres MD Electronically signed by  Arvilla Meres MD Signature Date/Time: 10/28/2022/1:00:12 PM    Final    US Abdomen Limited RUQ (LIVER/GB)  Result Date: 10/28/2022 CLINICAL DATA:  221910 with elevated liver enzymes. 161096 with right upper quadrant pain. EXAM: ULTRASOUND ABDOMEN LIMITED RIGHT UPPER QUADRANT COMPARISON:  CT without contrast earlier today. FINDINGS: Gallbladder: There is intermediate density sludge almost completely filling the gallbladder lumen. No shadowing stones are seen and no free wall thickening. There was no positive sonographic Murphy's sign or pericholecystic fluid. Common bile duct: Diameter: Within normal limits measuring 2.7 mm with no intrahepatic biliary prominence. Liver: No focal lesion identified. There is mild generalized increased parenchymal echogenicity consistent with steatosis. Portal vein is patent on color Doppler imaging with normal direction of blood flow towards the liver. Other: None. IMPRESSION: 1. Intermediate density sludge almost completely filling the gallbladder lumen. No shadowing stones are seen and there is no sonographic evidence of acute cholecystitis. 2. Normal caliber bile ducts. 3. Mild hepatic steatosis. Electronically Signed   By: Almira Bar M.D.   On: 10/28/2022 06:35   CT ABDOMEN PELVIS WO CONTRAST  Result Date: 10/28/2022 CLINICAL DATA:  Acute abdominal pain, found down EXAM: CT ABDOMEN AND PELVIS WITHOUT CONTRAST TECHNIQUE: Multidetector CT imaging of the abdomen and pelvis was performed following the standard protocol without IV contrast. Unenhanced CT was performed per clinician order. Lack of IV contrast limits sensitivity and specificity, especially for evaluation of abdominal/pelvic solid viscera. RADIATION DOSE REDUCTION: This exam was performed according to the departmental dose-optimization program which includes automated exposure control, adjustment of the mA and/or kV according to patient size and/or use of iterative reconstruction technique. COMPARISON:   10/20/2022 FINDINGS: Lower chest: No acute pleural or parenchymal lung disease. Hepatobiliary: High attenuation material within the gallbladder may reflect vicarious excretion of previously administered contrast versus gallbladder sludge. No calcified gallstones or cholecystitis. Unremarkable unenhanced appearance of the liver. Pancreas: Unremarkable unenhanced appearance. Spleen: Unremarkable unenhanced appearance. Adrenals/Urinary Tract: No urinary tract calculi or obstructive uropathy within either kidney.  The adrenals and bladder are unremarkable. Stomach/Bowel: No bowel obstruction or ileus. Normal appendix right lower quadrant. No bowel wall thickening or inflammatory change. Vascular/Lymphatic: Aortic atherosclerosis. No enlarged abdominal or pelvic lymph nodes. Reproductive: Stable enlargement of the prostate. No focal abnormality. Other: No free fluid or free intraperitoneal gas. No abdominal wall hernia. Musculoskeletal: No acute or destructive bony abnormalities. Postsurgical changes lower lumbar spine. Reconstructed images demonstrate no additional findings. IMPRESSION: 1. High attenuation material filling the gallbladder lumen, likely vicarious excretion of previously administered contrast. Gallbladder sludge could also give this appearance. No evidence of cholelithiasis or cholecystitis. 2. Otherwise unremarkable unenhanced CT of the abdomen and pelvis. Electronically Signed   By: Sharlet Salina M.D.   On: 10/28/2022 03:22   CT HEAD WO CONTRAST ( )  Result Date: 10/28/2022 CLINICAL DATA:  Syncope EXAM: CT HEAD WITHOUT CONTRAST TECHNIQUE: Contiguous axial images were obtained from the base of the skull through the vertex without intravenous contrast. RADIATION DOSE REDUCTION: This exam was performed according to the departmental dose-optimization program which includes automated exposure control, adjustment of the mA and/or kV according to patient size and/or use of iterative reconstruction  technique. COMPARISON:  No prior CT head available, correlation is made with MRI head 10/01/2017 FINDINGS: Brain: No evidence of acute infarction, hemorrhage, mass, mass effect, or midline shift. No hydrocephalus or extra-axial fluid collection. Gray-white differentiation is preserved. The basilar cisterns are patent. Normal cerebral volume for age. Vascular: No hyperdense vessel. Skull: Negative for fracture or focal lesion. Sinuses/Orbits: No acute finding. Other: The mastoid air cells are well aerated. IMPRESSION: No acute intracranial process. Electronically Signed   By: Wiliam Ke M.D.   On: 10/28/2022 03:18    Assessment       PLAN   ***   Leanna Battles. Melvyn Neth, MHS, PA-C [facility]

## 2022-12-03 ENCOUNTER — Encounter: Payer: Self-pay | Admitting: Family Medicine

## 2022-12-03 ENCOUNTER — Ambulatory Visit (INDEPENDENT_AMBULATORY_CARE_PROVIDER_SITE_OTHER): Payer: Medicare HMO | Admitting: Family Medicine

## 2022-12-03 VITALS — BP 120/62 | HR 88 | Temp 97.9°F | Ht 76.0 in | Wt 174.6 lb

## 2022-12-03 DIAGNOSIS — K264 Chronic or unspecified duodenal ulcer with hemorrhage: Secondary | ICD-10-CM

## 2022-12-03 LAB — CBC WITH DIFFERENTIAL/PLATELET
Absolute Monocytes: 722 cells/uL (ref 200–950)
Basophils Absolute: 42 cells/uL (ref 0–200)
Basophils Relative: 0.5 %
Eosinophils Absolute: 141 cells/uL (ref 15–500)
Eosinophils Relative: 1.7 %
Lymphs Abs: 3544 cells/uL (ref 850–3900)
MCHC: 32.4 g/dL (ref 32.0–36.0)
Neutro Abs: 3851 cells/uL (ref 1500–7800)
Neutrophils Relative %: 46.4 %
Platelets: 374 10*3/uL (ref 140–400)
RDW: 13.3 % (ref 11.0–15.0)
Total Lymphocyte: 42.7 %

## 2022-12-03 NOTE — Progress Notes (Deleted)
Referring Provider: Donita Brooks, MD Primary Care Physician:  Donita Brooks, MD Primary Gastroenterologist:  Dr. Karilyn Cota previously   No chief complaint on file.   HPI:   Brandon [name] is a 72 y.o. male presenting today at the request of Donita Brooks, MD for melena, IDA.   Patient was admitted to [facility] on 11/12/2022 with persistent black tarry stools, decline in his hemoglobin, associated weakness.  Hemoglobin was 10.8 on admission.  FOBT positive.  EGD 6/19 with normal esophagus, gastritis biopsied, nonbleeding duodenal ulcers suspected to be NSAID mediated, mucosal changes in the duodenum.  Pathology was positive for H. pylori which resulted after discharge.  Does not appear that this was never addressed. ***Hemoglobin declined as low as 8.4 on 6/21, stable at 8.6 on 6/22.   Hemoglobin improved to 10.8 on 12/03/2022.  Today:  IDA/PUD/H pylori:   Elevated LFTs: Acutely elevated 10/20/2022. Previously normal.  CT A/P with contrast 10/20/2022 with no focal liver abnormality.  Gallbladder moderately distended without gallstones or gallbladder wall thickening or biliary dilation.  RUQ ultrasound 11/24/2022 with mild hepatic steatosis.   Past Medical History:  Diagnosis Date   Hypertension    Syphilis     Past Surgical History:  Procedure Laterality Date   BACK SURGERY     4 yrs ago   BIOPSY  11/13/2022   Procedure: BIOPSY;  Surgeon: Willis Modena, MD;  Location: [facility];  Service: Gastroenterology;;   COLONOSCOPY  01/22/2012   Procedure: COLONOSCOPY;  Surgeon: Malissa Hippo, MD;  [facility];  Service: Endoscopy;  Laterality: N/A;  200   ESOPHAGOGASTRODUODENOSCOPY (EGD) WITH PROPOFOL Left 11/13/2022   Procedure: ESOPHAGOGASTRODUODENOSCOPY (EGD) WITH PROPOFOL;  Surgeon: Willis Modena, MD;  Location: [facility];  Service: Gastroenterology;  Laterality: Left;    Current Outpatient Medications  Medication Sig Dispense Refill   feeding  supplement (ENSURE ENLIVE / ENSURE PLUS) LIQD Take 237 mLs by mouth 2 (two) times daily between meals. (Patient not taking: Reported on 12/03/2022) 237 mL 12   pantoprazole (PROTONIX) 40 MG tablet Take 1 tablet (40 mg total) by mouth 2 (two) times daily before a meal. 60 tablet 2   polyethylene glycol (MIRALAX / GLYCOLAX) 17 g packet Take 17 g by mouth daily as needed for mild constipation. (Patient not taking: Reported on 12/03/2022) 14 each 0   No current facility-administered medications for this visit.    Allergies as of 12/05/2022   (No Known Allergies)    Family History  Problem Relation Age of Onset   Diabetes Mother    Hypertension Mother    Breast cancer Mother    Alcohol abuse Father    Aneurysm Father    Breast cancer Daughter    Colon cancer Neg Hx     Social History   Socioeconomic History   Marital status: Legally Separated    Spouse name: Not on file   Number of children: 3   Years of education: 12   Highest education level: High school graduate  Occupational History   Occupation: Retired- IT trainer  Tobacco Use   Smoking status: Every Day    Packs/day: 0.50    Years: 45.00    Additional pack years: 0.00    Total pack years: 22.50    Types: Cigarettes   Smokeless tobacco: Never  Vaping Use   Vaping Use: Never used  Substance and Sexual Activity   Alcohol use: No   Drug use: No   Sexual activity: Yes  Birth control/protection: None  Other Topics Concern   Not on file  Social History Narrative   Grew up in [place], [place]. 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    Married for 17 years. Has 3 children, 11 grandchildren, 4 great grandchildren.   Wife had breast cancer and she died eight years ago. Remarried, wife is younger.    Wears seatbelt.   Eats all food groups.    Drinks 3-4 sodas per day.   Right-handed.   Social Determinants of Health   Financial Resource Strain: Low Risk  (04/08/2018)   Overall Financial Resource Strain (CARDIA)     Difficulty of Paying Living Expenses: Not hard at all  Food Insecurity: No Food Insecurity (11/19/2022)   Hunger Vital Sign    Worried About Running Out of Food in the Last Year: Never true    Ran Out of Food in the Last Year: Never true  Transportation Needs: No Transportation Needs (11/19/2022)   PRAPARE - Administrator, Civil Service (Medical): No    Lack of Transportation (Non-Medical): No  Physical Activity: Insufficiently Active (11/23/2021)   Exercise Vital Sign    Days of Exercise per Week: 2 days    Minutes of Exercise per Session: 60 min  Stress: No Stress Concern Present (11/23/2021)   [employer] of Occupational Health - Occupational Stress Questionnaire    Feeling of Stress : Not at all  Social Connections: Socially Integrated (11/23/2021)   Social Connection and Isolation Panel [NHANES]    Frequency of Communication with Friends and Family: More than three times a week    Frequency of Social Gatherings with Friends and Family: More than three times a week    Attends Religious Services: More than 4 times per year    Active Member of [employer] or Organizations: Yes    Attends Engineer, structural: More than 4 times per year    Marital Status: Married  Catering manager Violence: Not At Risk (11/14/2022)   Humiliation, Afraid, Rape, and Kick questionnaire    Fear of Current or Ex-Partner: No    Emotionally Abused: No    Physically Abused: No    Sexually Abused: No    Review of Systems: Gen: Denies any fever, chills, fatigue, weight loss, lack of appetite.  CV: Denies chest pain, heart palpitations, peripheral edema, syncope.  Resp: Denies shortness of breath at rest or with exertion. Denies wheezing or cough.  GI: Denies dysphagia or odynophagia. Denies jaundice, hematemesis, fecal incontinence. GU : Denies urinary burning, urinary frequency, urinary hesitancy MS: Denies joint pain, muscle weakness, cramps, or limitation of movement.  Derm: Denies  rash, itching, dry skin Psych: Denies depression, anxiety, memory loss, and confusion Heme: Denies bruising, bleeding, and enlarged lymph nodes.  Physical Exam: There were no vitals taken for this visit. General:   Alert and oriented. Pleasant and cooperative. Well-nourished and well-developed.  Head:  Normocephalic and atraumatic. Eyes:  Without icterus, sclera clear and conjunctiva pink.  Ears:  Normal auditory acuity. Lungs:  Clear to auscultation bilaterally. No wheezes, rales, or rhonchi. No distress.  Heart:  S1, S2 present without murmurs appreciated.  Abdomen:  +BS, soft, non-tender and non-distended. No HSM noted. No guarding or rebound. No masses appreciated.  Rectal:  Deferred  Msk:  Symmetrical without gross deformities. Normal posture. Extremities:  Without edema. Neurologic:  Alert and  oriented x4;  grossly normal neurologically. Skin:  Intact without significant lesions or rashes. Psych:  Alert  and cooperative. Normal mood and affect.    Assessment:     Plan:  ***   Ermalinda Memos, PA-C [facility] Gastroenterology 12/05/2022

## 2022-12-03 NOTE — Progress Notes (Signed)
Subjective:    Patient ID: Brandon [name], male    DOB: 1951-03-25, 72 y.o.   MRN: [id number] Admit date: 11/12/2022 Discharge date: 11/16/2022   Admitted From: Home Disposition: Home   Recommendations for Outpatient Follow-up:  Follow up with PCP in 1-2 weeks Outpatient follow-up with [facility] gastroenterology, Dr. Delton Coombes on Protonix 40 mg p.o. twice daily for duodenal ulcers Discontinued losartan Continue encourage increased hydration due to orthostatic hypotension noted on admission Encourage avoidance of NSAIDs Please obtain BMP/CBC in one week   Home Health: No needs identified Equipment/Devices: None   Discharge Condition: Stable CODE STATUS: Full code Diet recommendation: Regular diet   History of present illness:   Brandon [name] is a 72 y.o. male with past medical history significant for essential hypertension who presented to [facility] ED on 11/12/2022 with persistent dark tarry stools.  Recently hospitalized for syncope, acute renal failure.  Had reported colonoscopy in 2013; but no report available for review.  Denies any antiplatelet/anticoagulant use.  Patient has been reported with fatigue, low blood pressures.  In follow-up, patient was seen by PCP with fingerstick hemoglobin noted to be 9.9.  Patient's home losartan was discontinued and was started on ferrous sulfate twice daily.  The morning thereafter, patient had a large dark BM and was extremely weak and family brought him to the ED for further evaluation management.  Also reports that he had been lying on the bathroom floor for "2 days" and was found by his sibling.   In the ED, temperature 98.0 F, HR 89, RR 18, BP 06/23/1983, SpO2 100% on room air.  WBC 7.2, hemoglobin 10.8, platelets 559.  Sodium 134, potassium 3.7, chloride 104, CO2 19, glucose 122, BUN 25, creatinine 1.07.  AST 53, ALT 116, total bilirubin 0.5.  FOBT positive.  Patient was started on Protonix drip.  EDP discussed with  GI, Dr. Levora Angel who will see in consult and requested hospitalist admission.  TRH consulted for admission for further evaluation management for concern of upper GI bleed.   Hospital course:   Upper GI bleed secondary to duodenal ulcers with H. pylori. Patient presenting to ED with dark tarry stools for several weeks, no history of NSAID abuse, no use of antiplatelet/anticoagulants.  Hemoglobin 10.8 on admission.  FOBT positive.  Anemia panel with iron 55, TIBC low at 200, ferritin 357, folate 11.5, vitamin B12 431.  GI was consulted and patient underwent EGD with findings of patchy inflammation within the stomach s/p biopsy, noted to nonbleeding cratered duodenal ulcers.  Biopsies taken with no malignancy identified but positive for H. pylori.  Will continue Protonix for milligrams p.o. twice daily on discharge.  Will start H. pylori treatment with clarithromycin twice daily and metronidazole 3 times daily x 14 days and continues on PPI as above.  Outpatient follow-up with GI.   Orthostatic hypotension Patient notably orthostatic during hospitalization likely secondary to blood loss from GI bleed as above in addition to dehydration.  Supported with aggressive IV fluid resuscitation with improvement and resolution of symptoms.  Encourage patient to maintain adequate hydration.  Discontinued home antihypertensives.  Recommend close outpatient follow-up with PCP.     Essential hypertension Home losartan recently discontinued by PCP.  Has had recent orthostatic hypotension leading to likely syncopal event from recent hospitalization.   Weakness/debility/deconditioning: Resume home PT/OT/aide through [facility]    Discharge Diagnoses:  Principal Problem:   Upper GI bleed Active Problems:   UGIB (upper gastrointestinal bleed)  12/03/22 Is  here today for follow-up.  He is feeling much better.  He is much stronger.  He is now walking without assistance.  His energy level has improved dramatically.  He is  almost finished antibiotics for H. pylori.  He has 4 additional days.  He is on twice daily proton pump inhibitor.  He states that his melena has resolved.  He is now having normal stools.  He denies any reflux or chest pain.  He denies any cough or shortness of breath or pleurisy.  He denies any dysuria.  He denies any leg swelling.  He has negative Homans' sign.  His blood pressure remains normal despite being off his antihypertensives.  Past Medical History:  Diagnosis Date   Hypertension    Syphilis    Past Surgical History:  Procedure Laterality Date   BACK SURGERY     4 yrs ago   BIOPSY  11/13/2022   Procedure: BIOPSY;  Surgeon: Willis Modena, MD;  Location: [facility];  Service: Gastroenterology;;   COLONOSCOPY  01/22/2012   Procedure: COLONOSCOPY;  Surgeon: Malissa Hippo, MD;  [facility];  Service: Endoscopy;  Laterality: N/A;  200   ESOPHAGOGASTRODUODENOSCOPY (EGD) WITH PROPOFOL Left 11/13/2022   Procedure: ESOPHAGOGASTRODUODENOSCOPY (EGD) WITH PROPOFOL;  Surgeon: Willis Modena, MD;  Location: [facility];  Service: Gastroenterology;  Laterality: Left;   Current Outpatient Medications on File Prior to Visit  Medication Sig Dispense Refill   pantoprazole (PROTONIX) 40 MG tablet Take 1 tablet (40 mg total) by mouth 2 (two) times daily before a meal. 60 tablet 2   feeding supplement (ENSURE ENLIVE / ENSURE PLUS) LIQD Take 237 mLs by mouth 2 (two) times daily between meals. (Patient not taking: Reported on 12/03/2022) 237 mL 12   polyethylene glycol (MIRALAX / GLYCOLAX) 17 g packet Take 17 g by mouth daily as needed for mild constipation. (Patient not taking: Reported on 12/03/2022) 14 each 0   No current facility-administered medications on file prior to visit.   No Known Allergies Social History   Socioeconomic History   Marital status: Legally Separated    Spouse name: Not on file   Number of children: 3   Years of education: 12   Highest education level: High  school graduate  Occupational History   Occupation: Retired- IT trainer  Tobacco Use   Smoking status: Every Day    Packs/day: 0.50    Years: 45.00    Additional pack years: 0.00    Total pack years: 22.50    Types: Cigarettes   Smokeless tobacco: Never  Vaping Use   Vaping Use: Never used  Substance and Sexual Activity   Alcohol use: No   Drug use: No   Sexual activity: Yes    Birth control/protection: None  Other Topics Concern   Not on file  Social History Narrative   Grew up in [place], [place]. 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    Married for 17 years. Has 3 children, 11 grandchildren, 4 great grandchildren.   Wife had breast cancer and she died eight years ago. Remarried, wife is younger.    Wears seatbelt.   Eats all food groups.    Drinks 3-4 sodas per day.   Right-handed.   Social Determinants of Health   Financial Resource Strain: Low Risk  (04/08/2018)   Overall Financial Resource Strain (CARDIA)    Difficulty of Paying Living Expenses: Not hard at all  Food Insecurity: No Food Insecurity (11/19/2022)   Hunger  Vital Sign    Worried About Programme researcher, broadcasting/film/video in the Last Year: Never true    Ran Out of Food in the Last Year: Never true  Transportation Needs: No Transportation Needs (11/19/2022)   PRAPARE - Administrator, Civil Service (Medical): No    Lack of Transportation (Non-Medical): No  Physical Activity: Insufficiently Active (11/23/2021)   Exercise Vital Sign    Days of Exercise per Week: 2 days    Minutes of Exercise per Session: 60 min  Stress: No Stress Concern Present (11/23/2021)   [employer] of Occupational Health - Occupational Stress Questionnaire    Feeling of Stress : Not at all  Social Connections: Socially Integrated (11/23/2021)   Social Connection and Isolation Panel [NHANES]    Frequency of Communication with Friends and Family: More than three times a week    Frequency of Social Gatherings with Friends and Family:  More than three times a week    Attends Religious Services: More than 4 times per year    Active Member of [employer] or Organizations: Yes    Attends Banker Meetings: More than 4 times per year    Marital Status: Married  Catering manager Violence: Not At Risk (11/14/2022)   Humiliation, Afraid, Rape, and Kick questionnaire    Fear of Current or Ex-Partner: No    Emotionally Abused: No    Physically Abused: No    Sexually Abused: No      Review of Systems  Gastrointestinal:  Positive for diarrhea.  All other systems reviewed and are negative.      Objective:   Physical Exam Vitals reviewed.  Constitutional:      General: He is not in acute distress.    Appearance: Normal appearance. He is normal weight. He is not ill-appearing, toxic-appearing or diaphoretic.  HENT:     Head: Normocephalic and atraumatic.  Cardiovascular:     Rate and Rhythm: Normal rate and regular rhythm.     Pulses: Normal pulses.     Heart sounds: Normal heart sounds. No murmur heard.    No friction rub. No gallop.  Pulmonary:     Effort: Pulmonary effort is normal. No respiratory distress.     Breath sounds: Normal breath sounds. No stridor. No wheezing or rhonchi.  Abdominal:     General: There is no distension.     Palpations: Abdomen is soft. There is no mass.     Tenderness: There is no abdominal tenderness. There is no guarding or rebound.     Hernia: No hernia is present.  Genitourinary:    Prostate: Normal.     Rectum: Normal.  Musculoskeletal:     Right lower leg: No edema.     Left lower leg: No edema.  Skin:    Coloration: Skin is not jaundiced or pale.     Findings: No bruising, erythema, lesion or rash.  Neurological:     General: No focal deficit present.     Mental Status: He is alert and oriented to person, place, and time.     Cranial Nerves: No cranial nerve deficit.     Sensory: No sensory deficit.     Motor: No weakness.     Coordination: Coordination normal.      Gait: Gait normal.     Deep Tendon Reflexes: Reflexes normal.           Assessment & Plan:  Gastrointestinal hemorrhage associated with duodenal ulcer - Plan: CBC with Differential/Platelet,  COMPLETE METABOLIC PANEL WITH GFR Patient appears much better than his last visit.  I will check a CBC today.  However the patient denies any symptoms of active GI bleeding.  He has an appointment to see gastroenterology next week to discuss a colonoscopy.  I encouraged him to keep that appointment.  Also encouraged him to complete therapy for H. pylori.  I would like the patient to stay on Protonix once daily indefinitely due to his history of GI bleed.  Await the results of his lab work

## 2022-12-04 LAB — COMPLETE METABOLIC PANEL WITH GFR
AG Ratio: 1.2 (calc) (ref 1.0–2.5)
ALT: 32 U/L (ref 9–46)
AST: 24 U/L (ref 10–35)
Albumin: 4 g/dL (ref 3.6–5.1)
Alkaline phosphatase (APISO): 129 U/L (ref 35–144)
BUN: 16 mg/dL (ref 7–25)
CO2: 29 mmol/L (ref 20–32)
Calcium: 10.9 mg/dL — ABNORMAL HIGH (ref 8.6–10.3)
Chloride: 100 mmol/L (ref 98–110)
Creat: 0.88 mg/dL (ref 0.70–1.35)
Globulin: 3.3 g/dL (calc) (ref 1.9–3.7)
Glucose, Bld: 100 mg/dL — ABNORMAL HIGH (ref 65–99)
Potassium: 5 mmol/L (ref 3.5–5.3)
Sodium: 140 mmol/L (ref 135–146)
Total Bilirubin: 0.3 mg/dL (ref 0.2–1.2)
Total Protein: 7.3 g/dL (ref 6.1–8.1)
eGFR: 93 mL/min/{1.73_m2} (ref >=60)

## 2022-12-04 LAB — CBC WITH DIFFERENTIAL/PLATELET
HCT: 33.3 % — ABNORMAL LOW (ref 38.5–50.0)
Hemoglobin: 10.8 g/dL — ABNORMAL LOW (ref 13.2–17.1)
MCH: 29.3 pg (ref 27.0–33.0)
MCV: 90.2 fL (ref 80.0–100.0)
MPV: 10.4 fL (ref 7.5–12.5)
Monocytes Relative: 8.7 %
RBC: 3.69 10*6/uL — ABNORMAL LOW (ref 4.20–5.80)
WBC: 8.3 10*3/uL (ref 3.8–10.8)

## 2022-12-05 ENCOUNTER — Ambulatory Visit: Payer: Medicare HMO | Admitting: Gastroenterology

## 2022-12-12 ENCOUNTER — Ambulatory Visit (INDEPENDENT_AMBULATORY_CARE_PROVIDER_SITE_OTHER): Payer: Medicare HMO

## 2022-12-12 VITALS — Ht 76.0 in | Wt 174.0 lb

## 2022-12-12 DIAGNOSIS — Z0000 Encounter for general adult medical examination without abnormal findings: Secondary | ICD-10-CM | POA: Diagnosis not present

## 2022-12-12 NOTE — Progress Notes (Signed)
Subjective:   Brandon [name] is a 72 y.o. male who presents for Medicare Annual/Subsequent preventive examination.  Visit Complete: Virtual  I connected with  Brandon [name] on 12/12/22 by a audio enabled telemedicine application and verified that I am speaking with the correct person using two identifiers.  Patient Location: Home  Provider Location: Home Office  I discussed the limitations of evaluation and management by telemedicine. The patient expressed understanding and agreed to proceed.  Review of Systems     Cardiac Risk Factors include: advanced age (>73men, >19 women);dyslipidemia;hypertension;male gender;smoking/ tobacco exposure  Per patient no change in vitals since last visit, unable to obtain new vitals due to telehealth visit     Objective:    Today's Vitals   12/12/22 1152  Weight: 174 lb (78.9 kg)  Height: 6\' 4"  (1.93 m)   Body mass index is 21.18 kg/m.     12/12/2022   11:54 AM 11/13/2022    1:16 PM 11/12/2022    3:57 PM 10/27/2022   10:33 PM 10/20/2022   12:12 AM 11/23/2021    2:53 PM 01/22/2012    1:05 PM  Advanced Directives  Does Patient Have a Medical Advance Directive? No No No No No Yes Patient has advance directive, copy not in chart  Type of Advance Directive      Healthcare Power of [place];Living will Living will;Healthcare Power of Attorney  Copy of Healthcare Power of Attorney in Chart?      No - copy requested   Would patient like information on creating a medical advance directive? Yes (MAU/Ambulatory/Procedural Areas - Information given)  No - Patient declined No - Patient declined No - Patient declined    Pre-existing out of facility DNR order (yellow form or pink MOST form)       No    Current Medications (verified) Outpatient Encounter Medications as of 12/12/2022  Medication Sig   feeding supplement (ENSURE ENLIVE / ENSURE PLUS) LIQD Take 237 mLs by mouth 2 (two) times daily between meals. (Patient not taking: Reported on  12/03/2022)   pantoprazole (PROTONIX) 40 MG tablet Take 1 tablet (40 mg total) by mouth 2 (two) times daily before a meal. (Patient not taking: Reported on 12/12/2022)   polyethylene glycol (MIRALAX / GLYCOLAX) 17 g packet Take 17 g by mouth daily as needed for mild constipation. (Patient not taking: Reported on 12/03/2022)   No facility-administered encounter medications on file as of 12/12/2022.    Allergies (verified) Patient has no known allergies.   History: Past Medical History:  Diagnosis Date   Hypertension    Syphilis    Past Surgical History:  Procedure Laterality Date   BACK SURGERY     4 yrs ago   BIOPSY  11/13/2022   Procedure: BIOPSY;  Surgeon: Willis Modena, MD;  Location: [facility];  Service: Gastroenterology;;   COLONOSCOPY  01/22/2012   Procedure: COLONOSCOPY;  Surgeon: Malissa Hippo, MD;  Location: [facility];  Service: Endoscopy;  Laterality: N/A;  200   ESOPHAGOGASTRODUODENOSCOPY (EGD) WITH PROPOFOL Left 11/13/2022   Procedure: ESOPHAGOGASTRODUODENOSCOPY (EGD) WITH PROPOFOL;  Surgeon: Willis Modena, MD;  Location: [facility];  Service: Gastroenterology;  Laterality: Left;   Family History  Problem Relation Age of Onset   Diabetes Mother    Hypertension Mother    Breast cancer Mother    Alcohol abuse Father    Aneurysm Father    Breast cancer Daughter    Colon cancer Neg Hx    Social  History   Socioeconomic History   Marital status: Legally Separated    Spouse name: Not on file   Number of children: 3   Years of education: 76   Highest education level: High school graduate  Occupational History   Occupation: Retired- IT trainer  Tobacco Use   Smoking status: Every Day    Current packs/day: 0.50    Average packs/day: 0.5 packs/day for 45.0 years (22.5 ttl pk-yrs)    Types: Cigarettes   Smokeless tobacco: Never  Vaping Use   Vaping status: Never Used  Substance and Sexual Activity   Alcohol use: No   Drug use: No   Sexual activity: Yes     Birth control/protection: None  Other Topics Concern   Not on file  Social History Narrative   Grew up in [place], [place]. 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    Married for 17 years. Has 3 children, 11 grandchildren, 4 great grandchildren.   Wife had breast cancer and she died eight years ago. Remarried, wife is younger.    Wears seatbelt.   Eats all food groups.    Drinks 3-4 sodas per day.   Right-handed.   Social Determinants of Health   Financial Resource Strain: Low Risk  (12/12/2022)   Overall Financial Resource Strain (CARDIA)    Difficulty of Paying Living Expenses: Not hard at all  Food Insecurity: No Food Insecurity (12/12/2022)   Hunger Vital Sign    Worried About Running Out of Food in the Last Year: Never true    Ran Out of Food in the Last Year: Never true  Transportation Needs: No Transportation Needs (12/12/2022)   PRAPARE - Administrator, Civil Service (Medical): No    Lack of Transportation (Non-Medical): No  Physical Activity: Insufficiently Active (12/12/2022)   Exercise Vital Sign    Days of Exercise per Week: 3 days    Minutes of Exercise per Session: 30 min  Stress: No Stress Concern Present (12/12/2022)   [employer] of Occupational Health - Occupational Stress Questionnaire    Feeling of Stress : Not at all  Social Connections: Moderately Isolated (12/12/2022)   Social Connection and Isolation Panel [NHANES]    Frequency of Communication with Friends and Family: More than three times a week    Frequency of Social Gatherings with Friends and Family: Three times a week    Attends Religious Services: More than 4 times per year    Active Member of Clubs or Organizations: No    Attends Banker Meetings: Never    Marital Status: Divorced    Tobacco Counseling Ready to quit: Not Answered Counseling given: Not Answered   Clinical Intake:  Pre-visit preparation completed: Yes  Pain : No/denies pain     Diabetes:  No  How often do you need to have someone help you when you read instructions, pamphlets, or other written materials from your doctor or pharmacy?: 1 - Never  Interpreter Needed?: No  Information entered by :: Kandis Fantasia LPN   Activities of Daily Living    12/12/2022   11:52 AM 11/13/2022   12:00 PM  In your present state of health, do you have any difficulty performing the following activities:  Hearing? 0   Vision? 0   Difficulty concentrating or making decisions? 0   Walking or climbing stairs? 0   Dressing or bathing? 0   Doing errands, shopping? 0 0  Preparing Food and eating ? N  Using the Toilet? N   In the past six months, have you accidently leaked urine? N   Do you have problems with loss of bowel control? N   Managing your Medications? N   Managing your Finances? N   Housekeeping or managing your Housekeeping? N     Patient Care Team: Donita Brooks, MD as PCP - General (Family Medicine) Rennis Chris, MD as Consulting Physician (Ophthalmology)  Indicate any recent Medical Services you may have received from other than [facility] providers in the past year (date may be approximate).     Assessment:   This is a routine wellness examination for [place].  Hearing/Vision screen Hearing Screening - Comments:: Denies hearing difficulties   Vision Screening - Comments::  up to date with routine eye exams with Dr. Genia Del   Dietary issues and exercise activities discussed:     Goals Addressed             This Visit's Progress    Remain active and independent        Depression Screen    12/12/2022   11:54 AM 12/03/2022   12:12 PM 11/23/2021    2:52 PM 04/08/2018    2:51 PM 01/27/2018    1:38 PM 01/13/2018    1:13 PM 09/05/2017   11:32 AM  PHQ 2/9 Scores  PHQ - 2 Score 0 0 0 0 0 3 0  PHQ- 9 Score     2 6 6     Fall Risk    12/12/2022   11:55 AM 12/03/2022   12:12 PM 11/23/2021    2:53 PM 04/08/2018    2:51 PM 01/27/2018    1:38 PM  Fall Risk   Falls  in the past year? 0 0 0 0 No  Number falls in past yr: 0 0 0    Injury with Fall? 0 0 0    Risk for fall due to : No Fall Risks No Fall Risks No Fall Risks    Follow up Falls prevention discussed;Education provided;Falls evaluation completed Falls prevention discussed Falls prevention discussed Falls evaluation completed     MEDICARE RISK AT HOME:  Medicare Risk at Home - 12/12/22 1155     Any stairs in or around the home? No    If so, are there any without handrails? No    Home free of loose throw rugs in walkways, pet beds, electrical cords, etc? Yes    Adequate lighting in your home to reduce risk of falls? Yes    Life alert? No    Use of a cane, walker or w/c? No    Grab bars in the bathroom? Yes    Shower chair or bench in shower? No    Elevated toilet seat or a handicapped toilet? No             TIMED UP AND GO:  Was the test performed?  No    Cognitive Function:        12/12/2022   11:55 AM 11/23/2021    2:54 PM  6CIT Screen  What Year? 0 points 0 points  What month? 0 points 0 points  What time? 0 points 0 points  Count back from 20 0 points 0 points  Months in reverse 2 points 2 points  Repeat phrase 0 points 0 points  Total Score 2 points 2 points    Immunizations Immunization History  Administered Date(s) Administered   Influenza,inj,Quad PF,6+ Mos 01/13/2018   PFIZER(Purple Top)SARS-COV-2 Vaccination 08/12/2019,  09/07/2019   Tdap 09/05/2017    TDAP status: Up to date  Pneumococcal vaccine status: Due, Education has been provided regarding the importance of this vaccine. Advised may receive this vaccine at local pharmacy or Health Dept. Aware to provide a copy of the vaccination record if obtained from local pharmacy or Health Dept. Verbalized acceptance and understanding.  Covid-19 vaccine status: Information provided on how to obtain vaccines.   Qualifies for Shingles Vaccine? Yes   Zostavax completed No   Shingrix Completed?: No.    Education  has been provided regarding the importance of this vaccine. Patient has been advised to call insurance company to determine out of pocket expense if they have not yet received this vaccine. Advised may also receive vaccine at local pharmacy or Health Dept. Verbalized acceptance and understanding.  Screening Tests Health Maintenance  Topic Date Due   Lung Cancer Screening  Never done   COVID-19 Vaccine (3 - 2023-24 season) 12/19/2022 (Originally 01/25/2022)   Zoster Vaccines- Shingrix (1 of 2) 03/05/2023 (Originally 02/17/2003)   Pneumonia Vaccine 59+ Years old (1 of 2 - PCV) 12/03/2023 (Originally 02/17/1957)   Colonoscopy  12/03/2023 (Originally 01/21/2022)   INFLUENZA VACCINE  12/26/2022   Medicare Annual Wellness (AWV)  12/12/2023   DTaP/Tdap/Td (2 - Td or Tdap) 09/06/2027   Hepatitis C Screening  Completed   HPV VACCINES  Aged Out    Health Maintenance  Health Maintenance Due  Topic Date Due   Lung Cancer Screening  Never done    Colorectal cancer screening: Type of screening: Colonoscopy. Completed 01/22/12. Repeat every 10 years (Patient would like to postpone repeat until after he completes upcoming dental work)  Lung Cancer Screening: (Low Dose CT Chest recommended if Age 37-80 years, 20 pack-year currently smoking OR have quit w/in 15years.) does qualify.   Lung Cancer Screening Referral: scheduled for 12/18/22  Additional Screening:  Hepatitis C Screening: does qualify; Completed 08/14/17  Vision Screening: Recommended annual ophthalmology exams for early detection of glaucoma and other disorders of the eye. Is the patient up to date with their annual eye exam?  Yes  Who is the provider or what is the name of the office in which the patient attends annual eye exams? Dr. Genia Del If pt is not established with a provider, would they like to be referred to a provider to establish care? No .   Dental Screening: Recommended annual dental exams for proper oral hygiene  Community  Resource Referral / Chronic Care Management: CRR required this visit?  No   CCM required this visit?  No     Plan:     I have personally reviewed and noted the following in the patient's chart:   Medical and social history Use of alcohol, tobacco or illicit drugs  Current medications and supplements including opioid prescriptions. Patient is not currently taking opioid prescriptions. Functional ability and status Nutritional status Physical activity Advanced directives List of other physicians Hospitalizations, surgeries, and ER visits in previous 12 months Vitals Screenings to include cognitive, depression, and falls Referrals and appointments  In addition, I have reviewed and discussed with patient certain preventive protocols, quality metrics, and best practice recommendations. A written personalized care plan for preventive services as well as general preventive health recommendations were provided to patient.     Kandis Fantasia [place], [place]   08[birth year]01/[birth year]76   After Visit Summary: (Mail) Due to this being a telephonic visit, the after visit summary with patients personalized plan was offered to patient via  mail   Nurse Notes: No concerns

## 2022-12-12 NOTE — Patient Instructions (Signed)
Brandon [name] , Thank you for taking time to come for your Medicare Wellness Visit. I appreciate your ongoing commitment to your health goals. Please review the following plan we discussed and let me know if I can assist you in the future.   These are the goals we discussed:  Goals      Exercise 3x per week (30 min per time)     Continue to stay active and healthy.     Remain active and independent        This is a list of the screening recommended for you and due dates:  Health Maintenance  Topic Date Due   Screening for Lung Cancer  Never done   COVID-19 Vaccine (3 - 2023-24 season) 12/19/2022*   Zoster (Shingles) Vaccine (1 of 2) 03/05/2023*   Pneumonia Vaccine (1 of 2 - PCV) 12/03/2023*   Colon Cancer Screening  12/03/2023*   Flu Shot  12/26/2022   Medicare Annual Wellness Visit  12/12/2023   DTaP/Tdap/Td vaccine (2 - Td or Tdap) 09/06/2027   Hepatitis C Screening  Completed   HPV Vaccine  Aged Out  *Topic was postponed. The date shown is not the original due date.    Advanced directives: Information on Advanced Care Planning can be found at [facility] Advance Health Care Directives Advance Health Care Directives ([email or web address] Please bring a copy of your health care power of attorney and living will to the office to be added to your chart at your convenience.  Conditions/risks identified: Aim for 30 minutes of exercise or brisk walking, 6-8 glasses of water, and 5 servings of fruits and vegetables each day.  Next appointment: Follow up in one year for your annual wellness visit.   The number for Dr. Dulce Sellar is [phone] (GI doctor)  Preventive Care 35 Years and Older, Male  Preventive care refers to lifestyle choices and visits with your health care provider that can promote health and wellness. What does preventive care include? A yearly physical exam. This is also called an annual well check. Dental exams once or twice a year. Routine eye exams.  Ask your health care provider how often you should have your eyes checked. Personal lifestyle choices, including: Daily care of your teeth and gums. Regular physical activity. Eating a healthy diet. Avoiding tobacco and drug use. Limiting alcohol use. Practicing safe sex. Taking low doses of aspirin every day. Taking vitamin and mineral supplements as recommended by your health care provider. What happens during an annual well check? The services and screenings done by your health care provider during your annual well check will depend on your age, overall health, lifestyle risk factors, and family history of disease. Counseling  Your health care provider may ask you questions about your: Alcohol use. Tobacco use. Drug use. Emotional well-being. Home and relationship well-being. Sexual activity. Eating habits. History of falls. Memory and ability to understand (cognition). Work and work Astronomer. Screening  You may have the following tests or measurements: Height, weight, and BMI. Blood pressure. Lipid and cholesterol levels. These may be checked every 5 years, or more frequently if you are over 25 years old. Skin check. Lung cancer screening. You may have this screening every year starting at age 67 if you have a 30-pack-year history of smoking and currently smoke or have quit within the past 15 years. Fecal occult blood test (FOBT) of the stool. You may have this test every year starting at age 55. Flexible sigmoidoscopy or colonoscopy. You  may have a sigmoidoscopy every 5 years or a colonoscopy every 10 years starting at age 46. Prostate cancer screening. Recommendations will vary depending on your family history and other risks. Hepatitis C blood test. Hepatitis B blood test. Sexually transmitted disease (STD) testing. Diabetes screening. This is done by checking your blood sugar (glucose) after you have not eaten for a while (fasting). You may have this done every 1-3  years. Abdominal aortic aneurysm (AAA) screening. You may need this if you are a current or former smoker. Osteoporosis. You may be screened starting at age 77 if you are at high risk. Talk with your health care provider about your test results, treatment options, and if necessary, the need for more tests. Vaccines  Your health care provider may recommend certain vaccines, such as: Influenza vaccine. This is recommended every year. Tetanus, diphtheria, and acellular pertussis (Tdap, Td) vaccine. You may need a Td booster every 10 years. Zoster vaccine. You may need this after age 1. Pneumococcal 13-valent conjugate (PCV13) vaccine. One dose is recommended after age 53. Pneumococcal polysaccharide (PPSV23) vaccine. One dose is recommended after age 33. Talk to your health care provider about which screenings and vaccines you need and how often you need them. This information is not intended to replace advice given to you by your health care provider. Make sure you discuss any questions you have with your health care provider. Document Released: 06/09/2015 Document Revised: 01/31/2016 Document Reviewed: 03/14/2015 Elsevier Interactive Patient Education  2017 [employer].  Fall Prevention in the Home Falls can cause injuries. They can happen to people of all ages. There are many things you can do to make your home safe and to help prevent falls. What can I do on the outside of my home? Regularly fix the edges of walkways and driveways and fix any cracks. Remove anything that might make you trip as you walk through a door, such as a raised step or threshold. Trim any bushes or trees on the path to your home. Use bright outdoor lighting. Clear any walking paths of anything that might make someone trip, such as rocks or tools. Regularly check to see if handrails are loose or broken. Make sure that both sides of any steps have handrails. Any raised decks and porches should have guardrails on the  edges. Have any leaves, snow, or ice cleared regularly. Use sand or salt on walking paths during winter. Clean up any spills in your garage right away. This includes oil or grease spills. What can I do in the bathroom? Use night lights. Install grab bars by the toilet and in the tub and shower. Do not use towel bars as grab bars. Use non-skid mats or decals in the tub or shower. If you need to sit down in the shower, use a plastic, non-slip stool. Keep the floor dry. Clean up any water that spills on the floor as soon as it happens. Remove soap buildup in the tub or shower regularly. Attach bath mats securely with double-sided non-slip rug tape. Do not have throw rugs and other things on the floor that can make you trip. What can I do in the bedroom? Use night lights. Make sure that you have a light by your bed that is easy to reach. Do not use any sheets or blankets that are too big for your bed. They should not hang down onto the floor. Have a firm chair that has side arms. You can use this for support while you get  dressed. Do not have throw rugs and other things on the floor that can make you trip. What can I do in the kitchen? Clean up any spills right away. Avoid walking on wet floors. Keep items that you use a lot in easy-to-reach places. If you need to reach something above you, use a strong step stool that has a grab bar. Keep electrical cords out of the way. Do not use floor polish or wax that makes floors slippery. If you must use wax, use non-skid floor wax. Do not have throw rugs and other things on the floor that can make you trip. What can I do with my stairs? Do not leave any items on the stairs. Make sure that there are handrails on both sides of the stairs and use them. Fix handrails that are broken or loose. Make sure that handrails are as long as the stairways. Check any carpeting to make sure that it is firmly attached to the stairs. Fix any carpet that is loose or  worn. Avoid having throw rugs at the top or bottom of the stairs. If you do have throw rugs, attach them to the floor with carpet tape. Make sure that you have a light switch at the top of the stairs and the bottom of the stairs. If you do not have them, ask someone to add them for you. What else can I do to help prevent falls? Wear shoes that: Do not have high heels. Have rubber bottoms. Are comfortable and fit you well. Are closed at the toe. Do not wear sandals. If you use a stepladder: Make sure that it is fully opened. Do not climb a closed stepladder. Make sure that both sides of the stepladder are locked into place. Ask someone to hold it for you, if possible. Clearly mark and make sure that you can see: Any grab bars or handrails. First and last steps. Where the edge of each step is. Use tools that help you move around (mobility aids) if they are needed. These include: Canes. Walkers. Scooters. Crutches. Turn on the lights when you go into a dark area. Replace any light bulbs as soon as they burn out. Set up your furniture so you have a clear path. Avoid moving your furniture around. If any of your floors are uneven, fix them. If there are any pets around you, be aware of where they are. Review your medicines with your doctor. Some medicines can make you feel dizzy. This can increase your chance of falling. Ask your doctor what other things that you can do to help prevent falls. This information is not intended to replace advice given to you by your health care provider. Make sure you discuss any questions you have with your health care provider. Document Released: 03/09/2009 Document Revised: 10/19/2015 Document Reviewed: 06/17/2014 Elsevier Interactive Patient Education  2017 [employer].

## 2022-12-17 ENCOUNTER — Ambulatory Visit (INDEPENDENT_AMBULATORY_CARE_PROVIDER_SITE_OTHER): Payer: Medicare HMO | Admitting: Physician Assistant

## 2022-12-17 ENCOUNTER — Encounter: Payer: Self-pay | Admitting: Physician Assistant

## 2022-12-17 DIAGNOSIS — Z87891 Personal history of nicotine dependence: Secondary | ICD-10-CM

## 2022-12-17 NOTE — Patient Instructions (Signed)

## 2022-12-17 NOTE — Progress Notes (Signed)
Virtual Visit via Telephone Note  I connected with Brandon [name] on 12/17/22 at 1100 by telephone and verified that I am speaking with the correct person using two identifiers.  Location: Patient: home Provider: working virtually from home   I discussed the limitations, risks, security and privacy concerns of performing an evaluation and management service by telephone and the availability of in person appointments. I also discussed with the patient that there may be a patient responsible charge related to this service. The patient expressed understanding and agreed to proceed.       Shared Decision Making Visit Lung Cancer Screening Program [phone])   Eligibility: Age 72 Pack Years Smoking History Calculation 13 (# packs/per year x # years smoked) Recent History of coughing up blood  no Unexplained weight loss? No ( >Than 15 pounds within the last 6 months ) Prior History Lung / other cancer No (Diagnosis within the last 5 years already requiring surveillance chest CT Scans). Smoking Status Former Smoker Former Smokers: Years since quit: < 1 year  Quit Date: 2023  Visit Components: Discussion included one or more decision making aids? Yes Discussion included risk/benefits of screening. Yes Discussion included potential follow up diagnostic testing for abnormal scans. Yes Discussion included meaning and risk of over diagnosis. Yes Discussion included meaning and risk of False Positives. Yes Discussion included meaning of total radiation exposure. Yes  Counseling Included: Importance of adherence to annual lung cancer LDCT screening. Yes Impact of comorbidities on ability to participate in the program. Yes Ability and willingness to under diagnostic treatment. Yes  Smoking Cessation Counseling: Former Smokers:  Discussed the importance of maintaining cigarette abstinence. Yes Diagnosis Code: Personal History of Nicotine Dependence. O96.295 Information about tobacco  cessation classes and interventions provided to patient. Yes Written Order for Lung Cancer Screening with LDCT placed in Epic. Yes (CT Chest Lung Cancer Screening Low Dose W/O CM) [id number] Z12.2-Screening of respiratory organs Z87.891-Personal history of nicotine dependence    I spent 25 minutes of face to face time/virtual visit time  with the patient discussing the risks and benefits of lung cancer screening. We took the time to pause at intervals to allow for questions to be asked and answered to ensure understanding. We discussed that they had taken the single most powerful action possible to decrease their risk of developing lung cancer when they quit smoking. I counseled them to remain smoke free, and to contact the office if they ever had the desire to smoke again so that we can provide resources and tools to help support the effort to remain smoke free. We discussed the time and location of the scan, and they  will receive a call or letter with the results within  24-72 hours of receiving them. They have the office contact information in the event they have questions.   They verbalized understanding of all of the above and had no further questions.    I explained to the patient that there has been a high incidence of coronary artery disease noted on these exams. I explained that this is a non-gated exam therefore degree or severity cannot be determined. This patient is not on statin therapy. I have asked the patient to follow-up with their PCP regarding any incidental finding of coronary artery disease and management with diet or medication as they feel is clinically indicated. The patient verbalized understanding of the above and had no further questions.      [name], PA-C

## 2022-12-18 ENCOUNTER — Inpatient Hospital Stay: Admission: RE | Admit: 2022-12-18 | Payer: Medicare HMO | Source: Ambulatory Visit

## 2023-01-14 ENCOUNTER — Other Ambulatory Visit: Payer: Medicare HMO

## 2023-01-16 ENCOUNTER — Ambulatory Visit: Payer: Medicare HMO | Admitting: Internal Medicine

## 2023-05-05 DIAGNOSIS — M545 Low back pain, unspecified: Secondary | ICD-10-CM | POA: Diagnosis not present

## 2023-05-05 DIAGNOSIS — S39012A Strain of muscle, fascia and tendon of lower back, initial encounter: Secondary | ICD-10-CM | POA: Diagnosis not present

## 2023-05-05 DIAGNOSIS — M25569 Pain in unspecified knee: Secondary | ICD-10-CM | POA: Diagnosis not present

## 2023-05-05 DIAGNOSIS — Y92410 Unspecified street and highway as the place of occurrence of the external cause: Secondary | ICD-10-CM | POA: Diagnosis not present

## 2023-05-05 DIAGNOSIS — F17210 Nicotine dependence, cigarettes, uncomplicated: Secondary | ICD-10-CM | POA: Diagnosis not present

## 2023-05-05 DIAGNOSIS — M25561 Pain in right knee: Secondary | ICD-10-CM | POA: Diagnosis not present

## 2023-05-05 DIAGNOSIS — I10 Essential (primary) hypertension: Secondary | ICD-10-CM | POA: Diagnosis not present

## 2023-05-05 DIAGNOSIS — M25562 Pain in left knee: Secondary | ICD-10-CM | POA: Diagnosis not present

## 2023-05-05 DIAGNOSIS — Z743 Need for continuous supervision: Secondary | ICD-10-CM | POA: Diagnosis not present

## 2023-05-20 ENCOUNTER — Ambulatory Visit: Payer: Medicare HMO | Admitting: Family Medicine

## 2023-05-20 ENCOUNTER — Encounter: Payer: Self-pay | Admitting: Family Medicine

## 2023-05-20 VITALS — BP 140/90 | HR 58 | Temp 98.7°F | Ht 76.0 in | Wt 216.0 lb

## 2023-05-20 DIAGNOSIS — M25511 Pain in right shoulder: Secondary | ICD-10-CM
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
## 2023-05-26 ENCOUNTER — Ambulatory Visit: Payer: Medicare HMO

## 2023-06-30 ENCOUNTER — Ambulatory Visit (INDEPENDENT_AMBULATORY_CARE_PROVIDER_SITE_OTHER): Payer: Medicare HMO | Admitting: Family Medicine

## 2023-06-30 ENCOUNTER — Encounter: Payer: Self-pay | Admitting: Family Medicine

## 2023-06-30 VITALS — BP 128/78 | HR 69 | Ht 76.0 in | Wt 221.0 lb

## 2023-06-30 DIAGNOSIS — M25511 Pain in right shoulder: Secondary | ICD-10-CM | POA: Diagnosis not present

## 2023-06-30 DIAGNOSIS — R5383 Other fatigue: Secondary | ICD-10-CM
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
## 2023-07-02 LAB — CBC WITH DIFFERENTIAL/PLATELET
Absolute Lymphocytes: 2397 {cells}/uL (ref 850–3900)
Absolute Monocytes: 519 {cells}/uL (ref 200–950)
Basophils Absolute: 31 {cells}/uL (ref 0–200)
Basophils Relative: 0.5 %
Eosinophils Absolute: 79 {cells}/uL (ref 15–500)
Eosinophils Relative: 1.3 %
HCT: 39.6 % (ref 38.5–50.0)
Hemoglobin: 12.5 g/dL — ABNORMAL LOW (ref 13.2–17.1)
MCH: 28.4 pg (ref 27.0–33.0)
MCHC: 31.6 g/dL — ABNORMAL LOW (ref 32.0–36.0)
MCV: 90 fL (ref 80.0–100.0)
MPV: 9.5 fL (ref 7.5–12.5)
Monocytes Relative: 8.5 %
Neutro Abs: 3074 {cells}/uL (ref 1500–7800)
Neutrophils Relative %: 50.4 %
Platelets: 291 10*3/uL (ref 140–400)
RBC: 4.4 10*6/uL (ref 4.20–5.80)
RDW: 13.1 % (ref 11.0–15.0)
Total Lymphocyte: 39.3 %
WBC: 6.1 10*3/uL (ref 3.8–10.8)

## 2023-07-02 LAB — TESTOSTERONE TOTAL,FREE,BIO, MALES
Albumin: 4.3 g/dL (ref 3.6–5.1)
Sex Hormone Binding: 61 nmol/L (ref 22–77)
Testosterone, Bioavailable: 96.1 ng/dL (ref 15.0–150.0)
Testosterone, Free: 48.8 pg/mL (ref 6.0–73.0)
Testosterone: 605 ng/dL (ref 3.6–827)

## 2023-07-02 LAB — COMPLETE METABOLIC PANEL WITH GFR
AG Ratio: 1.5 (calc) (ref 1.0–2.5)
ALT: 9 U/L (ref 9–46)
AST: 17 U/L (ref 10–35)
Albumin: 4.3 g/dL (ref 3.6–5.1)
Alkaline phosphatase (APISO): 102 U/L (ref 35–144)
BUN: 20 mg/dL (ref 7–25)
CO2: 27 mmol/L (ref 20–32)
Calcium: 10.5 mg/dL — ABNORMAL HIGH (ref 8.6–10.3)
Chloride: 106 mmol/L (ref 98–110)
Creat: 1.11 mg/dL (ref 0.70–1.28)
Globulin: 2.9 g/dL (ref 1.9–3.7)
Glucose, Bld: 87 mg/dL (ref 65–99)
Potassium: 4.5 mmol/L (ref 3.5–5.3)
Sodium: 141 mmol/L (ref 135–146)
Total Bilirubin: 0.3 mg/dL (ref 0.2–1.2)
Total Protein: 7.2 g/dL (ref 6.1–8.1)
eGFR: 71 mL/min/{1.73_m2} (ref >=60)

## 2023-07-02 LAB — TSH: TSH: 0.63 m[IU]/L (ref 0.40–4.50)

## 2023-07-04 ENCOUNTER — Ambulatory Visit (HOSPITAL_COMMUNITY)
Admission: RE | Admit: 2023-07-04 | Discharge: 2023-07-04 | Disposition: A | Discharge status: Home / Self Care | Payer: Medicare HMO | Source: Ambulatory Visit | Attending: Family Medicine | Admitting: Family Medicine

## 2023-07-04 DIAGNOSIS — M19011 Primary osteoarthritis, right shoulder: Secondary | ICD-10-CM | POA: Diagnosis not present

## 2023-07-04 DIAGNOSIS — M25511 Pain in right shoulder: Secondary | ICD-10-CM | POA: Insufficient documentation

## 2023-12-18 ENCOUNTER — Ambulatory Visit: Payer: Medicare HMO | Admitting: *Deleted

## 2023-12-18 DIAGNOSIS — Z0000 Encounter for general adult medical examination without abnormal findings: Secondary | ICD-10-CM | POA: Diagnosis not present

## 2023-12-18 NOTE — Progress Notes (Signed)
 Subjective:   Brandon [name] is a 73 y.o. male who presents for Medicare Annual/Subsequent preventive examination.  Visit Complete: Virtual I connected with  Brandon [name] on 12/18/23 by a audio enabled telemedicine application and verified that I am speaking with the correct person using two identifiers.  Patient Location: Home  Provider Location: Home Office  I discussed the limitations of evaluation and management by telemedicine. The patient expressed understanding and agreed to proceed.  Vital Signs: Because this visit was a virtual/telehealth visit, some criteria may be missing or patient reported. Any vitals not documented were not able to be obtained and vitals that have been documented are patient reported.   Cardiac Risk Factors include: advanced age (>38men, >32 women);male gender     Objective:    There were no vitals filed for this visit. There is no height or weight on file to calculate BMI.     12/18/2023   10:49 AM 12/12/2022   11:54 AM 11/13/2022    1:16 PM 11/12/2022    3:57 PM 10/27/2022   10:33 PM 10/20/2022   12:12 AM 11/23/2021    2:53 PM  Advanced Directives  Does Patient Have a Medical Advance Directive? No No No No No No Yes  Type of Tax inspector;Living will  Copy of Healthcare Power of Attorney in Chart?       No - copy requested  Would patient like information on creating a medical advance directive? No - Patient declined Yes (MAU/Ambulatory/Procedural Areas - Information given)  No - Patient declined No - Patient declined No - Patient declined     Current Medications (verified) No outpatient encounter medications on file as of 12/18/2023.   No facility-administered encounter medications on file as of 12/18/2023.    Allergies (verified) Patient has no known allergies.   History: Past Medical History:  Diagnosis Date   Hypertension    Syphilis    Past Surgical History:  Procedure Laterality  Date   BACK SURGERY     4 yrs ago   BIOPSY  11/13/2022   Procedure: BIOPSY;  Surgeon: Burnette Fallow, MD;  Location: [facility];  Service: Gastroenterology;;   COLONOSCOPY  01/22/2012   Procedure: COLONOSCOPY;  Surgeon: Claudis RAYMOND Rivet, MD;  Location: [facility];  Service: Endoscopy;  Laterality: N/A;  200   ESOPHAGOGASTRODUODENOSCOPY (EGD) WITH PROPOFOL  Left 11/13/2022   Procedure: ESOPHAGOGASTRODUODENOSCOPY (EGD) WITH PROPOFOL ;  Surgeon: Burnette Fallow, MD;  Location: [facility];  Service: Gastroenterology;  Laterality: Left;   Family History  Problem Relation Age of Onset   Diabetes Mother    Hypertension Mother    Breast cancer Mother    Alcohol abuse Father    Aneurysm Father    Breast cancer Daughter    Colon cancer Neg Hx    Social History   Socioeconomic History   Marital status: Legally Separated    Spouse name: Not on file   Number of children: 3   Years of education: 12   Highest education level: High school graduate  Occupational History   Occupation: Retired- IT trainer  Tobacco Use   Smoking status: Every Day    Current packs/day: 1.00    Average packs/day: 1 pack/day for 51.0 years (51.0 ttl pk-yrs)    Types: Cigarettes   Smokeless tobacco: Never  Vaping Use   Vaping status: Never Used  Substance and Sexual Activity   Alcohol use: No   Drug use: No  Sexual activity: Yes    Birth control/protection: None  Other Topics Concern   Not on file  Social History Narrative   Grew up in [place], [place]. 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    Married for 17 years. Has 3 children, 11 grandchildren, 4 great grandchildren.   Wife had breast cancer and she died eight years ago. Remarried, wife is younger.    Wears seatbelt.   Eats all food groups.    Drinks 3-4 sodas per day.   Right-handed.   Social Drivers of Corporate investment banker Strain: Low Risk  (12/18/2023)   Overall Financial Resource Strain (CARDIA)    Difficulty of Paying Living  Expenses: Not hard at all  Food Insecurity: No Food Insecurity (12/18/2023)   Hunger Vital Sign    Worried About Running Out of Food in the Last Year: Never true    Ran Out of Food in the Last Year: Never true  Transportation Needs: No Transportation Needs (12/18/2023)   PRAPARE - Administrator, Civil Service (Medical): No    Lack of Transportation (Non-Medical): No  Physical Activity: Insufficiently Active (12/18/2023)   Exercise Vital Sign    Days of Exercise per Week: 3 days    Minutes of Exercise per Session: 30 min  Stress: No Stress Concern Present (12/18/2023)   [employer] of Occupational Health - Occupational Stress Questionnaire    Feeling of Stress: Not at all  Social Connections: Socially Isolated (12/18/2023)   Social Connection and Isolation Panel    Frequency of Communication with Friends and Family: More than three times a week    Frequency of Social Gatherings with Friends and Family: More than three times a week    Attends Religious Services: Never    Database administrator or Organizations: No    Attends Engineer, structural: Never    Marital Status: Separated    Tobacco Counseling Ready to quit: Not Answered Counseling given: Not Answered   Clinical Intake:  Pre-visit preparation completed: Yes  Pain : No/denies pain     Diabetes: No  How often do you need to have someone help you when you read instructions, pamphlets, or other written materials from your doctor or pharmacy?: 1 - Never  Interpreter Needed?: No  Information entered by :: Mliss Graff LPN   Activities of Daily Living    12/18/2023   10:51 AM  In your present state of health, do you have any difficulty performing the following activities:  Hearing? 0  Vision? 0  Difficulty concentrating or making decisions? 0  Walking or climbing stairs? 0  Dressing or bathing? 0  Doing errands, shopping? 0  Preparing Food and eating ? N  Using the Toilet? N  In the  past six months, have you accidently leaked urine? N  Do you have problems with loss of bowel control? N  Managing your Medications? N  Managing your Finances? N  Housekeeping or managing your Housekeeping? N    Patient Care Team: Duanne Butler DASEN, MD as PCP - General (Family Medicine) Valdemar Rogue, MD as Consulting Physician (Ophthalmology)  Indicate any recent Medical Services you may have received from other than [facility] providers in the past year (date may be approximate).     Assessment:   This is a routine wellness examination for [place].  Hearing/Vision screen Hearing Screening - Comments:: No trouble hearing Vision Screening - Comments:: Unsure of name Up to date  Goals Addressed   None    Depression Screen    12/18/2023   11:00 AM 05/20/2023   12:18 PM 12/12/2022   11:54 AM 12/03/2022   12:12 PM 11/23/2021    2:52 PM 04/08/2018    2:51 PM 01/27/2018    1:38 PM  PHQ 2/9 Scores  PHQ - 2 Score 0 0 0 0 0 0 0  PHQ- 9 Score 0 0     2    Fall Risk    12/18/2023   10:49 AM 12/12/2022   11:55 AM 12/03/2022   12:12 PM 11/23/2021    2:53 PM 04/08/2018    2:51 PM  Fall Risk   Falls in the past year? 0 0 0 0 0   Number falls in past yr: 0 0 0 0   Injury with Fall? 0 0 0 0   Risk for fall due to :  No Fall Risks No Fall Risks No Fall Risks   Follow up Falls evaluation completed;Education provided;Falls prevention discussed Falls prevention discussed;Education provided;Falls evaluation completed Falls prevention discussed Falls prevention discussed  Falls evaluation completed      Data saved with a previous flowsheet row definition    MEDICARE RISK AT HOME: Medicare Risk at Home Any stairs in or around the home?: No If so, are there any without handrails?: No Home free of loose throw rugs in walkways, pet beds, electrical cords, etc?: Yes Adequate lighting in your home to reduce risk of falls?: Yes Life alert?: No Use of a cane, walker or w/c?: No Grab bars in the  bathroom?: No Shower chair or bench in shower?: No Elevated toilet seat or a handicapped toilet?: No  TIMED UP AND GO:  Was the test performed?  No    Cognitive Function:        12/18/2023   10:51 AM 12/12/2022   11:55 AM 11/23/2021    2:54 PM  6CIT Screen  What Year? 0 points 0 points 0 points  What month? 3 points 0 points 0 points  What time? 0 points 0 points 0 points  Count back from 20 0 points 0 points 0 points  Months in reverse 2 points 2 points 2 points  Repeat phrase 0 points 0 points 0 points  Total Score 5 points 2 points 2 points    Immunizations Immunization History  Administered Date(s) Administered   Influenza,inj,Quad PF,6+ Mos 01/13/2018   PFIZER(Purple Top)SARS-COV-2 Vaccination 08/12/2019, 09/07/2019   Tdap 09/05/2017    TDAP status: Up to date  Flu Vaccine status: Up to date  Pneumococcal vaccine status: Due, Education has been provided regarding the importance of this vaccine. Advised may receive this vaccine at local pharmacy or Health Dept. Aware to provide a copy of the vaccination record if obtained from local pharmacy or Health Dept. Verbalized acceptance and understanding.  Covid-19 vaccine status: Information provided on how to obtain vaccines.   Qualifies for Shingles Vaccine? Yes   Zostavax completed No   Shingrix Completed?: No.    Education has been provided regarding the importance of this vaccine. Patient has been advised to call insurance company to determine out of pocket expense if they have not yet received this vaccine. Advised may also receive vaccine at local pharmacy or Health Dept. Verbalized acceptance and understanding.  Screening Tests Health Maintenance  Topic Date Due   Pneumococcal Vaccine: 50+ Years (1 of 2 - PCV) Never done   Lung Cancer Screening  Never done   Zoster Vaccines-  Shingrix (1 of 2) Never done   Colonoscopy  01/21/2022   COVID-19 Vaccine (3 - 2024-25 season) 01/26/2023   INFLUENZA VACCINE  12/26/2023    Medicare Annual Wellness (AWV)  12/17/2024   DTaP/Tdap/Td (2 - Td or Tdap) 09/06/2027   Hepatitis C Screening  Completed   Hepatitis B Vaccines  Aged Out   HPV VACCINES  Aged Out   Meningococcal B Vaccine  Aged Out    Health Maintenance  Health Maintenance Due  Topic Date Due   Pneumococcal Vaccine: 50+ Years (1 of 2 - PCV) Never done   Lung Cancer Screening  Never done   Zoster Vaccines- Shingrix (1 of 2) Never done   Colonoscopy  01/21/2022   COVID-19 Vaccine (3 - 2024-25 season) 01/26/2023    Colonoscopy   Education provided  Lung Cancer Screening: (Low Dose CT Chest recommended if Age 33-80 years, 20 pack-year currently smoking OR have quit w/in 15years.) does qualify.   Lung Cancer Screening Referral: Education provided  Additional Screening:  Hepatitis C Screening: does not qualify; Completed 2019  Vision Screening: Recommended annual ophthalmology exams for early detection of glaucoma and other disorders of the eye. Is the patient up to date with their annual eye exam?  Yes  Who is the provider or what is the name of the office in which the patient attends annual eye exams? Unsure of name If pt is not established with a provider, would they like to be referred to a provider to establish care? No .   Dental Screening: Recommended annual dental exams for proper oral hygiene    Community Resource Referral / Chronic Care Management: CRR required this visit?  No   CCM required this visit?  No     Plan:     I have personally reviewed and noted the following in the patient's chart:   Medical and social history Use of alcohol, tobacco or illicit drugs  Current medications and supplements including opioid prescriptions. Patient is not currently taking opioid prescriptions. Functional ability and status Nutritional status Physical activity Advanced directives List of other physicians Hospitalizations, surgeries, and ER visits in previous 12  months Vitals Screenings to include cognitive, depression, and falls Referrals and appointments  In addition, I have reviewed and discussed with patient certain preventive protocols, quality metrics, and best practice recommendations. A written personalized care plan for preventive services as well as general preventive health recommendations were provided to patient.     Mliss Graff, LPN   [id number]   After Visit Summary: (MyChart) Due to this being a telephonic visit, the after visit summary with patients personalized plan was offered to patient via MyChart   Nurse Notes:

## 2023-12-18 NOTE — Patient Instructions (Signed)
 Brandon [name] , Thank you for taking time to come for your Medicare Wellness Visit. I appreciate your ongoing commitment to your health goals. Please review the following plan we discussed and let me know if I can assist you in the future.   Screening recommendations/referrals: Colonoscopy:  Recommended yearly ophthalmology/optometry visit for glaucoma screening and checkup Recommended yearly dental visit for hygiene and checkup  Vaccinations: Influenza vaccine:  Pneumococcal vaccine:  Tdap vaccine:  Shingles vaccine:       Preventive Care 65 Years and Older, Male Preventive care refers to lifestyle choices and visits with your health care provider that can promote health and wellness. What does preventive care include? A yearly physical exam. This is also called an annual well check. Dental exams once or twice a year. Routine eye exams. Ask your health care provider how often you should have your eyes checked. Personal lifestyle choices, including: Daily care of your teeth and gums. Regular physical activity. Eating a healthy diet. Avoiding tobacco and drug use. Limiting alcohol use. Practicing safe sex. Taking low doses of aspirin every day. Taking vitamin and mineral supplements as recommended by your health care provider. What happens during an annual well check? The services and screenings done by your health care provider during your annual well check will depend on your age, overall health, lifestyle risk factors, and family history of disease. Counseling  Your health care provider may ask you questions about your: Alcohol use. Tobacco use. Drug use. Emotional well-being. Home and relationship well-being. Sexual activity. Eating habits. History of falls. Memory and ability to understand (cognition). Work and work Astronomer. Screening  You may have the following tests or measurements: Height, weight, and BMI. Blood pressure. Lipid and cholesterol levels. These  may be checked every 5 years, or more frequently if you are over 16 years old. Skin check. Lung cancer screening. You may have this screening every year starting at age 61 if you have a 30-pack-year history of smoking and currently smoke or have quit within the past 15 years. Fecal occult blood test (FOBT) of the stool. You may have this test every year starting at age 38. Flexible sigmoidoscopy or colonoscopy. You may have a sigmoidoscopy every 5 years or a colonoscopy every 10 years starting at age 4. Prostate cancer screening. Recommendations will vary depending on your family history and other risks. Hepatitis C blood test. Hepatitis B blood test. Sexually transmitted disease (STD) testing. Diabetes screening. This is done by checking your blood sugar (glucose) after you have not eaten for a while (fasting). You may have this done every 1-3 years. Abdominal aortic aneurysm (AAA) screening. You may need this if you are a current or former smoker. Osteoporosis. You may be screened starting at age 22 if you are at high risk. Talk with your health care provider about your test results, treatment options, and if necessary, the need for more tests. Vaccines  Your health care provider may recommend certain vaccines, such as: Influenza vaccine. This is recommended every year. Tetanus, diphtheria, and acellular pertussis (Tdap, Td) vaccine. You may need a Td booster every 10 years. Zoster vaccine. You may need this after age 85. Pneumococcal 13-valent conjugate (PCV13) vaccine. One dose is recommended after age 55. Pneumococcal polysaccharide (PPSV23) vaccine. One dose is recommended after age 63. Talk to your health care provider about which screenings and vaccines you need and how often you need them. This information is not intended to replace advice given to you by your health care  provider. Make sure you discuss any questions you have with your health care provider. Document Released:  06/09/2015 Document Revised: 01/31/2016 Document Reviewed: 03/14/2015 Elsevier Interactive Patient Education  2017 [employer].  Fall Prevention in the Home Falls can cause injuries. They can happen to people of all ages. There are many things you can do to make your home safe and to help prevent falls. What can I do on the outside of my home? Regularly fix the edges of walkways and driveways and fix any cracks. Remove anything that might make you trip as you walk through a door, such as a raised step or threshold. Trim any bushes or trees on the path to your home. Use bright outdoor lighting. Clear any walking paths of anything that might make someone trip, such as rocks or tools. Regularly check to see if handrails are loose or broken. Make sure that both sides of any steps have handrails. Any raised decks and porches should have guardrails on the edges. Have any leaves, snow, or ice cleared regularly. Use sand or salt on walking paths during winter. Clean up any spills in your garage right away. This includes oil or grease spills. What can I do in the bathroom? Use night lights. Install grab bars by the toilet and in the tub and shower. Do not use towel bars as grab bars. Use non-skid mats or decals in the tub or shower. If you need to sit down in the shower, use a plastic, non-slip stool. Keep the floor dry. Clean up any water  that spills on the floor as soon as it happens. Remove soap buildup in the tub or shower regularly. Attach bath mats securely with double-sided non-slip rug tape. Do not have throw rugs and other things on the floor that can make you trip. What can I do in the bedroom? Use night lights. Make sure that you have a light by your bed that is easy to reach. Do not use any sheets or blankets that are too big for your bed. They should not hang down onto the floor. Have a firm chair that has side arms. You can use this for support while you get dressed. Do not have  throw rugs and other things on the floor that can make you trip. What can I do in the kitchen? Clean up any spills right away. Avoid walking on wet floors. Keep items that you use a lot in easy-to-reach places. If you need to reach something above you, use a strong step stool that has a grab bar. Keep electrical cords out of the way. Do not use floor polish or wax that makes floors slippery. If you must use wax, use non-skid floor wax. Do not have throw rugs and other things on the floor that can make you trip. What can I do with my stairs? Do not leave any items on the stairs. Make sure that there are handrails on both sides of the stairs and use them. Fix handrails that are broken or loose. Make sure that handrails are as long as the stairways. Check any carpeting to make sure that it is firmly attached to the stairs. Fix any carpet that is loose or worn. Avoid having throw rugs at the top or bottom of the stairs. If you do have throw rugs, attach them to the floor with carpet tape. Make sure that you have a light switch at the top of the stairs and the bottom of the stairs. If you do not have  them, ask someone to add them for you. What else can I do to help prevent falls? Wear shoes that: Do not have high heels. Have rubber bottoms. Are comfortable and fit you well. Are closed at the toe. Do not wear sandals. If you use a stepladder: Make sure that it is fully opened. Do not climb a closed stepladder. Make sure that both sides of the stepladder are locked into place. Ask someone to hold it for you, if possible. Clearly mark and make sure that you can see: Any grab bars or handrails. First and last steps. Where the edge of each step is. Use tools that help you move around (mobility aids) if they are needed. These include: Canes. Walkers. Scooters. Crutches. Turn on the lights when you go into a dark area. Replace any light bulbs as soon as they burn out. Set up your furniture so  you have a clear path. Avoid moving your furniture around. If any of your floors are uneven, fix them. If there are any pets around you, be aware of where they are. Review your medicines with your doctor. Some medicines can make you feel dizzy. This can increase your chance of falling. Ask your doctor what other things that you can do to help prevent falls. This information is not intended to replace advice given to you by your health care provider. Make sure you discuss any questions you have with your health care provider. Document Released: 03/09/2009 Document Revised: 10/19/2015 Document Reviewed: 06/17/2014 Elsevier Interactive Patient Education  2017 [employer].

## 2024-03-11 DIAGNOSIS — F324 Major depressive disorder, single episode, in partial remission: Secondary | ICD-10-CM | POA: Diagnosis not present

## 2024-03-11 DIAGNOSIS — N182 Chronic kidney disease, stage 2 (mild): Secondary | ICD-10-CM | POA: Diagnosis not present

## 2024-03-11 DIAGNOSIS — Z87891 Personal history of nicotine dependence: Secondary | ICD-10-CM | POA: Diagnosis not present

## 2024-03-11 DIAGNOSIS — R030 Elevated blood-pressure reading, without diagnosis of hypertension: Secondary | ICD-10-CM | POA: Diagnosis not present

## 2024-12-23 ENCOUNTER — Encounter
